# Patient Record
Sex: Female | Born: 1985 | Race: White | Hispanic: No | Smoking: Former smoker
Health system: Southern US, Community
[De-identification: ages and names within clinical notes are randomized; demographics above are authoritative.]

## PROBLEM LIST (undated history)

## (undated) DIAGNOSIS — O99119 Other diseases of the blood and blood-forming organs and certain disorders involving the immune mechanism complicating pregnancy, unspecified trimester: Secondary | ICD-10-CM

## (undated) DIAGNOSIS — D696 Thrombocytopenia, unspecified: Secondary | ICD-10-CM

## (undated) DIAGNOSIS — R4586 Emotional lability: Secondary | ICD-10-CM

## (undated) DIAGNOSIS — R569 Unspecified convulsions: Secondary | ICD-10-CM

## (undated) DIAGNOSIS — E079 Disorder of thyroid, unspecified: Secondary | ICD-10-CM

## (undated) DIAGNOSIS — E063 Autoimmune thyroiditis: Secondary | ICD-10-CM

## (undated) DIAGNOSIS — E039 Hypothyroidism, unspecified: Secondary | ICD-10-CM

## (undated) HISTORY — DX: Thrombocytopenia, unspecified: D69.6

## (undated) HISTORY — PX: NO PAST SURGERIES: SHX2092

## (undated) HISTORY — DX: Thrombocytopenia, unspecified: O99.119

## (undated) HISTORY — DX: Hypothyroidism, unspecified: E03.9

## (undated) HISTORY — DX: Autoimmune thyroiditis: E06.3

---

## 2005-11-06 ENCOUNTER — Emergency Department: Payer: Self-pay | Admitting: Emergency Medicine

## 2007-04-27 ENCOUNTER — Emergency Department: Payer: Self-pay | Admitting: Emergency Medicine

## 2007-08-13 ENCOUNTER — Emergency Department: Payer: Self-pay | Admitting: Internal Medicine

## 2010-03-14 ENCOUNTER — Emergency Department: Payer: Self-pay | Admitting: Emergency Medicine

## 2010-03-18 ENCOUNTER — Emergency Department: Payer: Self-pay | Admitting: Emergency Medicine

## 2010-07-29 ENCOUNTER — Ambulatory Visit: Payer: Self-pay | Admitting: Internal Medicine

## 2010-08-13 ENCOUNTER — Emergency Department: Payer: Self-pay | Admitting: Emergency Medicine

## 2011-02-15 ENCOUNTER — Ambulatory Visit: Payer: Self-pay | Admitting: Internal Medicine

## 2011-06-12 ENCOUNTER — Emergency Department: Payer: Self-pay | Admitting: Emergency Medicine

## 2011-12-12 ENCOUNTER — Inpatient Hospital Stay: Payer: Self-pay

## 2011-12-12 LAB — CBC WITH DIFFERENTIAL/PLATELET
Basophil %: 1.4 %
Eosinophil #: 0.1 10*3/uL (ref 0.0–0.7)
Eosinophil %: 1.1 %
HCT: 38.4 % (ref 35.0–47.0)
Lymphocyte #: 1.5 10*3/uL (ref 1.0–3.6)
MCH: 31.8 pg (ref 26.0–34.0)
MCHC: 34.4 g/dL (ref 32.0–36.0)
MCV: 92 fL (ref 80–100)
Monocyte #: 0.7 x10 3/mm (ref 0.2–0.9)
Monocyte %: 8.1 %
Neutrophil #: 6 10*3/uL (ref 1.4–6.5)
Platelet: 120 10*3/uL — ABNORMAL LOW (ref 150–440)
RBC: 4.17 10*6/uL (ref 3.80–5.20)
RDW: 13.3 % (ref 11.5–14.5)

## 2011-12-14 LAB — HEMATOCRIT: HCT: 31.3 % — ABNORMAL LOW (ref 35.0–47.0)

## 2013-02-16 ENCOUNTER — Emergency Department: Payer: Self-pay | Admitting: Emergency Medicine

## 2013-02-16 LAB — CBC
HCT: 39.7 % (ref 35.0–47.0)
HGB: 13.8 g/dL (ref 12.0–16.0)
MCHC: 34.8 g/dL (ref 32.0–36.0)
RBC: 4.59 10*6/uL (ref 3.80–5.20)
RDW: 12.7 % (ref 11.5–14.5)

## 2013-02-16 LAB — COMPREHENSIVE METABOLIC PANEL
Alkaline Phosphatase: 82 U/L (ref 50–136)
BUN: 14 mg/dL (ref 7–18)
Co2: 23 mmol/L (ref 21–32)
Creatinine: 0.89 mg/dL (ref 0.60–1.30)
EGFR (Non-African Amer.): >60
Glucose: 95 mg/dL (ref 65–99)
Potassium: 3.7 mmol/L (ref 3.5–5.1)
Sodium: 135 mmol/L — ABNORMAL LOW (ref 136–145)

## 2013-02-16 LAB — URINALYSIS, COMPLETE
Granular Cast: 3
RBC,UR: 16 /HPF (ref 0–5)
Specific Gravity: 1.02 (ref 1.003–1.030)
Squamous Epithelial: 10

## 2013-11-02 ENCOUNTER — Emergency Department: Payer: Self-pay | Admitting: Emergency Medicine

## 2013-11-02 LAB — URINALYSIS, COMPLETE
BILIRUBIN, UR: NEGATIVE
GLUCOSE, UR: NEGATIVE mg/dL (ref 0–75)
Ketone: NEGATIVE
NITRITE: NEGATIVE
Ph: 7 (ref 4.5–8.0)
Protein: NEGATIVE
SPECIFIC GRAVITY: 1.019 (ref 1.003–1.030)
WBC UR: 38 /HPF (ref 0–5)

## 2013-11-02 LAB — COMPREHENSIVE METABOLIC PANEL
ALK PHOS: 62 U/L
ANION GAP: 3 — AB (ref 7–16)
Albumin: 3.9 g/dL (ref 3.4–5.0)
BUN: 17 mg/dL (ref 7–18)
Bilirubin,Total: 0.4 mg/dL (ref 0.2–1.0)
CALCIUM: 8.6 mg/dL (ref 8.5–10.1)
CHLORIDE: 108 mmol/L — AB (ref 98–107)
CREATININE: 0.59 mg/dL — AB (ref 0.60–1.30)
Co2: 26 mmol/L (ref 21–32)
EGFR (African American): >60
Glucose: 90 mg/dL (ref 65–99)
OSMOLALITY: 275 (ref 275–301)
Potassium: 4.2 mmol/L (ref 3.5–5.1)
SGOT(AST): 24 U/L (ref 15–37)
SGPT (ALT): 25 U/L (ref 12–78)
SODIUM: 137 mmol/L (ref 136–145)
Total Protein: 8.2 g/dL (ref 6.4–8.2)

## 2013-11-02 LAB — CBC WITH DIFFERENTIAL/PLATELET
BASOS ABS: 0 10*3/uL (ref 0.0–0.1)
Basophil %: 0.7 %
EOS ABS: 0.1 10*3/uL (ref 0.0–0.7)
EOS PCT: 1.1 %
HCT: 39 % (ref 35.0–47.0)
HGB: 12.4 g/dL (ref 12.0–16.0)
LYMPHS PCT: 25.3 %
Lymphocyte #: 1.8 10*3/uL (ref 1.0–3.6)
MCH: 27.6 pg (ref 26.0–34.0)
MCHC: 31.9 g/dL — ABNORMAL LOW (ref 32.0–36.0)
MCV: 87 fL (ref 80–100)
Monocyte #: 0.5 x10 3/mm (ref 0.2–0.9)
Monocyte %: 7 %
Neutrophil #: 4.7 10*3/uL (ref 1.4–6.5)
Neutrophil %: 65.9 %
PLATELETS: 164 10*3/uL (ref 150–440)
RBC: 4.49 10*6/uL (ref 3.80–5.20)
RDW: 14.1 % (ref 11.5–14.5)
WBC: 7.2 10*3/uL (ref 3.6–11.0)

## 2013-11-04 LAB — URINE CULTURE

## 2014-01-01 ENCOUNTER — Emergency Department: Payer: Self-pay | Admitting: Emergency Medicine

## 2014-07-11 IMAGING — CT CT STONE STUDY
1 of 2 series · 11 of 16 positions shown, 14 images · non-contrast
Comparison: None

REASON FOR EXAM: left flank pain
COMMENTS:

PROCEDURE:     CT  - CT ABDOMEN /PELVIS WO (STONE)  - February 16, 2013  [DATE]
RESULT:     Indication: Flank Pain
TECHNIQUE: Multiple axial images from the lung bases to the symphysis pubis
were obtained without oral and without intravenous contrast.

[Series 2: soft tissue · axial · 0.70mm/px · z∈[-920,-532]mm · 11 of 155 slices shown, 14 images]
[im 13/155  soft-tissue]
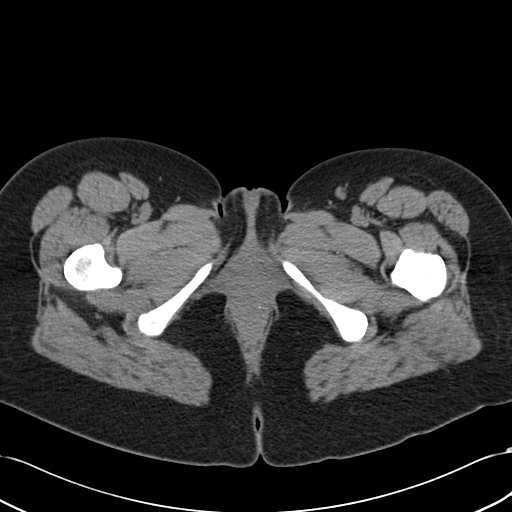
[im 13/155  bone]
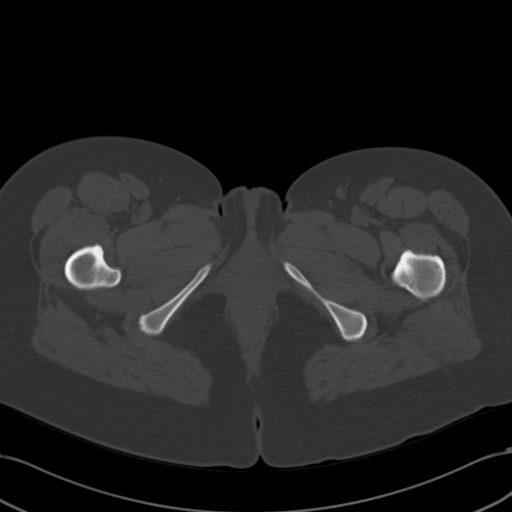
[im 26/155  soft-tissue]
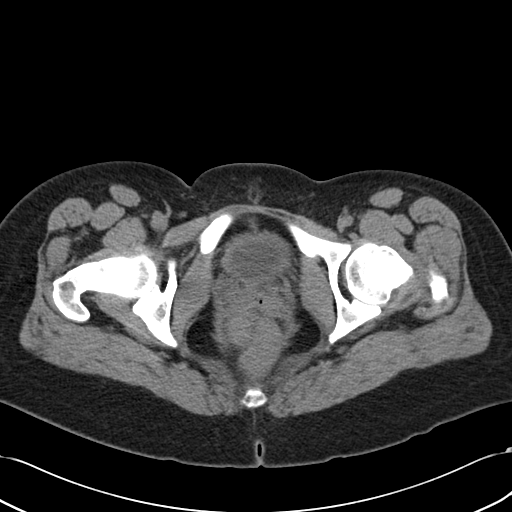
[im 39/155  soft-tissue]
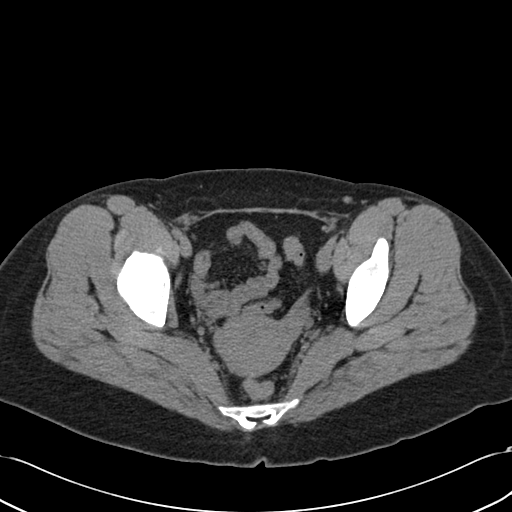
[im 52/155  soft-tissue]
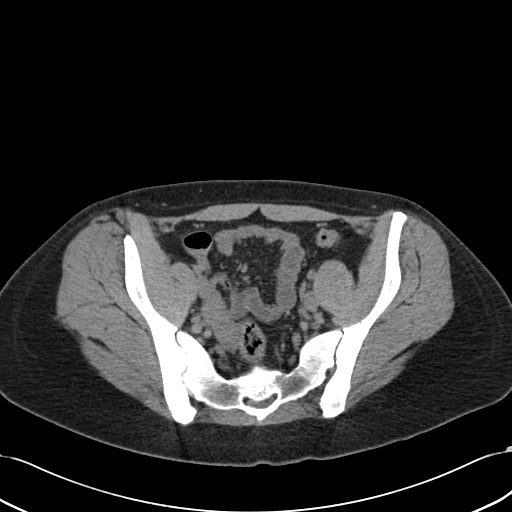
[im 65/155  soft-tissue]
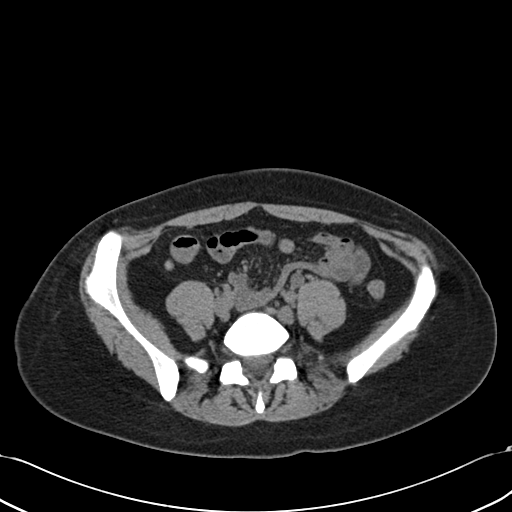
[im 65/155  bone]
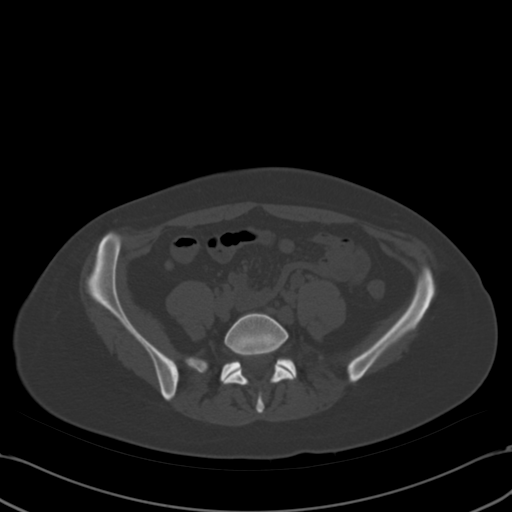
[im 78/155  soft-tissue]
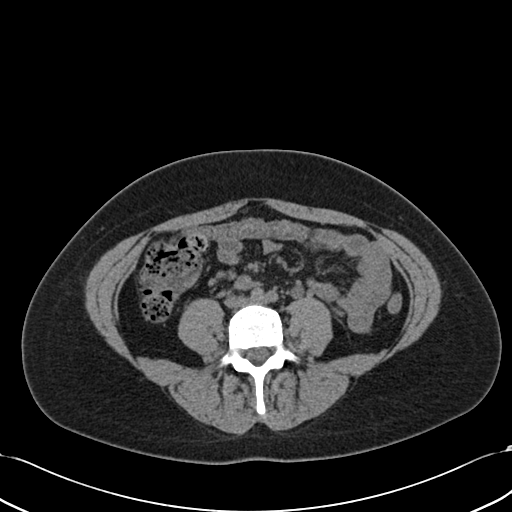
[im 90/155  soft-tissue]
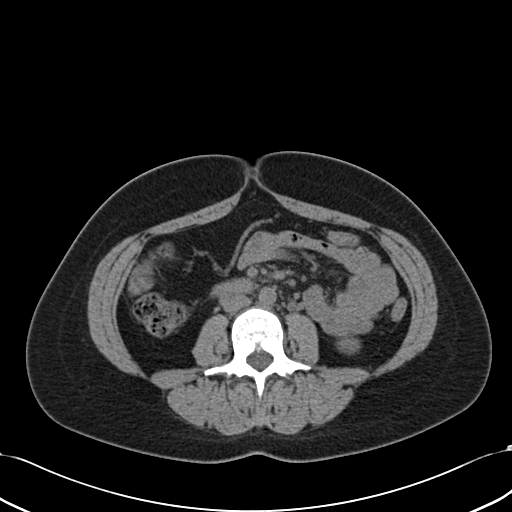
[im 103/155  soft-tissue]
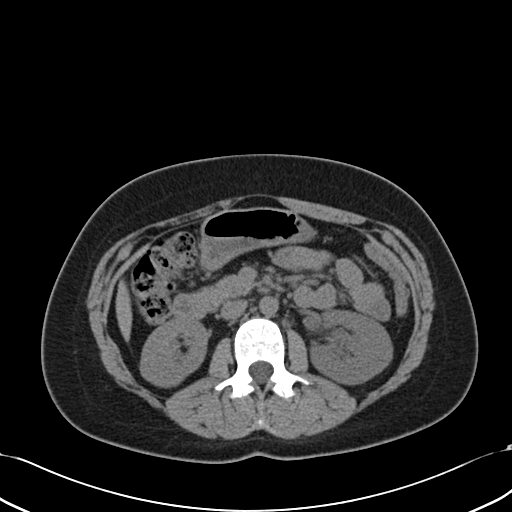
[im 116/155  soft-tissue]
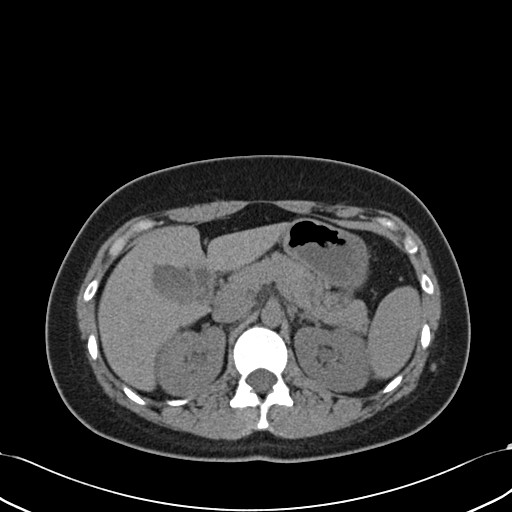
[im 116/155  bone]
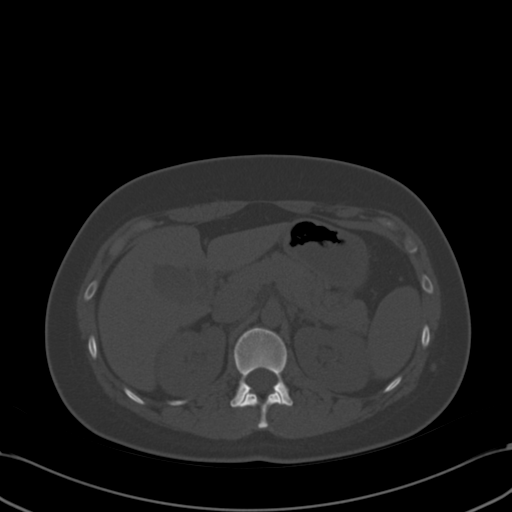
[im 129/155  soft-tissue]
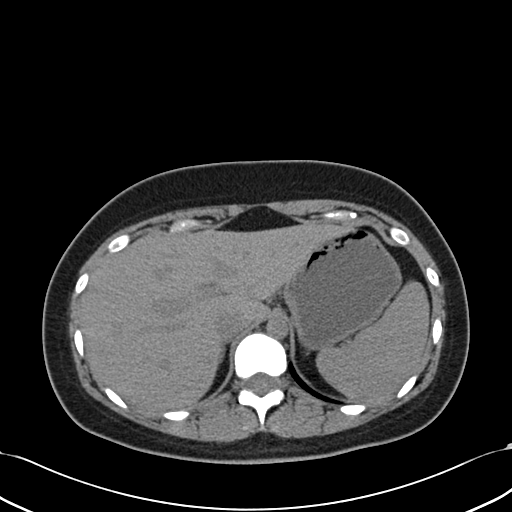
[im 142/155  soft-tissue]
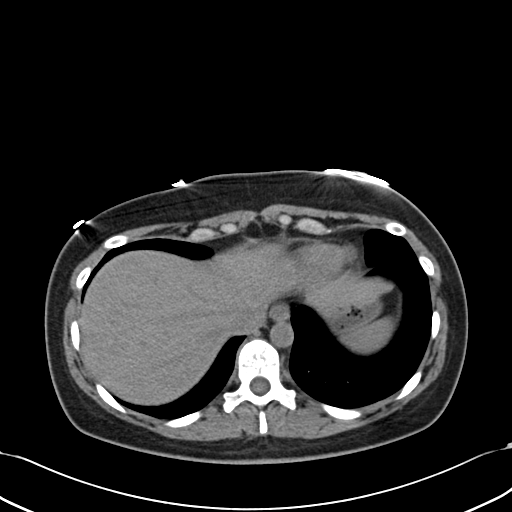

[11 of 16 positions shown; findings below may reference images not displayed]

FINDINGS: The lung bases are clear. There is no pleural or pericardial effusions.

There is a 2 mm left UVJ calculus resulting in mild left
hydroureteronephrosis. No perinephric stranding is seen. The kidneys are
symmetric in size without evidence for exophytic mass. The bladder is
unremarkable.

The liver demonstrates no focal abnormality. The gallbladder is
unremarkable. The spleen demonstrates no focal abnormality. The adrenal
glands and pancreas are normal.

The unopacified stomach, duodenum, small intestine, and large intestine are
unremarkable, but evaluation is limited by lack of oral contrast. There is a
normal caliber appendix in the right lower quadrant without periappendiceal
inflammatory changes. There is no pneumoperitoneum, pneumatosis, or portal
venous gas. There is no abdominal or pelvic free fluid. There is no
lymphadenopathy.

The abdominal aorta is normal in caliber .

The osseous structures are unremarkable.
IMPRESSION: 1. There is a 2 mm left UVJ calculus resulting in mild left
hydroureteronephrosis.

[REDACTED]

## 2014-12-03 NOTE — H&P (Signed)
L&D Evaluation:  History Expanded:   HPI 29 yo G2P0010 WF at 37 weeks w estimated date of confinement 12/27/11 in early labor, contractions.  No ROM or vaginal bleeding.    Gravida 2    Term 0    PreTerm 0    Abortion 1    Living 0    Blood Type B positive    Presents with contractions    Patient's Medical History No Chronic Illness    Patient's Surgical History none    Medications Pre Natal Vitamins    Allergies NKDA    Social History none    Family History Non-Contributory   ROS:   ROS All systems were reviewed.  HEENT, CNS, GI, GU, Respiratory, CV, Renal and Musculoskeletal systems were found to be normal.   Exam:   Vital Signs stable    General no apparent distress    Mental Status clear    Chest clear    Heart normal sinus rhythm    Abdomen gravid, non-tender    Estimated Fetal Weight Average for gestational age    Back no CVAT    Edema no edema    Pelvic no external lesions, 3/80    Mebranes Intact    Ucx regular    Skin dry   Impression:   Impression early labor   Plan:   Plan EFM/NST, monitor contractions and for cervical change   Electronic Signatures: Letitia LibraHarris, Klaryssa Fauth Paul (MD)  (Signed 19-May-13 15:20)  Authored: L&D Evaluation   Last Updated: 19-May-13 15:20 by Letitia LibraHarris, Doriann Zuch Paul (MD)

## 2014-12-07 ENCOUNTER — Ambulatory Visit
Admission: EM | Admit: 2014-12-07 | Discharge: 2014-12-07 | Disposition: A | Discharge status: Home / Self Care | Payer: Medicaid Other | Attending: Family Medicine | Admitting: Family Medicine

## 2014-12-07 ENCOUNTER — Encounter: Payer: Self-pay | Admitting: Gynecology

## 2014-12-07 DIAGNOSIS — S80861A Insect bite (nonvenomous), right lower leg, initial encounter: Secondary | ICD-10-CM | POA: Diagnosis not present

## 2014-12-07 DIAGNOSIS — W57XXXA Bitten or stung by nonvenomous insect and other nonvenomous arthropods, initial encounter: Secondary | ICD-10-CM | POA: Diagnosis not present

## 2014-12-07 HISTORY — DX: Unspecified convulsions: R56.9

## 2014-12-07 NOTE — ED Provider Notes (Signed)
CSN: [id number]     Arrival [id number] & time 12/07/14  1357 History   First MD Initiated Contact with Patient 12/07/14 1453     Chief Complaint  Patient presents with  . Insect Bite   (Consider location/radiation/quality/duration/timing/severity/associated sxs/prior Treatment) HPI     29 year old female presents complaining of a possible insect bite in the right popliteal space. She initially noticed a red bump about a week ago. This became red, swollen, and painful. 2 days ago started getting better. Now the rash is almost gone. No systemic symptoms. She says earlier there was a bruise with red bumps surrounding it, she did research on the Internet and thinks that she might have Lyme disease. Also she says her toes hurt from her tight shoes, she is wondering if that has anything to do with the insect bite  Past Medical History  Diagnosis Date  . Seizures     as a child   History reviewed. No pertinent past surgical history. No family history on file. History  Substance Use Topics  . Smoking status: Current Every Day Smoker    Types: Cigarettes  . Smokeless tobacco: Not on file  . Alcohol Use: No   OB History    No data available     Review of Systems  Skin: Positive for rash.  All other systems reviewed and are negative.   Allergies  Review of patient's allergies indicates no known allergies.  Home Medications   Prior to Admission medications   Medication Sig Start Date End Date Taking? Authorizing Provider  norgestimate-ethinyl estradiol (ORTHO-CYCLEN,SPRINTEC,PREVIFEM) 0.25-35 MG-MCG tablet Take 1 tablet by mouth daily.   Yes Historical Provider, MD   BP 115/78 mmHg  Pulse 98  Temp(Src) 97.9 F (36.6 C) (Tympanic)  Ht 5\' 3"  (1.6 m)  Wt 125 lb (56.7 kg)  BMI 22.15 kg/m2  SpO2 98%  LMP 11/21/2014 Physical Exam  Constitutional: She is oriented to person, place, and time. Vital signs are normal. She appears well-developed and well-nourished. No distress.  HENT:  Head:  Normocephalic and atraumatic.  Pulmonary/Chest: Effort normal. No respiratory distress.  Musculoskeletal:  Bilateral subungual hematomas of the great toes, unknown chronicity  Neurological: She is alert and oriented to person, place, and time. She has normal strength. Coordination normal.  Skin: Skin is warm and dry. Rash (in the right popliteal space there is a rash consisting of a few scabbed papules with a central 3 mm erythematous papule) noted. She is not diaphoretic.  Psychiatric: She has a normal mood and affect. Judgment normal.  Nursing note and vitals reviewed.   ED Course  Procedures (including critical care time) Labs Review Labs Reviewed - No data to display  Imaging Review No results found.   MDM   1. Insect bite of leg, right, initial encounter    This is an insect bite that is healing, no intervention indicated at this time. This does not have the appearance of erythema migrans. I do not think this has anything to do with toe pain but she is advised that she starts to have more bruising that she should come in to be evaluated. I have offered to send Borrelia antibodies but she declines. She will follow-up if the rash worsens or she starts to get sick in any way      Graylon GoodZachary H Laiken Sandy, PA-C 12/07/14 1650

## 2014-12-07 NOTE — Discharge Instructions (Signed)

## 2014-12-07 NOTE — ED Notes (Signed)
Patient c/o question insect bite on right posterior of patella x 1 week. Per pt. Itching / redness / rash.

## 2015-04-16 ENCOUNTER — Encounter: Payer: Self-pay | Admitting: Emergency Medicine

## 2015-04-16 ENCOUNTER — Emergency Department
Admission: EM | Admit: 2015-04-16 | Discharge: 2015-04-16 | Disposition: A | Discharge status: Home / Self Care | Payer: No Typology Code available for payment source | Attending: Emergency Medicine | Admitting: Emergency Medicine

## 2015-04-16 ENCOUNTER — Emergency Department: Payer: No Typology Code available for payment source

## 2015-04-16 DIAGNOSIS — Y92410 Unspecified street and highway as the place of occurrence of the external cause: Secondary | ICD-10-CM | POA: Diagnosis not present

## 2015-04-16 DIAGNOSIS — Z79899 Other long term (current) drug therapy: Secondary | ICD-10-CM | POA: Insufficient documentation

## 2015-04-16 DIAGNOSIS — Y998 Other external cause status: Secondary | ICD-10-CM | POA: Diagnosis not present

## 2015-04-16 DIAGNOSIS — S199XXA Unspecified injury of neck, initial encounter: Secondary | ICD-10-CM | POA: Diagnosis present

## 2015-04-16 DIAGNOSIS — S161XXA Strain of muscle, fascia and tendon at neck level, initial encounter: Secondary | ICD-10-CM | POA: Diagnosis not present

## 2015-04-16 DIAGNOSIS — S0990XA Unspecified injury of head, initial encounter: Secondary | ICD-10-CM | POA: Insufficient documentation

## 2015-04-16 DIAGNOSIS — Y9389 Activity, other specified: Secondary | ICD-10-CM | POA: Insufficient documentation

## 2015-04-16 DIAGNOSIS — Z720 Tobacco use: Secondary | ICD-10-CM | POA: Insufficient documentation

## 2015-04-16 MED ORDER — DIAZEPAM 2 MG PO TABS
2.0000 mg | ORAL_TABLET | Freq: Once | ORAL | Status: AC
  Administered 2015-04-16: 2 mg via ORAL
  Filled 2015-04-16: qty 1

## 2015-04-16 MED ORDER — DIAZEPAM 2 MG PO TABS: 2.0000 mg | ORAL_TABLET | Freq: Three times a day (TID) | ORAL | Status: DC | PRN

## 2015-04-16 MED ORDER — KETOROLAC TROMETHAMINE 60 MG/2ML IM SOLN
60.0000 mg | Freq: Once | INTRAMUSCULAR | Status: AC
  Administered 2015-04-16: 60 mg via INTRAMUSCULAR
  Filled 2015-04-16: qty 2

## 2015-04-16 MED ORDER — NAPROXEN 500 MG PO TABS: 500.0000 mg | ORAL_TABLET | Freq: Two times a day (BID) | ORAL | Status: DC
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
## 2015-04-16 NOTE — Discharge Instructions (Signed)
Follow up with orthopedics for symptoms that are not improving over the week. Return to the ER for symptoms that change or worsen if unable to schedule an appointment.   Cervical Sprain A cervical sprain is an injury in the neck in which the strong, fibrous tissues (ligaments) that connect your neck bones stretch or tear. Cervical sprains can range from mild to severe. Severe cervical sprains can cause the neck vertebrae to be unstable. This can lead to damage of the spinal cord and can result in serious nervous system problems. The amount of time it takes for a cervical sprain to get better depends on the cause and extent of the injury. Most cervical sprains heal in 1 to 3 weeks. CAUSES  Severe cervical sprains may be caused by:   Contact sport injuries (such as from football, rugby, wrestling, hockey, auto racing, gymnastics, diving, martial arts, or boxing).   Motor vehicle collisions.   Whiplash injuries. This is an injury from a sudden forward and backward whipping movement of the head and neck.  Falls.  Mild cervical sprains may be caused by:   Being in an awkward position, such as while cradling a telephone between your ear and shoulder.   Sitting in a chair that does not offer proper support.   Working at a poorly Marketing executive station.   Looking up or down for long periods of time.  SYMPTOMS   Pain, soreness, stiffness, or a burning sensation in the front, back, or sides of the neck. This discomfort may develop immediately after the injury or slowly, 24 hours or more after the injury.   Pain or tenderness directly in the middle of the back of the neck.   Shoulder or upper back pain.   Limited ability to move the neck.   Headache.   Dizziness.   Weakness, numbness, or tingling in the hands or arms.   Muscle spasms.   Difficulty swallowing or chewing.   Tenderness and swelling of the neck.  DIAGNOSIS  Most of the time your health care  provider can diagnose a cervical sprain by taking your history and doing a physical exam. Your health care provider will ask about previous neck injuries and any known neck problems, such as arthritis in the neck. X-rays may be taken to find out if there are any other problems, such as with the bones of the neck. Other tests, such as a CT scan or MRI, may also be needed.  TREATMENT  Treatment depends on the severity of the cervical sprain. Mild sprains can be treated with rest, keeping the neck in place (immobilization), and pain medicines. Severe cervical sprains are immediately immobilized. Further treatment is done to help with pain, muscle spasms, and other symptoms and may include:  Medicines, such as pain relievers, numbing medicines, or muscle relaxants.   Physical therapy. This may involve stretching exercises, strengthening exercises, and posture training. Exercises and improved posture can help stabilize the neck, strengthen muscles, and help stop symptoms from returning.  HOME CARE INSTRUCTIONS   Put ice on the injured area.   Put ice in a plastic bag.   Place a towel between your skin and the bag.   Leave the ice on for 15-20 minutes, 3-4 times a day.   If your injury was severe, you may have been given a cervical collar to wear. A cervical collar is a two-piece collar designed to keep your neck from moving while it heals.  Do not remove the collar unless instructed  by your health care provider.  If you have long hair, keep it outside of the collar.  Ask your health care provider before making any adjustments to your collar. Minor adjustments may be required over time to improve comfort and reduce pressure on your chin or on the back of your head.  Ifyou are allowed to remove the collar for cleaning or bathing, follow your health care provider's instructions on how to do so safely.  Keep your collar clean by wiping it with mild soap and water and drying it completely. If  the collar you have been given includes removable pads, remove them every 1-2 days and hand wash them with soap and water. Allow them to air dry. They should be completely dry before you wear them in the collar.  If you are allowed to remove the collar for cleaning and bathing, wash and dry the skin of your neck. Check your skin for irritation or sores. If you see any, tell your health care provider.  Do not drive while wearing the collar.   Only take over-the-counter or prescription medicines for pain, discomfort, or fever as directed by your health care provider.   Keep all follow-up appointments as directed by your health care provider.   Keep all physical therapy appointments as directed by your health care provider.   Make any needed adjustments to your workstation to promote good posture.   Avoid positions and activities that make your symptoms worse.   Warm up and stretch before being active to help prevent problems.  SEEK MEDICAL CARE IF:   Your pain is not controlled with medicine.   You are unable to decrease your pain medicine over time as planned.   Your activity level is not improving as expected.  SEEK IMMEDIATE MEDICAL CARE IF:   You develop any bleeding.  You develop stomach upset.  You have signs of an allergic reaction to your medicine.   Your symptoms get worse.   You develop new, unexplained symptoms.   You have numbness, tingling, weakness, or paralysis in any part of your body.  MAKE SURE YOU:   Understand these instructions.  Will watch your condition.  Will get help right away if you are not doing well or get worse. Document Released: 05/09/2007 Document Revised: 07/17/2013 Document Reviewed: 01/17/2013 [facility] Patient Information 2015 [employer], [place]. This information is not intended to replace advice given to you by your health care provider. Make sure you discuss any questions you have with your health care provider.

## 2015-04-16 NOTE — ED Notes (Signed)
Patient transported to CT 
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
## 2015-10-20 ENCOUNTER — Encounter: Payer: Self-pay | Admitting: Emergency Medicine

## 2015-10-20 ENCOUNTER — Ambulatory Visit
Admission: EM | Admit: 2015-10-20 | Discharge: 2015-10-20 | Disposition: A | Discharge status: Home / Self Care | Payer: Medicaid Other | Attending: Emergency Medicine | Admitting: Emergency Medicine

## 2015-10-20 DIAGNOSIS — R030 Elevated blood-pressure reading, without diagnosis of hypertension: Secondary | ICD-10-CM | POA: Diagnosis not present

## 2015-10-20 DIAGNOSIS — R51 Headache: Secondary | ICD-10-CM | POA: Diagnosis not present

## 2015-10-20 DIAGNOSIS — R519 Headache, unspecified: Secondary | ICD-10-CM

## 2015-10-20 MED ORDER — ORPHENADRINE CITRATE ER 100 MG PO TB12: 100.0000 mg | ORAL_TABLET | Freq: Two times a day (BID) | ORAL | Status: DC

## 2015-10-20 MED ORDER — BUTALBITAL-APAP-CAFFEINE 50-325-40 MG PO TABS
1.0000 | ORAL_TABLET | Freq: Four times a day (QID) | ORAL | Status: DC | PRN
Start: 2015-10-20 — End: 2016-06-07

## 2015-10-20 MED ORDER — AMOXICILLIN-POT CLAVULANATE 875-125 MG PO TABS: 1.0000 | ORAL_TABLET | Freq: Two times a day (BID) | ORAL | Status: DC

## 2015-10-20 NOTE — ED Notes (Signed)
Patient c/o HAs, neck pain and shoulder pain that started on Saturday.

## 2015-10-20 NOTE — ED Provider Notes (Signed)
CSN: [id number]     Arrival date & time 10/20/15  1330 History   First MD Initiated Contact with Patient 10/20/15 1548     Chief Complaint  Patient presents with  . Headache  . Neck Pain  . Shoulder Pain   (Consider location/radiation/quality/duration/timing/severity/associated sxs/prior Treatment) Patient is a 30 y.o. female presenting with headaches. The history is provided by the patient. No language interpreter was used.  Headache Pain location:  Occipital Quality: achy,throbbing. Radiates to: runs from back of head to front of head and down lateral aspect of neck, right side. Severity currently:  6/10 Severity at highest:  6/10 Onset quality:  Gradual Duration:  3 days Timing:  Constant Progression:  Unchanged Chronicity:  Recurrent Similar to prior headaches: yes   Context: activity and bright light   Relieved by:  Nothing Worsened by:  Activity and light Ineffective treatments:  Acetaminophen and resting in a darkened room Associated symptoms: congestion, neck pain, photophobia and sinus pressure   Associated symptoms: no abdominal pain, no back pain, no blurred vision, no cough, no diarrhea, no dizziness, no drainage, no ear pain, no eye pain, no facial pain, no fatigue, no fever, no focal weakness, no hearing loss, no loss of balance, no myalgias, no nausea, no near-syncope, no neck stiffness, no numbness, no paresthesias, no seizures, no sore throat, no swollen glands, no syncope, no tingling, no URI, no visual change, no vomiting and no weakness     Past Medical History  Diagnosis Date  . Seizures (HCC)     as a child   History reviewed. No pertinent past surgical history. History reviewed. No pertinent family history. Social History  Substance Use Topics  . Smoking status: Current Every Day Smoker    Types: Cigarettes  . Smokeless tobacco: None  . Alcohol Use: No   OB History    No data available     Review of Systems  Constitutional: Negative for fever  and fatigue.  HENT: Positive for congestion and sinus pressure. Negative for ear pain, hearing loss, postnasal drip and sore throat.   Eyes: Positive for photophobia. Negative for blurred vision, pain, discharge, redness, itching and visual disturbance.  Respiratory: Negative for cough.   Cardiovascular: Negative for syncope and near-syncope.  Gastrointestinal: Negative for nausea, vomiting, abdominal pain and diarrhea.  Endocrine: Negative.   Genitourinary: Negative.   Musculoskeletal: Positive for neck pain. Negative for myalgias, back pain, joint swelling, arthralgias, gait problem and neck stiffness.  Skin: Negative for rash.  Allergic/Immunologic: Negative.   Neurological: Positive for headaches. Negative for dizziness, tremors, focal weakness, seizures, syncope, facial asymmetry, speech difficulty, weakness, light-headedness, numbness, paresthesias and loss of balance.  Hematological: Negative.   Psychiatric/Behavioral: Negative.   All other systems reviewed and are negative.   Allergies  Review of patient's allergies indicates no known allergies.  Home Medications   Prior to Admission medications   Medication Sig Start Date End Date Taking? Authorizing Provider  amoxicillin-clavulanate (AUGMENTIN) 875-125 MG tablet Take 1 tablet by mouth every 12 (twelve) hours. 10/20/15   Clancy Gourd, NP  butalbital-acetaminophen-caffeine (FIORICET, ESGIC) [phone] MG tablet Take 1 tablet by mouth every 6 (six) hours as needed for headache. 10/20/15   Clancy Gourd, NP  norgestimate-ethinyl estradiol (ORTHO-CYCLEN,SPRINTEC,PREVIFEM) 0.25-35 MG-MCG tablet Take 1 tablet by mouth daily.    Historical Provider, MD  orphenadrine (NORFLEX) 100 MG tablet Take 1 tablet (100 mg total) by mouth 2 (two) times daily. 10/20/15   Clancy Gourd, NP   Meds Ordered and  Administered this Visit  Medications - No data to display  BP 138/95 mmHg  Pulse 64  Temp(Src) 98.2 F (36.8 C) (Oral)  Resp 16   Ht 5\' 3"  (1.6 m)  Wt 130 lb (58.968 kg)  BMI 23.03 kg/m2  SpO2 100%  LMP 10/03/2015 (Exact Date) No data found.   Physical Exam  Constitutional: She is oriented to person, place, and time. She appears well-developed and well-nourished. She is active and cooperative.  Non-toxic appearance. She does not have a sickly appearance. She does not appear ill. She appears distressed.  HENT:  Head: Normocephalic and atraumatic.  Right Ear: Tympanic membrane is retracted.  Left Ear: Tympanic membrane is retracted.  Nose: Mucosal edema present. Right sinus exhibits maxillary sinus tenderness. Left sinus exhibits maxillary sinus tenderness.  Mouth/Throat: Uvula is midline and mucous membranes are normal. Posterior oropharyngeal erythema present. No oropharyngeal exudate, posterior oropharyngeal edema or tonsillar abscesses.  Eyes: Conjunctivae, EOM and lids are normal. Pupils are equal, round, and reactive to light.  Neck: Normal range of motion. Neck supple. Muscular tenderness present. No tracheal deviation present. No thyromegaly present.    Area of +TTP noted, tight muscle bands. No cervical step offs  Cardiovascular: Normal rate, regular rhythm, normal heart sounds and normal pulses.   Pulmonary/Chest: Effort normal and breath sounds normal.  Musculoskeletal: Normal range of motion.  Lymphadenopathy:    She has no cervical adenopathy.  Neurological: She is alert and oriented to person, place, and time. She has normal strength. No cranial nerve deficit or sensory deficit. Gait normal. GCS eye subscore is 4. GCS verbal subscore is 5. GCS motor subscore is 6.  Skin: Skin is warm and dry. No rash noted.  Psychiatric: She has a normal mood and affect. Her speech is normal and behavior is normal. Judgment and thought content normal. Cognition and memory are normal.  Nursing note and vitals reviewed.   ED Course  Procedures (including critical care time)  Labs Review Labs Reviewed - No data to  display  Imaging Review No results found.       MDM   1. Headache disorder   2. Elevated blood pressure (not hypertension)    1630: offered pt Toradol injection in office, pt requesting script instead. No imaging performed as this is not worse HA of life, has hx of recurrent headaches, had neurology work up in distant past for seizure disorder as child, none since "grew out of it". Will [facility]ary [facility] for further HA/neurology evaluation-pt given card and contact info. Pt also given contact info for local PCP, [place], as she does not have one-to get established for general medical issues. Rest, take Norflex as directed, avoid caffeine, avoid excessive use of OTC meds d/t possible rebound HA issues. Pt verbalized understanding to this provider.    Clancy GourdJeanette Leontine Radman, NP 10/20/15 2259

## 2016-01-19 ENCOUNTER — Encounter: Payer: Self-pay | Admitting: Emergency Medicine

## 2016-01-19 ENCOUNTER — Emergency Department
Admission: EM | Admit: 2016-01-19 | Discharge: 2016-01-19 | Disposition: A | Discharge status: Home / Self Care | Payer: No Typology Code available for payment source | Attending: Emergency Medicine | Admitting: Emergency Medicine

## 2016-01-19 DIAGNOSIS — N3001 Acute cystitis with hematuria: Secondary | ICD-10-CM

## 2016-01-19 DIAGNOSIS — Z79899 Other long term (current) drug therapy: Secondary | ICD-10-CM | POA: Insufficient documentation

## 2016-01-19 DIAGNOSIS — F17210 Nicotine dependence, cigarettes, uncomplicated: Secondary | ICD-10-CM | POA: Insufficient documentation

## 2016-01-19 DIAGNOSIS — N3091 Cystitis, unspecified with hematuria: Secondary | ICD-10-CM | POA: Insufficient documentation

## 2016-01-19 DIAGNOSIS — Z8669 Personal history of other diseases of the nervous system and sense organs: Secondary | ICD-10-CM | POA: Insufficient documentation

## 2016-01-19 DIAGNOSIS — Z792 Long term (current) use of antibiotics: Secondary | ICD-10-CM | POA: Insufficient documentation

## 2016-01-19 LAB — POCT PREGNANCY, URINE: Preg Test, Ur: NEGATIVE

## 2016-01-19 MED ORDER — ONDANSETRON 4 MG PO TBDP
ORAL_TABLET | ORAL | Status: AC
  Administered 2016-01-19: 4 mg via ORAL
  Filled 2016-01-19: qty 1

## 2016-01-19 MED ORDER — CEFTRIAXONE SODIUM 1 G IJ SOLR
1.0000 g | Freq: Once | INTRAMUSCULAR | Status: AC
  Administered 2016-01-19: 1 g via INTRAMUSCULAR
  Filled 2016-01-19: qty 10

## 2016-01-19 MED ORDER — ONDANSETRON HCL 4 MG PO TABS: 4.0000 mg | ORAL_TABLET | Freq: Three times a day (TID) | ORAL | Status: DC | PRN

## 2016-01-19 MED ORDER — SULFAMETHOXAZOLE-TRIMETHOPRIM 800-160 MG PO TABS: 1.0000 | ORAL_TABLET | Freq: Two times a day (BID) | ORAL | Status: DC

## 2016-01-19 MED ORDER — ONDANSETRON 4 MG PO TBDP
4.0000 mg | ORAL_TABLET | Freq: Once | ORAL | Status: AC
  Administered 2016-01-19: 4 mg via ORAL

## 2016-01-19 MED ORDER — LIDOCAINE HCL (PF) 1 % IJ SOLN
2.1000 mL | Freq: Once | INTRAMUSCULAR | Status: AC
  Administered 2016-01-19: 2.1 mL
  Filled 2016-01-19: qty 5

## 2016-01-19 MED ORDER — PHENAZOPYRIDINE HCL 200 MG PO TABS: 200.0000 mg | ORAL_TABLET | Freq: Three times a day (TID) | ORAL | Status: DC | PRN

## 2016-01-19 NOTE — ED Notes (Addendum)
Pt in via triage with complaints of lower back pain x 2 days; pt reports pain with urination, frequency, urgency.  Pt reports hx of kidney stones.  Pt A/Ox3, no immediate distress at this time.

## 2016-01-19 NOTE — ED Provider Notes (Signed)
[facility] Emergency Department Provider Note  ____________________________________________  Time seen: Approximately 5:18 PM  I have reviewed the triage vital signs and the nursing notes.   HISTORY  Chief Complaint Back Pain and Flank Pain    HPI Maureen Hoffman is a 30 y.o. female who presents to the emergency department for evaluation of dysuria and low back pain that started last week. She states the symptoms have continually worsened and today she noticed blood with urination and began to feel nauseated. She has not taken anything for pain. She states her last kidney infection was about 3 years ago but had a kidney stone roughly a year and a half ago.  Past Medical History  Diagnosis Date  . Seizures (HCC)     as a child    There are no active problems to display for this patient.   History reviewed. No pertinent past surgical history.  Current Outpatient Rx  Name  Route  Sig  Dispense  Refill  . amoxicillin-clavulanate (AUGMENTIN) 875-125 MG tablet   Oral   Take 1 tablet by mouth every 12 (twelve) hours.   20 tablet   0   . butalbital-acetaminophen-caffeine (FIORICET, ESGIC) 50-325-40 MG tablet   Oral   Take 1 tablet by mouth every 6 (six) hours as needed for headache.   10 tablet   0   . norgestimate-ethinyl estradiol (ORTHO-CYCLEN,SPRINTEC,PREVIFEM) 0.25-35 MG-MCG tablet   Oral   Take 1 tablet by mouth daily.         . ondansetron (ZOFRAN) 4 MG tablet   Oral   Take 1 tablet (4 mg total) by mouth every 8 (eight) hours as needed for nausea or vomiting.   20 tablet   0   . orphenadrine (NORFLEX) 100 MG tablet   Oral   Take 1 tablet (100 mg total) by mouth 2 (two) times daily.   10 tablet   0   . phenazopyridine (PYRIDIUM) 200 MG tablet   Oral   Take 1 tablet (200 mg total) by mouth 3 (three) times daily as needed for pain.   9 tablet   0   . sulfamethoxazole-trimethoprim (BACTRIM DS,SEPTRA DS) 800-160 MG tablet    Oral   Take 1 tablet by mouth 2 (two) times daily.   6 tablet   0     Allergies Review of patient's allergies indicates no known allergies.  No family history on file.  Social History Social History  Substance Use Topics  . Smoking status: Current Every Day Smoker    Types: Cigarettes  . Smokeless tobacco: None  . Alcohol Use: No    Review of Systems Constitutional: Negative for fever. Respiratory: Negative for shortness of breath or cough. Gastrointestinal: Positive for abdominal pain; Positive for nausea , negative  for vomiting. Genitourinary: Positive for dysuria , negative for vaginal discharge. Musculoskeletal: Positive for back pain. Skin: Negative for wound, lesion, rash. ____________________________________________   PHYSICAL EXAM:  VITAL SIGNS: ED Triage Vitals  Enc Vitals Group     BP 01/19/16 1555 127/77 mmHg     Pulse Rate 01/19/16 1555 92     Resp 01/19/16 1555 16     Temp 01/19/16 1555 98.3 F (36.8 C)     Temp Source 01/19/16 1555 Oral     SpO2 01/19/16 1555 99 %     Weight 01/19/16 1555 130 lb (58.968 kg)     Height 01/19/16 1555 5\' 3"  (1.6 m)     Head Cir --  Peak Flow --      Pain Score 01/19/16 1556 5     Pain Loc --      Pain Edu? --      Excl. in GC? --     Constitutional: Alert and oriented. Acutely ill appearing and in no acute distress. Eyes: Conjunctivae are normal. PERRL. EOMI. Head: Atraumatic. Nose: No congestion/rhinnorhea. Mouth/Throat: Mucous membranes are moist. Respiratory: Normal respiratory effort.  No retractions. Gastrointestinal: Suprapubic tenderness on exam Genitourinary: Pelvic exam: deferred. Musculoskeletal: No extremity tenderness nor edema. CVA tenderness on the left. Neurologic:  Normal speech and language. No gross focal neurologic deficits are appreciated. Speech is normal. No gait instability. Skin:  Skin is warm, dry and intact. No rash noted. Psychiatric: Mood and affect are normal. Speech and  behavior are normal.  ____________________________________________   LABS (all labs ordered are listed, but only abnormal results are displayed)  Labs Reviewed  URINALYSIS COMPLETEWITH MICROSCOPIC ([facility] ONLY) - Abnormal; Notable for the following:    Color, Urine AMBER (*)    APPearance CLEAR (*)    Hgb urine dipstick 2+ (*)    Nitrite POSITIVE (*)    Leukocytes, UA 2+ (*)    All other components within normal limits  URINE CULTURE  POC URINE PREG, ED  POCT PREGNANCY, URINE   ____________________________________________  RADIOLOGY  Not indicated. ____________________________________________   PROCEDURES  Procedure(s) performed: None  ____________________________________________   INITIAL IMPRESSION / ASSESSMENT AND PLAN / ED COURSE  Pertinent labs & imaging results that were available during my care of the patient were reviewed by me and considered in my medical decision making (see chart for details).  IM Rocephin given in the ER.  Patient will be given prescriptions for Bactrim and Pyridium today. [facility] was [facility] that are not improving over the next few days. She was advised to return to the ER for symptoms that change or worsen if unable to schedule an appointment. ____________________________________________   FINAL CLINICAL IMPRESSION(S) / ED DIAGNOSES  Final diagnoses:  Acute cystitis with hematuria    Note:  This document was prepared using Dragon voice recognition software and may include unintentional dictation errors.   Chinita PesterCari B Nain Rudd, FNP 01/19/16 2009  Phineas SemenGraydon Goodman, MD 01/19/16 2053

## 2016-01-19 NOTE — ED Notes (Signed)
Pt reports lower back pain x3 days, reports radiates to front, lower abdomen with painful urination, hematuria and odor.

## 2016-01-19 NOTE — Discharge Instructions (Signed)

## 2016-01-21 LAB — URINE CULTURE: SPECIAL REQUESTS: NORMAL

## 2016-01-23 LAB — URINALYSIS COMPLETE WITH MICROSCOPIC (ARMC ONLY)
BILIRUBIN URINE: NEGATIVE
Bacteria, UA: NONE SEEN
GLUCOSE, UA: NEGATIVE mg/dL
Ketones, ur: NEGATIVE mg/dL
NITRITE: POSITIVE — AB
PROTEIN: NEGATIVE mg/dL
SPECIFIC GRAVITY, URINE: 1.009 (ref 1.005–1.030)
pH: 8 (ref 5.0–8.0)

## 2016-05-27 ENCOUNTER — Other Ambulatory Visit: Payer: Self-pay | Admitting: Obstetrics and Gynecology

## 2016-05-27 DIAGNOSIS — Z369 Encounter for antenatal screening, unspecified: Secondary | ICD-10-CM

## 2016-06-07 ENCOUNTER — Ambulatory Visit
Admission: RE | Admit: 2016-06-07 | Discharge: 2016-06-07 | Disposition: A | Discharge status: Home / Self Care | Payer: Medicaid Other | Source: Ambulatory Visit | Attending: Obstetrics and Gynecology | Admitting: Obstetrics and Gynecology

## 2016-06-07 ENCOUNTER — Ambulatory Visit (HOSPITAL_BASED_OUTPATIENT_CLINIC_OR_DEPARTMENT_OTHER)
Admission: RE | Admit: 2016-06-07 | Discharge: 2016-06-07 | Disposition: A | Discharge status: Home / Self Care | Payer: Medicaid Other | Source: Ambulatory Visit | Attending: Maternal and Fetal Medicine | Admitting: Maternal and Fetal Medicine

## 2016-06-07 ENCOUNTER — Encounter: Payer: Self-pay | Admitting: *Deleted

## 2016-06-07 DIAGNOSIS — Z3480 Encounter for supervision of other normal pregnancy, unspecified trimester: Secondary | ICD-10-CM | POA: Diagnosis not present

## 2016-06-07 DIAGNOSIS — Z369 Encounter for antenatal screening, unspecified: Secondary | ICD-10-CM | POA: Diagnosis not present

## 2016-06-07 DIAGNOSIS — Z3A13 13 weeks gestation of pregnancy: Secondary | ICD-10-CM | POA: Diagnosis not present

## 2016-06-07 NOTE — Progress Notes (Signed)
Ref[facility] physician[facility]  [facility] Length of Consultation: 30 minutes   Ms. Goodlin  was [facility] to [facility] for genetic counseling to review prenatal screening and testing options.  This note summarizes the information we discussed.    We offered the following routine screening tests for this pregnancy:  First trimester screening, which includes nuchal translucency ultrasound screen and first trimester maternal serum marker screening.  The nuchal translucency has approximately an 80% detection rate for Down syndrome and can be positive for other chromosome abnormalities as well as congenital heart defects.  When combined with a maternal serum marker screening, the detection rate is up to 90% for Down syndrome and up to 97% for trisomy 18.     Maternal serum marker screening, a blood test that measures pregnancy proteins, can provide risk assessments for Down syndrome, trisomy 18, and open neural tube defects (spina bifida, anencephaly). Because it does not directly examine the fetus, it cannot positively diagnose or rule out these problems.  Targeted ultrasound uses high frequency sound waves to create an image of the developing fetus.  An ultrasound is often recommended as a routine means of evaluating the pregnancy.  It is also used to screen for fetal anatomy problems (for example, a heart defect) that might be suggestive of a chromosomal or other abnormality.   Should these screening tests indicate an increased concern, then the following additional testing options would be offered:  The chorionic villus sampling procedure is available for first trimester chromosome analysis.  This involves the withdrawal of a small amount of chorionic villi (tissue from the developing placenta).  Risk of pregnancy loss is estimated to be approximately 1 in 200 to 1 in 100 (0.5 to 1%).  There is approximately a 1% (1 in 100) chance that the CVS chromosome results will be unclear.   Chorionic villi cannot be tested for neural tube defects.     Amniocentesis involves the removal of a small amount of amniotic fluid from the sac surrounding the fetus with the use of a thin needle inserted through the maternal abdomen and uterus.  Ultrasound guidance is used throughout the procedure.  Fetal cells from amniotic fluid are directly evaluated and > 99.5% of chromosome problems and > 98% of open neural tube defects can be detected. This procedure is generally performed after the 15th week of pregnancy.  The main risks to this procedure include complications leading to miscarriage in less than 1 in 200 cases (0.5%).  As another option for information if the pregnancy is suspected to be an an increased chance for certain chromosome conditions, we also reviewed the availability of cell free fetal DNA testing from maternal blood to determine whether or not the baby may have either Down syndrome, trisomy 5813, or trisomy 3218.  This test utilizes a maternal blood sample and DNA sequencing technology to isolate circulating cell free fetal DNA from maternal plasma.  The fetal DNA can then be analyzed for DNA sequences that are derived from the three most common chromosomes involved in aneuploidy, chromosomes 13, 18, and 21.  If the overall amount of DNA is greater than the expected level for any of these chromosomes, aneuploidy is suspected.  While we do not consider it a replacement for invasive testing and karyotype analysis, a negative result from this testing would be reassuring, though not a guarantee of a normal chromosome complement for the baby.  An abnormal result is certainly suggestive of an abnormal chromosome complement, though  we would still recommend CVS or amniocentesis to confirm any findings from this testing.   Cystic Fibrosis and Spinal Muscular Atrophy (SMA) screening were also discussed with the patient. Both conditions are recessive, which means that both parents must be carriers in  order to have a child with the disease.  Cystic fibrosis (CF) is one of the most common genetic conditions in persons of Caucasian ancestry.  This condition occurs in approximately 1 in 2,500 Caucasian persons and results in thickened secretions in the lungs, digestive, and reproductive systems.  For a baby to be at risk for having CF, both of the parents must be carriers for this condition.  Approximately 1 in 6725 Caucasian persons is a carrier for CF.  Current carrier testing looks for the most common mutations in the gene for CF and can detect approximately 90% of carriers in the Caucasian population.  This means that the carrier screening can greatly reduce, but cannot eliminate, the chance for an individual to have a child with CF.  If an individual is found to be a carrier for CF, then carrier testing would be available for the partner. As part of [place] newborn screening profile, all babies born in the state of [place] will have a two-tier screening process.  Specimens are first tested to determine the concentration of immunoreactive trypsinogen (IRT).  The top 5% of specimens with the highest IRT values then undergo DNA testing using a panel of over 40 common CF mutations. SMA is a neurodegenerative disorder that leads to atrophy of skeletal muscle and overall weakness.  This condition is also more prevalent in the Caucasian population, with 1 in 40-1 in 60 persons being a carrier and 1 in 6,000-1 in 10,000 children being affected.  There are multiple forms of the disease, with some causing death in infancy to other forms with survival into adulthood.  The genetics of SMA is complex, but carrier screening can detect up to 95% of carriers in the Caucasian population.  Similar to CF, a negative result can greatly reduce, but cannot eliminate, the chance to have a child with SMA.  We obtained a detailed family history and pregnancy history.  The family history was reported to be unremarkable  for birth defects, mental retardation, recurrent pregnancy loss or known chromosome abnormalities.  Ms. Maureen Hoffman stated that this is her third pregnancy.  The first was an elected termination for personal reasons.  She and her husband have a healthy 528 year old son.  She reported no complications or exposures in this pregnancy that would be expected to increase the risk for birth defects.  After consideration of the options, Ms. Goodlin elected to proceed with first trimester screening and to decline CF and SMA carrier testing.  An ultrasound was performed at the time of the visit.  The gestational age was consistent with  13 weeks.  Fetal anatomy could not be assessed due to early gestational age.  Please refer to the ultrasound report for details of that study.  Ms. Maureen Hoffman was encouraged to call with questions or concerns.  We can be contacted at [phone].    Cherly Andersoneborah F. Diron Haddon, MS, CGC

## 2016-06-10 ENCOUNTER — Telehealth: Payer: Self-pay | Admitting: Obstetrics and Gynecology

## 2016-06-10 NOTE — Telephone Encounter (Signed)
Ms. Maureen Hoffman  elected to undergo First Trimester screening on 06/07/2016.  To review, first trimester screening, includes nuchal translucency ultrasound screen and/or first trimester maternal serum marker screening.  The nuchal translucency has approximately an 80% detection rate for Down syndrome and can be positive for other chromosome abnormalities as well as heart defects.  When combined with a maternal serum marker screening, the detection rate is up to 90% for Down syndrome and up to 97% for trisomy 13 and 18.     The results of the First Trimester Nuchal Translucency and Biochemical Screening were within normal range.  The risk for Down syndrome is now estimated to be 1 in 3,785.  The risk for Trisomy 13/18 is less than 1 in 10,000  Should more definitive information be desired, we would offer amniocentesis.  Because we do not yet know the effectiveness of combined first and second trimester screening, we do not recommend a maternal serum screen to assess the chance for chromosome conditions.  However, if screening for neural tube defects is desired, maternal serum screening for AFP only can be performed between 15 and [redacted] weeks gestation.     Cherly Andersoneborah F. Dolores Mcgovern, MS, CGC

## 2016-06-22 LAB — OB RESULTS CONSOLE RUBELLA ANTIBODY, IGM: Rubella: IMMUNE

## 2016-06-22 LAB — OB RESULTS CONSOLE HEPATITIS B SURFACE ANTIGEN: HEP B S AG: NEGATIVE

## 2016-06-22 LAB — OB RESULTS CONSOLE VARICELLA ZOSTER ANTIBODY, IGG: Varicella: IMMUNE

## 2016-06-22 LAB — OB RESULTS CONSOLE HIV ANTIBODY (ROUTINE TESTING): HIV: NONREACTIVE

## 2016-07-26 NOTE — L&D Delivery Note (Addendum)
Delivery Note At 11:53 AM a  female Maureen Hoffman(Maureen Hoffman) was delivered via Vaginal, Spontaneous Delivery (Presentation:OA ;  ).  APGAR: 8, 9; weight 6 lb 13.7 oz (3110 g).  Vtx delivered OA and restituted by CNM to LOA with CAN farily snug but, was able to lift over the head and reduce it, Ant and post shoulders and body del at 11:53am. Pt was using Nitrous Oxide effectively prior to pushing. SDOP intact at 1155.  With 300 mls of EBL, FF and lochia mod. Then pt became very pale and shaky, O2 in use and BP attempted x 2 but, due to shaking all over, machine could not pick it up. Manual BP on Rt arm was 182/86. Pt's color improved on O2 and stated she felt ok. Baby was on mom's chest prior to the episode but, taken to the warmer until pt was stablilized. Bladder was distended and using prep, sterile st cath was inserted and bladder emptied of approx 800 mls. A 1st degree peri-urethral was repaired with 1% xylocaine and 3-0 CH on CT. Pt stablized and BP normalized.  VSS. Hemostasis achieved. Pitocin per IV drip at rapid rate pp.  Placenta status: no abruption noted, no missing cotylyedons.    Labor anesthesia: Nitrous Oxide which helped pt immensley Anesthesia:  1% local for repair Episiotomy: None Lacerations: 1st degree;Periurethral Suture Repair: 3-0 CH on CT Est. Blood Loss (mL): 300  Mom to PP care. .  Baby to mom's chest.   Sharee Pimplearon W Jones 12/04/2016, 12:36 PM

## 2016-11-15 LAB — OB RESULTS CONSOLE GC/CHLAMYDIA
Chlamydia: NEGATIVE
Gonorrhea: NEGATIVE

## 2016-11-15 LAB — OB RESULTS CONSOLE GBS: GBS: NEGATIVE

## 2016-11-15 LAB — OB RESULTS CONSOLE RPR: RPR: NONREACTIVE

## 2016-12-01 ENCOUNTER — Other Ambulatory Visit: Payer: Self-pay | Admitting: Obstetrics and Gynecology

## 2016-12-03 ENCOUNTER — Inpatient Hospital Stay
Admission: EM | Admit: 2016-12-03 | Discharge: 2016-12-06 | DRG: 775 | Disposition: A | Discharge status: Home / Self Care | Payer: Medicaid Other | Attending: Obstetrics and Gynecology | Admitting: Obstetrics and Gynecology

## 2016-12-03 DIAGNOSIS — Z6790 Unspecified blood type, Rh positive: Secondary | ICD-10-CM | POA: Diagnosis not present

## 2016-12-03 DIAGNOSIS — O99119 Other diseases of the blood and blood-forming organs and certain disorders involving the immune mechanism complicating pregnancy, unspecified trimester: Secondary | ICD-10-CM

## 2016-12-03 DIAGNOSIS — O0993 Supervision of high risk pregnancy, unspecified, third trimester: Secondary | ICD-10-CM

## 2016-12-03 DIAGNOSIS — D696 Thrombocytopenia, unspecified: Secondary | ICD-10-CM

## 2016-12-03 DIAGNOSIS — O9912 Other diseases of the blood and blood-forming organs and certain disorders involving the immune mechanism complicating childbirth: Principal | ICD-10-CM | POA: Diagnosis present

## 2016-12-03 DIAGNOSIS — Z87891 Personal history of nicotine dependence: Secondary | ICD-10-CM

## 2016-12-03 DIAGNOSIS — Z3A38 38 weeks gestation of pregnancy: Secondary | ICD-10-CM | POA: Diagnosis not present

## 2016-12-03 DIAGNOSIS — Z3490 Encounter for supervision of normal pregnancy, unspecified, unspecified trimester: Secondary | ICD-10-CM | POA: Diagnosis present

## 2016-12-03 HISTORY — DX: Thrombocytopenia, unspecified: D69.6

## 2016-12-03 HISTORY — DX: Thrombocytopenia, unspecified: O99.119

## 2016-12-03 LAB — TYPE AND SCREEN
ABO/RH(D): B POS
Antibody Screen: NEGATIVE

## 2016-12-03 LAB — CBC
HCT: 35.8 % (ref 35.0–47.0)
Hemoglobin: 12.5 g/dL (ref 12.0–16.0)
MCH: 31.4 pg (ref 26.0–34.0)
MCHC: 34.8 g/dL (ref 32.0–36.0)
MCV: 90.4 fL (ref 80.0–100.0)
PLATELETS: 93 10*3/uL — AB (ref 150–440)
RBC: 3.97 MIL/uL (ref 3.80–5.20)
RDW: 15 % — AB (ref 11.5–14.5)
WBC: 9.3 10*3/uL (ref 3.6–11.0)

## 2016-12-03 MED ORDER — ACETAMINOPHEN 325 MG PO TABS: 650.0000 mg | ORAL_TABLET | ORAL | Status: DC | PRN

## 2016-12-03 MED ORDER — OXYTOCIN 40 UNITS IN LACTATED RINGERS INFUSION - SIMPLE MED
2.5000 [IU]/h | INTRAVENOUS | Status: DC
  Administered 2016-12-04: 2.5 [IU]/h via INTRAVENOUS

## 2016-12-03 MED ORDER — LACTATED RINGERS IV SOLN: 500.0000 mL | INTRAVENOUS | Status: DC | PRN

## 2016-12-03 MED ORDER — BUTORPHANOL TARTRATE 1 MG/ML IJ SOLN
1.0000 mg | INTRAMUSCULAR | Status: DC | PRN
  Administered 2016-12-04: 1 mg via INTRAVENOUS
  Filled 2016-12-03: qty 2

## 2016-12-03 MED ORDER — OXYCODONE-ACETAMINOPHEN 5-325 MG PO TABS: 2.0000 | ORAL_TABLET | ORAL | Status: DC | PRN

## 2016-12-03 MED ORDER — ONDANSETRON HCL 4 MG/2ML IJ SOLN: 4.0000 mg | Freq: Four times a day (QID) | INTRAMUSCULAR | Status: DC | PRN

## 2016-12-03 MED ORDER — OXYCODONE-ACETAMINOPHEN 5-325 MG PO TABS: 1.0000 | ORAL_TABLET | ORAL | Status: DC | PRN

## 2016-12-03 MED ORDER — LIDOCAINE HCL (PF) 1 % IJ SOLN
30.0000 mL | INTRAMUSCULAR | Status: AC | PRN
  Administered 2016-12-04: 30 mL via SUBCUTANEOUS

## 2016-12-03 MED ORDER — TERBUTALINE SULFATE 1 MG/ML IJ SOLN
0.2500 mg | Freq: Once | INTRAMUSCULAR | Status: DC | PRN
  Filled 2016-12-03: qty 1

## 2016-12-03 MED ORDER — LACTATED RINGERS IV SOLN
INTRAVENOUS | Status: DC
  Administered 2016-12-03: 21:00:00 via INTRAVENOUS

## 2016-12-03 MED ORDER — OXYTOCIN BOLUS FROM INFUSION
500.0000 mL | Freq: Once | INTRAVENOUS | Status: AC
  Administered 2016-12-04: 500 mL via INTRAVENOUS

## 2016-12-03 MED ORDER — SOD CITRATE-CITRIC ACID 500-334 MG/5ML PO SOLN
30.0000 mL | ORAL | Status: DC | PRN
  Filled 2016-12-03: qty 30

## 2016-12-03 MED ORDER — MISOPROSTOL 25 MCG QUARTER TABLET
50.0000 ug | ORAL_TABLET | ORAL | Status: DC
  Administered 2016-12-03: 50 ug via VAGINAL
  Filled 2016-12-03: qty 2

## 2016-12-03 MED ORDER — TERBUTALINE SULFATE 1 MG/ML IJ SOLN
0.2500 mg | Freq: Once | INTRAMUSCULAR | Status: AC | PRN
  Administered 2016-12-04: 0.25 mg via SUBCUTANEOUS
  Filled 2016-12-03: qty 1

## 2016-12-03 MED ORDER — ZOLPIDEM TARTRATE 5 MG PO TABS: 5.0000 mg | ORAL_TABLET | Freq: Every evening | ORAL | Status: DC | PRN

## 2016-12-03 NOTE — H&P (Signed)
Maureen Hoffman is a 31 y.o. female G3P1011 at 38+4wks presenting for iol for thrombocytopenia with plts trending down. Plts 93 on admission.  OB History    Gravida Para Term Preterm AB Living   3         1   SAB TAB Ectopic Multiple Live Births                 Past Medical History:  Diagnosis Date  . Seizures (HCC)    as a child   History reviewed. No pertinent surgical history. Family History: family history is not on file. Social History:  reports that she has quit smoking. Her smoking use included Cigarettes. She has never used smokeless tobacco. She reports that she does not drink alcohol or use drugs.     Maternal Diabetes: No Genetic Screening: Normal Maternal Ultrasounds/Referrals: Normal Fetal Ultrasounds or other Referrals:  None Maternal Substance Abuse:  No Significant Maternal Medications:  None Significant Maternal Lab Results:  Lab values include: Other:  Other Comments:  plts 100 prior to admission  Review of Systems  Constitutional: Negative for chills and fever.  Eyes: Negative for blurred vision and double vision.  Respiratory: Negative for shortness of breath.   Cardiovascular: Negative for chest pain and palpitations.  Gastrointestinal: Negative for abdominal pain, constipation, diarrhea, nausea and vomiting.  Genitourinary: Negative for dysuria, flank pain, frequency and urgency.  Neurological: Negative for headaches.  Psychiatric/Behavioral: Negative for depression.   Maternal Medical History:  Reason for admission: Nausea.      Blood pressure 120/68, pulse 94, temperature 98.1 F (36.7 C), temperature source Oral, resp. rate 18, height 5\' 3"  (1.6 m), weight 185 lb (83.9 kg), last menstrual period 03/08/2016. Exam Physical Exam  Constitutional: She is oriented to person, place, and time. She appears well-developed and well-nourished. No distress.  Eyes: No scleral icterus.  Neck: Normal range of motion. Neck supple.  Cardiovascular: Normal  rate.   Respiratory: Effort normal. No respiratory distress.  GI: Soft. She exhibits no distension. There is no tenderness.  Genitourinary: Vagina normal and uterus normal.  Musculoskeletal: Normal range of motion.  Neurological: She is alert and oriented to person, place, and time.  Skin: Skin is warm and dry.  Psychiatric: She has a normal mood and affect.    Prenatal labs: ABO, Rh:  B pos Antibody:  neg Rubella:  immune RPR:   neg HBsAg:   neg HIV:   neg GBS:   neg  Assessment/Plan: Last US: 07/21/16 single, Viable IUP, S=D FHT=141bpm, Bilat OVS Imaged, CX Closed= 4.6cm SEX: FEMALE  US:08/31/16: AFI 15cms,(55%), Post placenta  ASSESSMENT AND PLAN:  Admit for induction labor Labs pending Epidural when desired if anesthesia willing Continuous fetal monitoring   1. Fetal Well being  - Fetal Tracing: Cat II with variable decels - Ultrasound:  reviewed, as above - Group B Streptococcus:neg - Presentation: vtx confirmed by sutures   2. Routine OB: - Prenatal labs reviewed, as above - Rh positive  3. Induction of Labor:  -  Contractions external toco in place -  Plan for induction with cytotec  4. Post Partum Planning: - Infant feeding: breast - Contraception: OCPs    Maureen Hoffman 12/03/2016, 9:53 PM

## 2016-12-04 DIAGNOSIS — Z3490 Encounter for supervision of normal pregnancy, unspecified, unspecified trimester: Secondary | ICD-10-CM | POA: Diagnosis present

## 2016-12-04 MED ORDER — SIMETHICONE 80 MG PO CHEW: 80.0000 mg | CHEWABLE_TABLET | ORAL | Status: DC | PRN

## 2016-12-04 MED ORDER — BENZOCAINE-MENTHOL 20-0.5 % EX AERO: 1.0000 "application " | INHALATION_SPRAY | CUTANEOUS | Status: DC | PRN

## 2016-12-04 MED ORDER — AMMONIA AROMATIC IN INHA
RESPIRATORY_TRACT | Status: AC
  Filled 2016-12-04: qty 10

## 2016-12-04 MED ORDER — ACETAMINOPHEN 325 MG PO TABS: 650.0000 mg | ORAL_TABLET | ORAL | Status: DC | PRN

## 2016-12-04 MED ORDER — DIBUCAINE 1 % RE OINT: 1.0000 "application " | TOPICAL_OINTMENT | RECTAL | Status: DC | PRN

## 2016-12-04 MED ORDER — ONDANSETRON HCL 4 MG/2ML IJ SOLN
4.0000 mg | INTRAMUSCULAR | Status: DC | PRN
  Administered 2016-12-05: 4 mg via INTRAVENOUS
  Filled 2016-12-04: qty 2

## 2016-12-04 MED ORDER — BUTORPHANOL TARTRATE 2 MG/ML IJ SOLN: 1.0000 mg | INTRAMUSCULAR | Status: DC | PRN

## 2016-12-04 MED ORDER — MISOPROSTOL 200 MCG PO TABS
ORAL_TABLET | ORAL | Status: AC
  Filled 2016-12-04: qty 4

## 2016-12-04 MED ORDER — IBUPROFEN 600 MG PO TABS
ORAL_TABLET | ORAL | Status: AC
  Filled 2016-12-04: qty 1

## 2016-12-04 MED ORDER — SODIUM CHLORIDE 0.9% FLUSH
3.0000 mL | Freq: Two times a day (BID) | INTRAVENOUS | Status: DC
  Administered 2016-12-05 – 2016-12-06 (×2): 3 mL via INTRAVENOUS

## 2016-12-04 MED ORDER — WITCH HAZEL-GLYCERIN EX PADS: 1.0000 "application " | MEDICATED_PAD | CUTANEOUS | Status: DC | PRN

## 2016-12-04 MED ORDER — OXYTOCIN 10 UNIT/ML IJ SOLN
INTRAMUSCULAR | Status: AC
  Filled 2016-12-04: qty 2

## 2016-12-04 MED ORDER — ZOLPIDEM TARTRATE 5 MG PO TABS: 5.0000 mg | ORAL_TABLET | Freq: Every evening | ORAL | Status: DC | PRN

## 2016-12-04 MED ORDER — SODIUM CHLORIDE 0.9 % IJ SOLN
INTRAMUSCULAR | Status: AC
Start: 2016-12-04 — End: 2016-12-04
  Filled 2016-12-04: qty 50

## 2016-12-04 MED ORDER — TERBUTALINE SULFATE 1 MG/ML IJ SOLN: 0.2500 mg | Freq: Once | INTRAMUSCULAR | Status: DC | PRN

## 2016-12-04 MED ORDER — SENNOSIDES-DOCUSATE SODIUM 8.6-50 MG PO TABS
2.0000 | ORAL_TABLET | ORAL | Status: DC
  Administered 2016-12-04 – 2016-12-06 (×2): 2 via ORAL
  Filled 2016-12-04 (×2): qty 2

## 2016-12-04 MED ORDER — ONDANSETRON HCL 4 MG PO TABS: 4.0000 mg | ORAL_TABLET | ORAL | Status: DC | PRN

## 2016-12-04 MED ORDER — SODIUM CHLORIDE 0.9% FLUSH: 3.0000 mL | INTRAVENOUS | Status: DC | PRN

## 2016-12-04 MED ORDER — BISACODYL 10 MG RE SUPP: 10.0000 mg | Freq: Every day | RECTAL | Status: DC | PRN

## 2016-12-04 MED ORDER — MEASLES, MUMPS & RUBELLA VAC ~~LOC~~ INJ: 0.5000 mL | INJECTION | Freq: Once | SUBCUTANEOUS | Status: DC

## 2016-12-04 MED ORDER — DIPHENHYDRAMINE HCL 25 MG PO CAPS: 25.0000 mg | ORAL_CAPSULE | Freq: Four times a day (QID) | ORAL | Status: DC | PRN

## 2016-12-04 MED ORDER — SODIUM CHLORIDE 0.9 % IV SOLN: 250.0000 mL | INTRAVENOUS | Status: DC | PRN

## 2016-12-04 MED ORDER — PRENATAL MULTIVITAMIN CH
1.0000 | ORAL_TABLET | Freq: Every day | ORAL | Status: DC
  Administered 2016-12-04 – 2016-12-05 (×2): 1 via ORAL
  Filled 2016-12-04 (×3): qty 1

## 2016-12-04 MED ORDER — IBUPROFEN 600 MG PO TABS
600.0000 mg | ORAL_TABLET | Freq: Four times a day (QID) | ORAL | Status: DC
  Administered 2016-12-04 – 2016-12-06 (×8): 600 mg via ORAL
  Filled 2016-12-04 (×8): qty 1

## 2016-12-04 MED ORDER — OXYTOCIN 40 UNITS IN LACTATED RINGERS INFUSION - SIMPLE MED
1.0000 m[IU]/min | INTRAVENOUS | Status: DC
  Administered 2016-12-04: 1 m[IU]/min via INTRAVENOUS
  Filled 2016-12-04: qty 1000

## 2016-12-04 MED ORDER — TETANUS-DIPHTH-ACELL PERTUSSIS 5-2.5-18.5 LF-MCG/0.5 IM SUSP: 0.5000 mL | Freq: Once | INTRAMUSCULAR | Status: DC

## 2016-12-04 MED ORDER — FLEET ENEMA 7-19 GM/118ML RE ENEM: 1.0000 | ENEMA | Freq: Every day | RECTAL | Status: DC | PRN

## 2016-12-04 MED ORDER — LIDOCAINE HCL (PF) 1 % IJ SOLN
INTRAMUSCULAR | Status: AC
  Filled 2016-12-04: qty 30

## 2016-12-04 MED ORDER — COCONUT OIL OIL
1.0000 "application " | TOPICAL_OIL | Status: DC | PRN
  Administered 2016-12-04: 1 via TOPICAL
  Filled 2016-12-04: qty 120

## 2016-12-04 NOTE — Progress Notes (Signed)
S:. Hurting with UC's. Pt knows she cannot get an epidural and so she is hesitant to have her water broken but, she has agreed. No Vb, UC's are hurting.  O: Vitals:   12/04/16 0804 12/04/16 0839 12/04/16 1023 12/04/16 1024  BP:  118/74  (!) 115/56  Pulse:  91  79  Resp:   20   Temp:   98.1 F (36.7 C)   TempSrc:   Oral   Weight: 185 lb (83.9 kg)     Height: 5\' 3"  (1.6 m)        Gen: NAD, AAOx3      Abd: FNTTP      Ext: Non-tender, Nonedmeatous    FHT: + mod var + accelerations no decelerations TOCO: Q min SVE:5/90%/vtx-2 1048: AROM for clear fluid and mucus plug expelled. Mod amt with FHR of 145 after AROM.   A/P:  31 y.o. yo G3P0011 at 8157w5d for  IOL due to thrombocytopenia.   Labor: progressing on PItocin 2 mun/min  FWB: Reassuring Cat 1 tracing. EFW &#8oz  GBS: Negative  Continue to labor and will order Nitrous Oxide for pain control   Sharee Pimplearon W Odysseus Cada 10:52 AM

## 2016-12-04 NOTE — Progress Notes (Signed)
Maureen Hoffman is a 31 y.o. G3P0011 at 842w5d with thrombocytopenia, with Cat II strip after cytotec x1 given at 2230. Hyperstim overnight, now s/p terb x1 with FHT recovery.   Subjective: Feeling contractions mildly  Objective: BP 117/72 (BP Location: Left Arm)   Pulse 75   Temp 99.2 F (37.3 C) (Oral)   Resp 18   Ht 5\' 3"  (1.6 m)   Wt 185 lb (83.9 kg)   LMP 03/08/2016   BMI 32.77 kg/m  No intake/output data recorded. Total I/O In: 880.4 [P.O.:120; I.V.:760.4] Out: -   FHT:  FHR: 140 bpm, variability: moderate,  accelerations:  Present,  decelerations:  Absent UC:   regular, every 2-4 minutes SVE:   Dilation: 3 Effacement (%): 60 Station: -2 Exam by:: Maureen ManifoldB. Merikay Lesniewski, MD  Labs: Lab Results  Component Value Date   WBC 9.3 12/03/2016   HGB 12.5 12/03/2016   HCT 35.8 12/03/2016   MCV 90.4 12/03/2016   PLT 93 (L) 12/03/2016    Assessment / Plan: Discussion of Cat II strip with repetitive late decels, now resolved with terb and conservative measures. Contractions mild but present. If FHT trending down, might proceed with c/s for fetal intolerance to labor. However, no distress at this moment (strip is a reassuring Cat I now) and she is doing well. Will start small pitocin as cervix unchanged with mild contractions, and monitor FHT closely. Pt and family in agreement. Consider AROM when clinically indicated.  Maureen DouglasBethany Janeli Hoffman 12/04/2016, 6:10 AM

## 2016-12-04 NOTE — Discharge Instructions (Signed)
Care After Vaginal Delivery °Congratulations on your new baby!! ° °Refer to this sheet in the next few weeks. These discharge instructions provide you with information on caring for yourself after delivery. Your caregiver may also give you specific instructions. Your treatment has been planned according to the most current medical practices available, but problems sometimes occur. Call your caregiver if you have any problems or questions after you go home. ° °HOME CARE INSTRUCTIONS °· Take over-the-counter or prescription medicines only as directed by your caregiver or pharmacist. °· Do not drink alcohol, especially if you are breastfeeding or taking medicine to relieve pain. °· Do not chew or smoke tobacco. °· Do not use illegal drugs. °· Continue to use good perineal care. Good perineal care includes: °¨ Wiping your perineum from front to back. °¨ Keeping your perineum clean. °· Do not use tampons or douche until your caregiver says it is okay. °· Shower, wash your hair, and take tub baths as directed by your caregiver. °· Wear a well-fitting bra that provides breast support. °· Eat healthy foods. °· Drink enough fluids to keep your urine clear or pale yellow. °· Eat high-fiber foods such as whole grain cereals and breads, brown rice, beans, and fresh fruits and vegetables every day. These foods may help prevent or relieve constipation. °· Follow your caregiver's recommendations regarding resumption of activities such as climbing stairs, driving, lifting, exercising, or traveling. Specifically, no driving for two weeks, so that you are comfortable reacting quickly in an emergency. °· Talk to your caregiver about resuming sexual activities. Resumption of sexual activities is dependent upon your risk of infection, your rate of healing, and your comfort and desire to resume sexual activity. Usually we recommend waiting about six weeks, or until your bleeding stops and you are interested in sex. °· Try to have someone  help you with your household activities and your newborn for at least a few days after you leave the hospital. Even longer is better. °· Rest as much as possible. Try to rest or take a nap when your newborn is sleeping. Sleep deprivation can be very hard after delivery. °· Increase your activities gradually. °· Keep all of your scheduled postpartum appointments. It is very important to keep your scheduled follow-up appointments. At these appointments, your caregiver will be checking to make sure that you are healing physically and emotionally. ° °SEEK MEDICAL CARE IF:  °· You are passing large clots from your vagina.  °· You have a foul smelling discharge from your vagina. °· You have trouble urinating. °· You are urinating frequently. °· You have pain when you urinate. °· You have a change in your bowel movements. °· You have increasing redness, pain, or swelling near your vaginal incision (episiotomy) or vaginal tear. °· You have pus draining from your episiotomy or vaginal tear. °· Your episiotomy or vaginal tear is separating. °· You have painful, hard, or reddened breasts. °· You have a severe headache. °· You have blurred vision or see spots. °· You feel sad or depressed. °· You have thoughts of hurting yourself or your newborn. °· You have questions about your care, the care of your newborn, or medicines. °· You are dizzy or light-headed. °· You have a rash. °· You have nausea or vomiting. °· You were breastfeeding and have not had a menstrual period within 12 weeks after you stopped breastfeeding. °· You are not breastfeeding and have not had a menstrual period by the 12th week after delivery. °· You   have a fever. ° °SEEK IMMEDIATE MEDICAL CARE IF:  °· You have persistent pain. °· You have chest pain. °· You have shortness of breath. °· You faint. °· You have leg pain. °· You have stomach pain. °· Your vaginal bleeding saturates two or more sanitary pads in 1 hour. ° °MAKE SURE YOU:  °· Understand these  instructions. °· Will get help right away if you are not doing well or get worse. °·  °Document Released: 07/09/2000 Document Revised: 11/26/2013 Document Reviewed: 03/08/2012 ° °ExitCare® Patient Information ©2015 [employer]. This information is not intended to replace advice given to you by your health care provider. Make sure you discuss any questions you have with your health care provider. ° °

## 2016-12-04 NOTE — Discharge Summary (Signed)
Obstetric Discharge Summary   Patient ID: Maureen Hoffman MRN: [id number] DOB/AGE: 12/11/1985 31 y.o.   Date of Admission: 12/03/2016  Date of Discharge:12/06/16  Admitting Diagnosis:IUP at 109w5d Secondary Diagnosis: Thrombocytopenia   Mode of Delivery: NSVD Discharge Diagnosis: NSVD of viable female with 1st deg Rt periurerthal    Intrapartum Procedures: IOL, AROM, Ext fetal and uterine monitors, Nitrous Oxide   Post partum procedures: None  Complications: Low Platelets necessitating IOL at 93,000 at term   Maureen Hoffman a [id number] had a SVD on 12/04/16;  for further details of this delivery, please refer to the delivery note.  Patient had an uncomplicated postpartum course.  By time of discharge on PPD#2, her pain was controlled on oral pain medications; she had appropriate lochia and was ambulating, voiding without difficulty and tolerating regular diet.  She was deemed stable for discharge to home.  Platelet transfusion and dexamethasone .given . Nadir plts57K. D/C plt 67K  Labs: CBC Latest Ref Rng & Units 12/06/2016 12/05/2016 12/05/2016  WBC 3.6 - 11.0 K/uL - - -  Hemoglobin 12.0 - 16.0 g/dL - - -  Hematocrit 35.0 - 47.0 % - - -  Platelets 150 - 440 K/uL 67(L) 60(L) 57(L)   Recent Results (from the past 2160 hour(s))  OB RESULT CONSOLE Group B Strep     Status: None   Collection Time: 11/15/16 12:00 AM  Result Value Ref Range   GBS Negative   OB RESULTS CONSOLE GC/Chlamydia     Status: None   Collection Time: 11/15/16 12:00 AM  Result Value Ref Range   Gonorrhea Negative    Chlamydia Negative   OB RESULTS CONSOLE RPR     Status: None   Collection Time: 11/15/16 12:00 AM  Result Value Ref Range   RPR Nonreactive   CBC     Status: Abnormal   Collection Time: 12/03/16  9:04 PM  Result Value Ref Range   WBC 9.3 3.6 - 11.0 K/uL   RBC 3.97 3.80 - 5.20 MIL/uL   Hemoglobin 12.5 12.0 - 16.0 g/dL   HCT 35.8 35.0 - 47.0 %   MCV 90.4 80.0 -  100.0 fL   MCH 31.4 26.0 - 34.0 pg   MCHC 34.8 32.0 - 36.0 g/dL   RDW 15.0 (H) 11.5 - 14.5 %   Platelets 93 (L) 150 - 440 K/uL  Type and screen [place]    Status: None   Collection Time: 12/03/16  9:04 PM  Result Value Ref Range   ABO/RH(D) B POS    Antibody Screen NEG    Sample Expiration 12/06/2016   RPR     Status: None   Collection Time: 12/03/16  9:04 PM  Result Value Ref Range   RPR Ser Ql Non Reactive Non Reactive    Comment: (NOTE) Performed At: [place] [id number] Romp[place] PZH:[id number]  CBC     Status: Abnormal   Collection Time: 12/05/16  6:23 AM  Result Value Ref Range   WBC 12.5 (H) 3.6 - 11.0 K/uL   RBC 3.14 (L) 3.80 - 5.20 MIL/uL   Hemoglobin 9.8 (L) 12.0 - 16.0 g/dL   HCT 28.3 (L) 35.0 - 47.0 %   MCV 90.3 80.0 - 100.0 fL   MCH 31.3 26.0 - 34.0 pg   MCHC 34.6 32.0 - 36.0 g/dL   RDW 15.0 (H) 11.5 - 14.5 %  Platelets 58 (L) 150 - 440 K/uL  Prepare Pheresed Platelets     Status: None (Preliminary result)   Collection Time: 12/05/16  8:19 AM  Result Value Ref Range   Unit Number [id number]    Blood Component Type PLTPHER LR1    Unit division 00    Status of Unit ALLOCATED    Transfusion Status OK TO TRANSFUSE   BPAM Platelet Pheresis     Status: None (Preliminary result)   Collection Time: 12/05/16  8:19 AM  Result Value Ref Range   Blood Product Unit Number [id number]    Unit Type and Rh 5100    Blood Product Expiration Date [id number]   Reticulocytes     Status: Abnormal   Collection Time: 12/05/16 10:06 AM  Result Value Ref Range   Retic Ct Pct 3.5 (H) 0.4 - 3.1 %   RBC. 2.95 (L) 3.80 - 5.20 MIL/uL   Retic Count, Manual 103.3 19.0 - 183.0 K/uL  TSH     Status: None   Collection Time: 12/05/16 10:06 AM  Result Value Ref Range   TSH 4.177 0.350 - 4.500 uIU/mL    Comment: Performed by a 3rd Generation assay with a functional sensitivity of <=0.01 uIU/mL.   Protime-INR     Status: None   Collection Time: 12/05/16 10:06 AM  Result Value Ref Range   Prothrombin Time 12.6 11.4 - 15.2 seconds   INR 0.94   Fibrinogen     Status: None   Collection Time: 12/05/16 10:06 AM  Result Value Ref Range   Fibrinogen 341 210 - 475 mg/dL  Fibrin Degradation Products ([facility] only)     Status: Abnormal   Collection Time: 12/05/16 10:06 AM  Result Value Ref Range   Fibrin Degradation Prod. >40 (A) <10    Comment: CRITICAL RESULT CALLED TO, READ BACK BY AND VERIFIED WITH: KIARA THOMAS ON 12/05/16 AT 1057 MNS CORRECTED ON 05/13 AT 1102: PREVIOUSLY REPORTED AS >40 CRITICAL RESULT CALLED TO, READ BACK BY AND VERIFIED WITH: Phineas Real THOMAS ON 12/05/16 AT 1057 MNS   Comprehensive metabolic panel     Status: Abnormal   Collection Time: 12/05/16 10:06 AM  Result Value Ref Range   Sodium 137 135 - 145 mmol/L   Potassium 3.4 (L) 3.5 - 5.1 mmol/L   Chloride 107 101 - 111 mmol/L   CO2 22 22 - 32 mmol/L   Glucose, Bld 147 (H) 65 - 99 mg/dL   BUN 14 6 - 20 mg/dL   Creatinine, Ser 0.77 0.44 - 1.00 mg/dL   Calcium 9.0 8.9 - 10.3 mg/dL   Total Protein 5.5 (L) 6.5 - 8.1 g/dL   Albumin 2.5 (L) 3.5 - 5.0 g/dL   AST 43 (H) 15 - 41 U/L   ALT 22 14 - 54 U/L   Alkaline Phosphatase 77 38 - 126 U/L   Total Bilirubin 0.9 0.3 - 1.2 mg/dL   GFR calc non Af Amer >60 >60 mL/min   GFR calc Af Amer >60 >60 mL/min    Comment: (NOTE) The eGFR has been calculated using the CKD EPI equation. This calculation has not been validated in all clinical situations. eGFR's persistently <60 mL/min signify possible Chronic Kidney Disease.    Anion gap 8 5 - 15  Platelet count     Status: Abnormal   Collection Time: 12/05/16 10:11 AM  Result Value Ref Range   Platelets 57 (L) 150 - 440 K/uL  Platelet count     Status: Abnormal  Collection Time: 12/05/16 12:55 PM  Result Value Ref Range   Platelets 60 (L) 150 - 440 K/uL  Platelet count     Status: Abnormal   Collection Time: 12/06/16  6:03  AM  Result Value Ref Range   Platelets 67 (L) 150 - 440 K/uL   B POS  Physical exam:  Blood pressure 125/72, pulse 64, temperature 98.1 F (36.7 C), temperature source Oral, resp. rate 18, height '5\' 3"'  (1.6 m), weight 185 lb (83.9 kg), last menstrual period 03/08/2016, SpO2 100 %, unknown if currently breastfeeding. General: alert and no distress Lochia: appropriate Abdomen: soft, NT Uterine Fundus: firm Rt periurethral area is C/D/I and healing Extremities: No evidence of DVT seen on physical exam. No lower extremity edema.  Discharge Instructions: Per After Visit Summary. Activity: Advance as tolerated. Pelvic rest for 6 weeks.  Also refer to After Visit Summary Diet: Regular Medications:Dexamethasone 40 mg daily x 3 days  Vicodin , no NSAIDs ,  Micronor  Outpatient follow up:  Follow-up Information    Schermerhorn, Gwen Her, MD Follow up in 1 week(s).   Specialty:  Obstetrics and Gynecology Why:  follow up for thrombocytopenia Contact information: [address] [place] [address] [phone]         6 weeks with CJones, CNM at [facility] Postpartum contraception: OCP's   Discharged Condition: STable   Discharged CH:ENID   Newborn Data:  Baby Boy named "Cassius"  Disposition:Home with mom  Apgars: APGAR (1 MIN): 8   APGAR (5 MINS): 9   APGAR (10 MINS):    Baby Feeding: Breast   Boykin Nearing, MD 12/06/2016

## 2016-12-04 NOTE — Progress Notes (Signed)
Pt using nitrous oxide for pain management during labor at this time.  Pt rating pain 10/10 on pain scale prior to initiation.  Pt verbalizes understanding of instructions for use with face mask.

## 2016-12-05 LAB — COMPREHENSIVE METABOLIC PANEL
ALK PHOS: 77 U/L (ref 38–126)
ALT: 22 U/L (ref 14–54)
ANION GAP: 8 (ref 5–15)
AST: 43 U/L — ABNORMAL HIGH (ref 15–41)
Albumin: 2.5 g/dL — ABNORMAL LOW (ref 3.5–5.0)
BILIRUBIN TOTAL: 0.9 mg/dL (ref 0.3–1.2)
BUN: 14 mg/dL (ref 6–20)
CO2: 22 mmol/L (ref 22–32)
CREATININE: 0.77 mg/dL (ref 0.44–1.00)
Calcium: 9 mg/dL (ref 8.9–10.3)
Chloride: 107 mmol/L (ref 101–111)
Glucose, Bld: 147 mg/dL — ABNORMAL HIGH (ref 65–99)
Potassium: 3.4 mmol/L — ABNORMAL LOW (ref 3.5–5.1)
SODIUM: 137 mmol/L (ref 135–145)
TOTAL PROTEIN: 5.5 g/dL — AB (ref 6.5–8.1)

## 2016-12-05 LAB — CBC
HCT: 28.3 % — ABNORMAL LOW (ref 35.0–47.0)
HEMOGLOBIN: 9.8 g/dL — AB (ref 12.0–16.0)
MCH: 31.3 pg (ref 26.0–34.0)
MCHC: 34.6 g/dL (ref 32.0–36.0)
MCV: 90.3 fL (ref 80.0–100.0)
Platelets: 58 10*3/uL — ABNORMAL LOW (ref 150–440)
RBC: 3.14 MIL/uL — AB (ref 3.80–5.20)
RDW: 15 % — ABNORMAL HIGH (ref 11.5–14.5)
WBC: 12.5 10*3/uL — ABNORMAL HIGH (ref 3.6–11.0)

## 2016-12-05 LAB — PROTIME-INR
INR: 0.94
PROTHROMBIN TIME: 12.6 s (ref 11.4–15.2)

## 2016-12-05 LAB — TSH: TSH: 4.177 u[IU]/mL (ref 0.350–4.500)

## 2016-12-05 LAB — PLATELET COUNT
PLATELETS: 57 10*3/uL — AB (ref 150–440)
PLATELETS: 60 10*3/uL — AB (ref 150–440)

## 2016-12-05 LAB — RPR: RPR Ser Ql: NONREACTIVE

## 2016-12-05 LAB — RETICULOCYTES
RBC.: 2.95 MIL/uL — ABNORMAL LOW (ref 3.80–5.20)
RETIC CT PCT: 3.5 % — AB (ref 0.4–3.1)
Retic Count, Absolute: 103.3 10*3/uL (ref 19.0–183.0)

## 2016-12-05 LAB — FIBRIN DEGRADATION PROD.(ARMC ONLY): Fibrin Degradation Prod.: >40 — AB (ref <10)

## 2016-12-05 LAB — FIBRINOGEN: Fibrinogen: 341 mg/dL (ref 210–475)

## 2016-12-05 MED ORDER — SODIUM CHLORIDE 0.9 % IV SOLN: Freq: Once | INTRAVENOUS | Status: DC

## 2016-12-05 MED ORDER — DEXAMETHASONE 4 MG PO TABS
40.0000 mg | ORAL_TABLET | Freq: Every day | ORAL | Status: DC
  Administered 2016-12-05 – 2016-12-06 (×2): 40 mg via ORAL
  Filled 2016-12-05 (×2): qty 10

## 2016-12-05 NOTE — Progress Notes (Signed)
Ordered platelets to be drawn at 12:30 Lab canceled this future order and added it on to previously drawn CBC.     Spoke to lab, and asked them to draw AT 12:30. They said they would fix the order, but the other one has already been resulted.  ----- Ranae Plumberhelsea Ward, MD Attending Obstetrician and Gynecologist [facility], Department of OB/GYN [facility]

## 2016-12-05 NOTE — Progress Notes (Addendum)
Assuming care of patient.  Had thrombocytopenia dx'd in pregnancy at 28wks - plt @ NOB (06/22/16) was 173, in 2015 164, 2014 157. On admission PLT 93, then post delivery 58.  Likely cause of her 3rd trimester drop is gestational thrombocytopenia, but comprehensive testing has been ordered to evaluate for other dysfunction that could be contributing.  She is not preeclamptic currently, nor exhibiting signs of DIC.  Upon literature review, q6-12hr repeat draw is recommended.  If ITP, mainstay of therapy is high-dose steroids.   Platelet transfusion has been already ordered, but I will not transfuse until a repeat plt order is confirmed.  Scheduled for 6h after first draw.  Will base next steps on result:  Either observation, high-dose steroid tx, and/or plt transfusion.  Discussed at length with patient's family; patient was sleeping in room during this time and not attempted to be aroused.    ----- Ranae Plumberhelsea Alamin Mccuiston, MD Attending Obstetrician and Gynecologist [facility], Department of OB[facility]GYN [facility]    12:08 Discussed with patient.  She agrees with plan.  ----- Ranae Plumberhelsea Duey Liller, MD Attending Obstetrician and Gynecologist [facility], Department of OB/GYN [facility]

## 2016-12-05 NOTE — Progress Notes (Signed)
Late entry, from 2pm  Patient's platelets returned at 60. Starting daily dexamethasone 40mg  x 4 days.  Will repeat with morning rounds.  ----- Ranae Plumberhelsea Kiegan Macaraeg, MD Attending Obstetrician and Gynecologist [facility], Department of OB/GYN [facility]

## 2016-12-05 NOTE — Progress Notes (Signed)
Post Partum Day 1 Subjective: "I really want to go home today"  Objective: Blood pressure 123/67, pulse 71, temperature 97.9 F (36.6 C), temperature source Oral, resp. rate 18, height 5\' 3"  (1.6 m), weight 185 lb (83.9 kg), last menstrual period 03/08/2016, SpO2 100 %, unknown if currently breastfeeding.  Physical Exam:  General: A,A&O x 3 Heart: S1S2, RRR, NO M/R/G Lungs: CTA bilat, no W/R/R. Lochia: mod, no clots. No active uncontrolled bleeding noted Uterine Fundus:FF,  Perineum: intact, no bleeding from the Rt periurethral site with 2 interupted sutures DVT Evaluation:Neg Homans   Recent Labs  12/03/16 2104 12/05/16 0623  HGB 12.5 9.8*  HCT 35.8 28.3*  WBC 9.3 12.5*  PLT 93* 58*    Assessment/Plan: A:1. PPD#1 2. Low Platelets unknown cause P; 1. Disc with Dr Dalbert GarnetBeasley and she ordered platelets (1 unit 6-10 packs). Enroute to be here within an hour. 2. Disc lower platelets with pt and will do multiple tests according to the "2013 Clinical Guide on Thrombocytopenia in Pregnancy guidelines". Pt did not receive any thrombocytopenia lab testing in pregnancy as she was scheduled immediately for delivery with PLT's of 93,000. Will draw today and analyze if pt has underlying hematologic issues. Can refer pt to Hematology pp for further workup if needed. 3. Pt advised she cannot go home today due to low Plts and need to evaluate further. 4. Dr Elesa MassedWard will be updated for continuity of care re: pt status.    LOS: 2 days   Maureen Hoffman 12/05/2016, 9:24 AM

## 2016-12-05 NOTE — Lactation Note (Signed)
This note was copied from a baby's chart. Mom had given bottles all night.  When questioned mom about her plans to continue breast feeding, mom reports only wanting to pump for now.  Her first baby had problems with latching and ended up with jaundice.  First baby had tight frenulum and this baby as well.  Mom reports FOB also had frenulum issues.  Discussed management and treatment options.  Mom's nipples are tender. Mom has small wide spaced breasts with bulbar areola that points downward.  Mom reports pumping only with first baby for 2 months and had plenty of milk for baby.  Plans to pump longer with this baby and leaving option of putting Cassius to breast open for now. Assisted mom with breast massage and hand expressing and drops given to baby.  Mom is committed to pumping bilaterally every 3 hours.  Mom has lactation name and number and encouraged to call for questions, concerns or assistance.

## 2016-12-06 LAB — PATHOLOGIST SMEAR REVIEW

## 2016-12-06 LAB — ANTINUCLEAR ANTIBODIES, IFA: ANTINUCLEAR ANTIBODIES, IFA: NEGATIVE

## 2016-12-06 LAB — PLATELET COUNT: Platelets: 67 10*3/uL — ABNORMAL LOW (ref 150–440)

## 2016-12-06 LAB — HEPATITIS C ANTIBODY

## 2016-12-06 MED ORDER — BENZOCAINE-MENTHOL 20-0.5 % EX AERO: 1.0000 "application " | INHALATION_SPRAY | CUTANEOUS | 1 refills | Status: DC | PRN

## 2016-12-06 MED ORDER — DEXAMETHASONE 4 MG PO TABS: 40.0000 mg | ORAL_TABLET | Freq: Every day | ORAL | 0 refills | Status: AC

## 2016-12-06 MED ORDER — HYDROCODONE-ACETAMINOPHEN 5-325 MG PO TABS: 1.0000 | ORAL_TABLET | Freq: Four times a day (QID) | ORAL | 0 refills | Status: DC | PRN

## 2016-12-06 MED ORDER — NORETHINDRONE 0.35 MG PO TABS: 1.0000 | ORAL_TABLET | Freq: Every day | ORAL | 11 refills | Status: DC

## 2016-12-06 NOTE — Progress Notes (Signed)
Reviewed all patients discharge instructions and handouts regarding postpartum bleeding, no intercourse for 6 weeks, signs and symptoms of mastitis and postpartum bleu's. Reviewed discharge instructions for newborn regarding proper cord care, how and when to bathe the newborn, nail care, proper way to take the baby's temperature, along with safe sleep. All questions have been answered at this time. Patient discharged via wheelchair with axillary.  

## 2016-12-07 LAB — BPAM PLATELET PHERESIS
BLOOD PRODUCT EXPIRATION DATE: 201805141142
Unit Type and Rh: 5100

## 2016-12-07 LAB — PREPARE PLATELET PHERESIS: UNIT DIVISION: 0

## 2016-12-07 LAB — H PYLORI, IGM, IGG, IGA AB
H. Pylogi, Iga Abs: <9 units (ref 0.0–8.9)
H. Pylogi, Igm Abs: <9 units (ref 0.0–8.9)

## 2016-12-08 LAB — DIRECT PLATELET ANTIBODY
PLT ASSOC. ANTI-IB/IX: NEGATIVE
PLT ASSOC. ANTI-IIB/IIIA: NEGATIVE
Plt Assoc. Anti-IA/IIA: NEGATIVE

## 2016-12-09 LAB — ANTIPHOSPHOLIPID SYNDROME EVAL, BLD
DRVVT: 34.1 s (ref 0.0–47.0)
PHOSPHATYDALSERINE, IGA: 1 {APS'U} (ref 0–20)
PTT LA: 35 s (ref 0.0–51.9)
Phosphatydalserine, IgG: 5 GPS IgG (ref 0–11)
Phosphatydalserine, IgM: 2 MPS IgM (ref 0–25)

## 2016-12-09 LAB — IMMUNOGLOBULINS A/E/G/M, SERUM
IGA: 136 mg/dL (ref 87–352)
IGM, SERUM: 21 mg/dL — AB (ref 26–217)
IgE (Immunoglobulin E), Serum: 25 IU/mL (ref 0–100)
IgG (Immunoglobin G), Serum: 642 mg/dL — ABNORMAL LOW (ref 700–1600)

## 2016-12-10 LAB — VON WILLEBRAND FACTOR MULTIMER

## 2016-12-13 ENCOUNTER — Inpatient Hospital Stay: Admission: AD | Admit: 2016-12-13 | Payer: Medicaid Other | Source: Home / Self Care

## 2017-11-07 IMAGING — US US MFM OB COMPLETE +14 WKS
1 series · 13 of 28 positions shown · non-contrast
Comparison: none

PATIENT INFO:

PERFORMED BY:
SERVICE(S) PROVIDED:
US MFM OB COMP LESS THAN 14 WEEKS                     76801.4
INDICATIONS:
13 weeks gestation of pregnancy
First trimester screen
FETAL EVALUATION:
Num Of Fetuses:     1
Yolk Sac:
Fetal Heart         163
Rate(bpm):
Presentation:       Transverse, head to maternal left
Placenta:           Posterior
BIOMETRY:
CRL:      64.2  mm     G. Age:  12w 6d                  EDD:    12/14/16
GESTATIONAL AGE:
LMP:           13w 0d        Date:  03/08/16                 EDD:   12/13/16
Best:          13w 0d     Det. By:  LMP  (03/08/16)          EDD:   12/13/16
1ST TRIMESTER GENETIC SONOGRAM SCREENING:
Nuc Trans:       1.5  mm
ANATOMY:
Choroid Plexus:        Within normal limits   Bladder:                Seen
for gestational age
Stomach:               Seen                   Upper Extremities:      Visualized
Abdominal Wall:        Within normal limits   Lower Extremities:      Visualized
CERVIX UTERUS ADNEXA:
Cervix
Length:           3.79  cm.

[Series 1: us mfm ob complete +14 wks · 0.15mm/px · 13 of 43 slices shown]
[im 2/43]
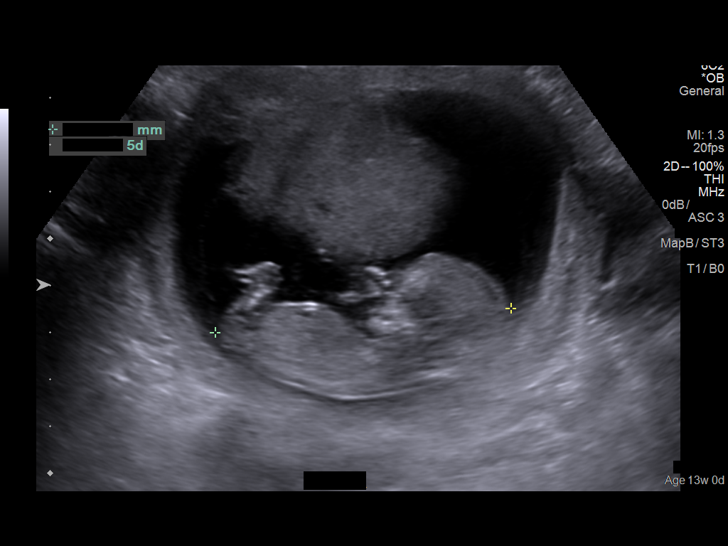
[im 5/43]
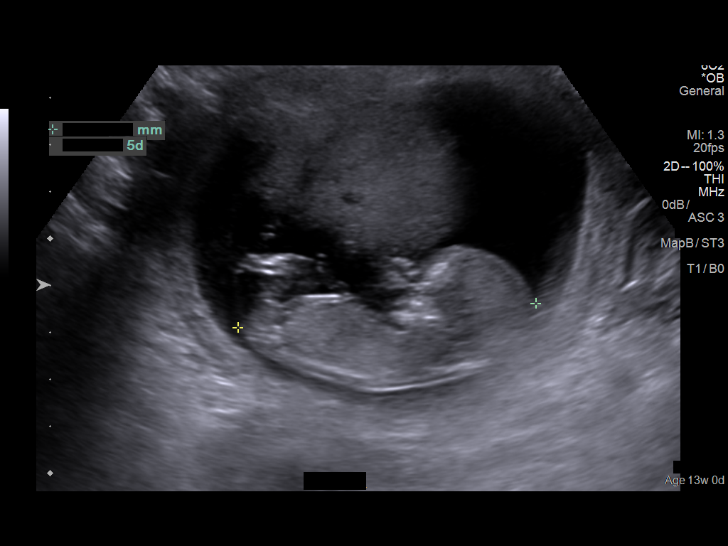
[im 8/43]
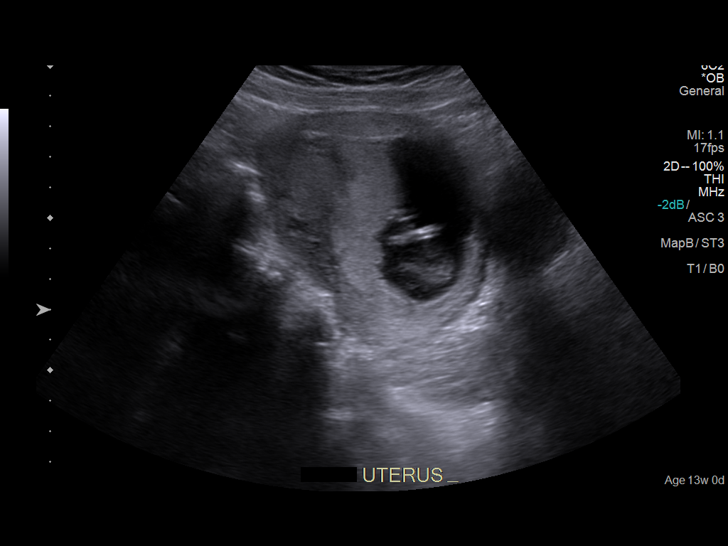
[im 11/43]
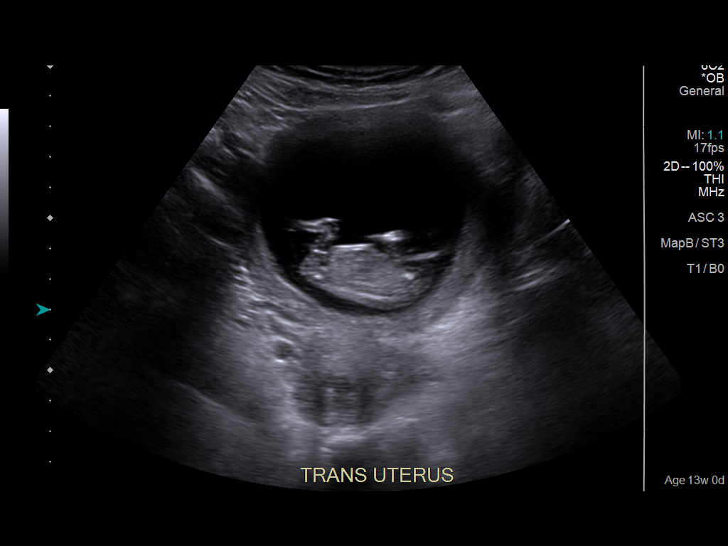
[im 15/43]
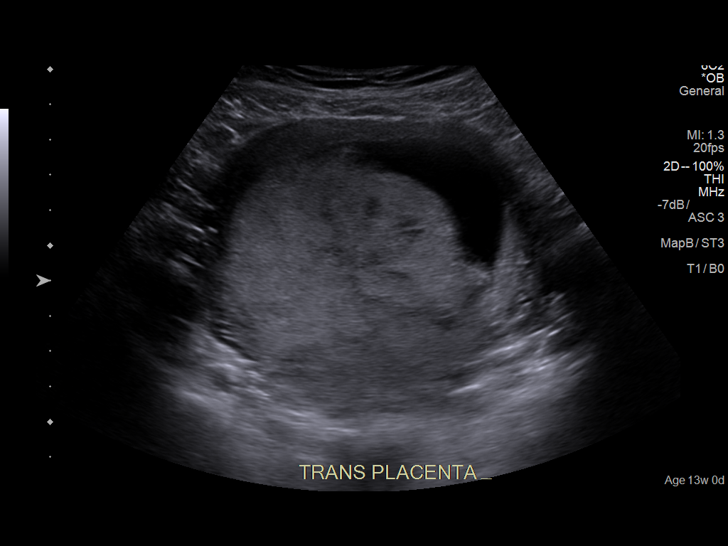
[im 18/43]
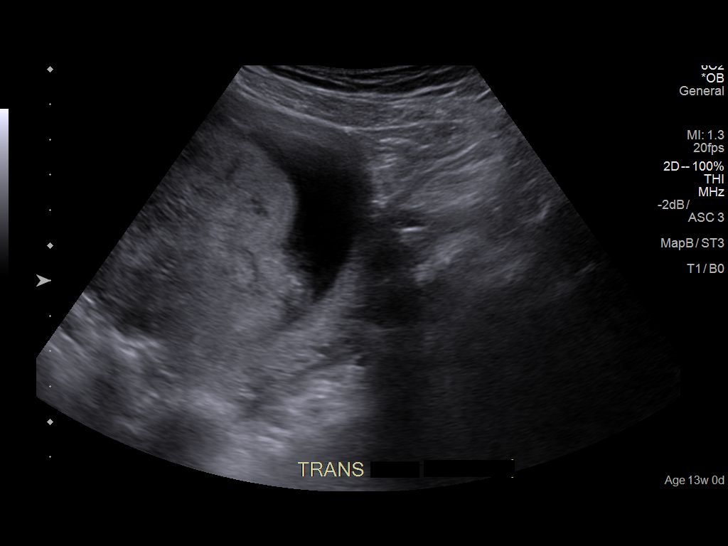
[im 22/43]
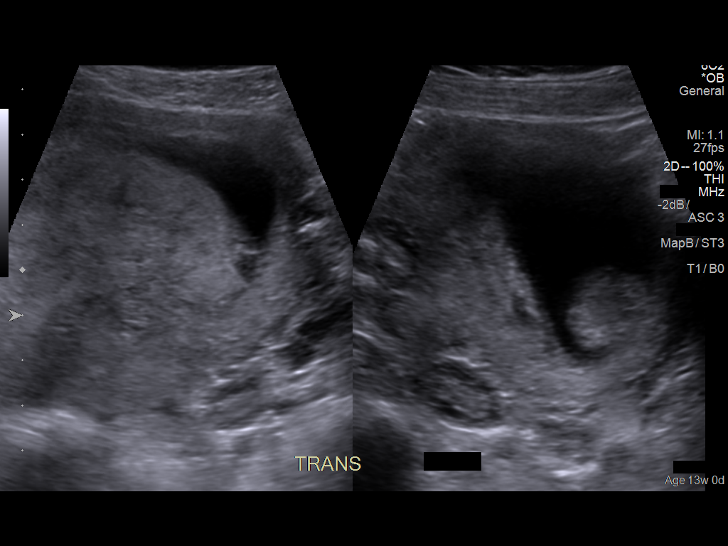
[im 25/43]
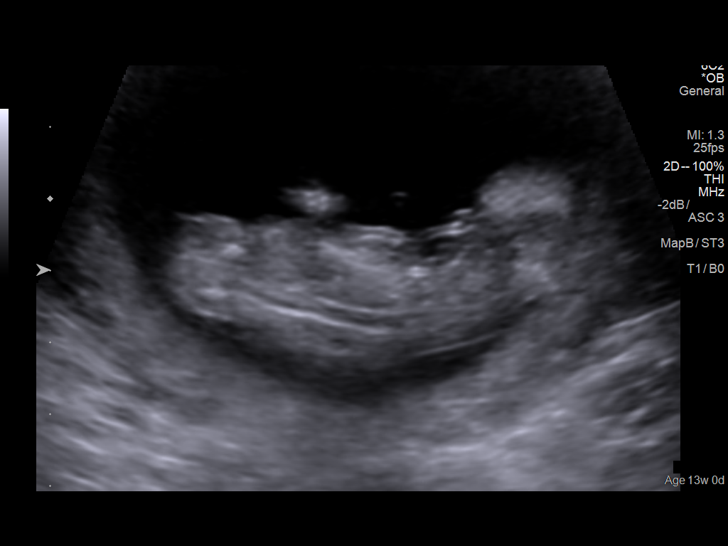
[im 29/43]
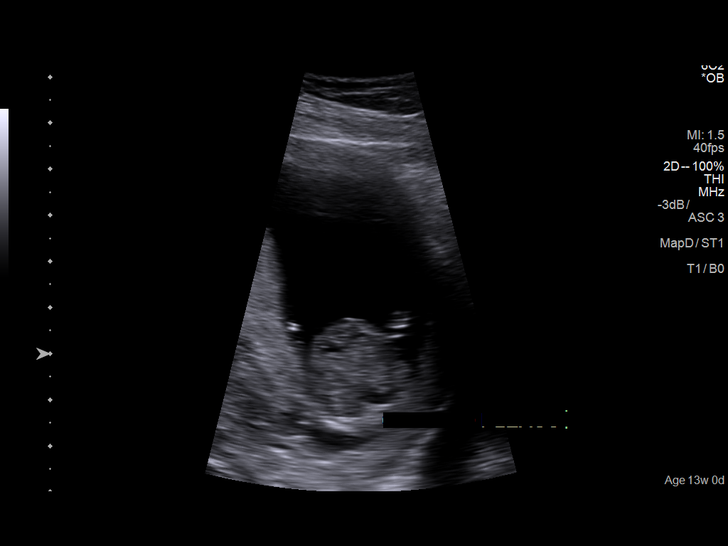
[im 32/43]
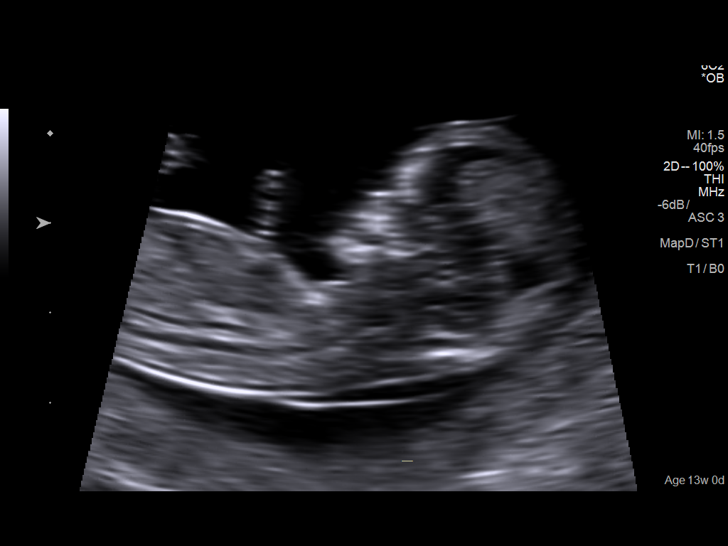
[im 35/43]
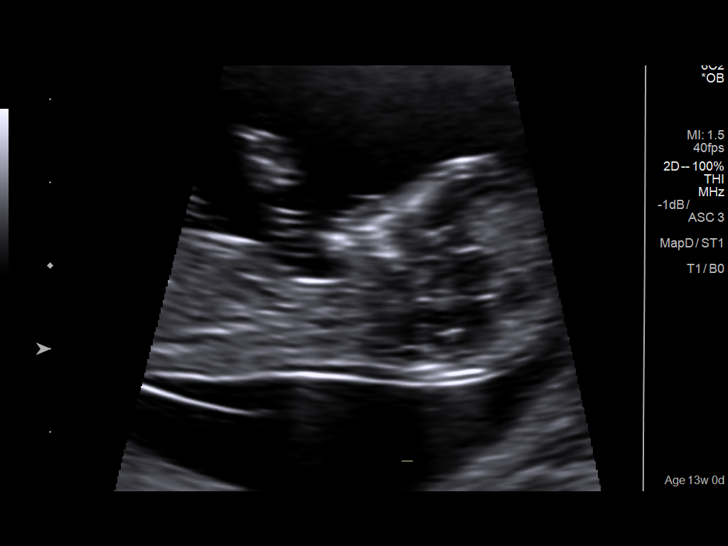
[im 38/43]
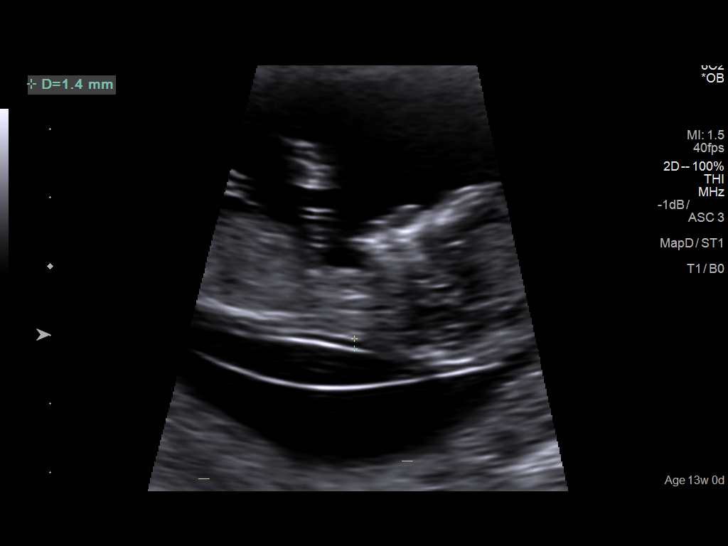
[im 41/43]
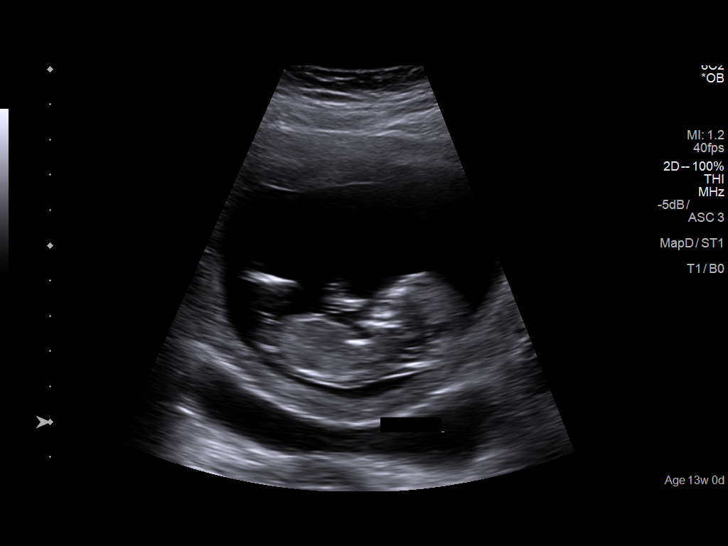

[13 of 28 positions shown; findings below may reference images not displayed]

IMPRESSION: Dear Dr. Cordoba,

Thank you for referring your patient to [facility] for the
first trimester nuchal translucency screening program.

A singleton gestation is noted.

The fetal biometry correlates with established dating.

The nuchal translucency measured 1.5  mm.

The ovaries were WNL.

The patient had blood drawn for first trimester screening.

A composite risk incorporating her age, nuchal translucency
measurement and blood results should be returned to your
office shortly.

First trimester screening (Beta HCG/ESSEL/AFP/nuchal
translucency) to assess aneuploidy risk has been studied
prospectively and appears to be a reliable means for
screening for Down syndrome, trisomy 18, and trisomy 13.

The only way to exclude a diagnosis of aneuploidy is by
invasive testing such as chorionic villus sampling or
amniocentesis. Genetic counseling was conducted today.
Thank you for allowing us to participate in your patient's care.

assistance.

## 2018-02-01 ENCOUNTER — Other Ambulatory Visit: Payer: Self-pay

## 2018-02-01 ENCOUNTER — Ambulatory Visit
Admission: EM | Admit: 2018-02-01 | Discharge: 2018-02-01 | Disposition: A | Discharge status: Home / Self Care | Payer: Medicaid Other | Attending: Family Medicine | Admitting: Family Medicine

## 2018-02-01 DIAGNOSIS — J029 Acute pharyngitis, unspecified: Secondary | ICD-10-CM | POA: Diagnosis present

## 2018-02-01 DIAGNOSIS — Z793 Long term (current) use of hormonal contraceptives: Secondary | ICD-10-CM | POA: Diagnosis not present

## 2018-02-01 DIAGNOSIS — Z87891 Personal history of nicotine dependence: Secondary | ICD-10-CM | POA: Insufficient documentation

## 2018-02-01 DIAGNOSIS — J020 Streptococcal pharyngitis: Secondary | ICD-10-CM | POA: Diagnosis not present

## 2018-02-01 LAB — RAPID STREP SCREEN (MED CTR MEBANE ONLY): Streptococcus, Group A Screen (Direct): POSITIVE — AB

## 2018-02-01 MED ORDER — PENICILLIN G BENZATHINE 1200000 UNIT/2ML IM SUSP
1.2000 10*6.[IU] | Freq: Once | INTRAMUSCULAR | Status: AC
  Administered 2018-02-01: 1.2 10*6.[IU] via INTRAMUSCULAR

## 2018-02-01 NOTE — ED Triage Notes (Signed)
Patient complains of sore throat and bilateral ear pain since Monday. Patient states that she has also had a fever, painful swallowing and talking.

## 2018-02-01 NOTE — ED Provider Notes (Signed)
[facility]    CSN: [id number] Arrival date & time: 02/01/18  1209  History   Chief Complaint Chief Complaint  Patient presents with  . Sore Throat   HPI  32 year old female presents with sore throat, fever, ear pain.  Started on Monday.  She states that her sore throat is very severe.  She has difficulty talking as well as swallowing.  She has taken DayQuil and NyQuil with mild improvement in her pain.  She states that her fever has been as high as 103.1.  Associated bilateral ear pain.  No reported sick contacts.  Sore throat is worse when she swallows and talk.  No other associated symptoms.  No other complaints.  Past Medical History:  Diagnosis Date  . Seizures (HCC)    as a child   Patient Active Problem List   Diagnosis Date Noted  . Encounter for induction of labor 12/04/2016  . Supervision of high risk pregnancy in third trimester 12/03/2016  . Thrombocytopenia (HCC) 12/03/2016  . Thrombocytopenia affecting pregnancy, antepartum (HCC) 12/03/2016  . First trimester screening 06/07/2016    Past Surgical History:  Procedure Laterality Date  . NO PAST SURGERIES      OB History    Gravida  3   Para  1   Term  1   Preterm      AB  1   Living  2     SAB  1   TAB      Ectopic      Multiple  0   Live Births  1            Home Medications    Prior to Admission medications   Medication Sig Start Date End Date Taking? Authorizing Provider  norethindrone (ORTHO MICRONOR) 0.35 MG tablet Take 1 tablet (0.35 mg total) by mouth daily. 12/06/16  Yes Schermerhorn, Ihor Austinhomas J, MD    Family History History reviewed. No pertinent family history.  Social History Social History   Tobacco Use  . Smoking status: Former Smoker    Types: Cigarettes  . Smokeless tobacco: Never Used  Substance Use Topics  . Alcohol use: No  . Drug use: No     Allergies   Patient has no known allergies.   Review of Systems Review of Systems    Constitutional: Positive for fever.  HENT: Positive for ear pain and sore throat.    Physical Exam Triage Vital Signs ED Triage Vitals  Enc Vitals Group     BP 02/01/18 1219 (!) 130/57     Pulse Rate 02/01/18 1219 (!) 104     Resp 02/01/18 1219 18     Temp 02/01/18 1219 99.7 F (37.6 C)     Temp Source 02/01/18 1219 Oral     SpO2 02/01/18 1219 99 %     Weight 02/01/18 1216 170 lb (77.1 kg)     Height 02/01/18 1216 5\' 3"  (1.6 m)     Head Circumference --      Peak Flow --      Pain Score 02/01/18 1216 8     Pain Loc --      Pain Edu? --      Excl. in GC? --    Updated Vital Signs BP (!) 130/57 (BP Location: Left Arm)   Pulse (!) 104   Temp 99.7 F (37.6 C) (Oral)   Resp 18   Ht 5\' 3"  (1.6 m)   Wt 170 lb (77.1 kg)  LMP 01/20/2018   SpO2 99%   Breastfeeding? No   BMI 30.11 kg/m   Visual Acuity Right Eye Distance:   Left Eye Distance:   Bilateral Distance:    Right Eye Near:   Left Eye Near:    Bilateral Near:     Physical Exam  Constitutional: She is oriented to person, place, and time. She appears well-developed.  Appears fatigued and in pain.  HENT:  Head: Normocephalic and atraumatic.  Normal TM's bilaterally. Oropharynx with severe erythema.  No exudate noted.  Uvula is midline & edematous.  No evidence of peritonsillar abscess.  Cardiovascular: Regular rhythm.  Tachycardia.  Pulmonary/Chest: Effort normal and breath sounds normal. She has no wheezes. She has no rales.  Neurological: She is alert and oriented to person, place, and time.  Psychiatric: She has a normal mood and affect. Her behavior is normal.  Nursing note and vitals reviewed.  UC Treatments / Results  Labs (all labs ordered are listed, but only abnormal results are displayed) Labs Reviewed  RAPID STREP SCREEN ([facility] ONLY) - Abnormal; Notable for the following components:      Result Value   Streptococcus, Group A Screen (Direct) POSITIVE (*)    All other components within  normal limits    EKG None  Radiology No results found.  Procedures Procedures (including critical care time)  Medications Ordered in UC Medications  penicillin g benzathine (BICILLIN LA) 1200000 UNIT/2ML injection 1.2 Million Units (1.2 Million Units Intramuscular Given 02/01/18 1247)    Initial Impression / Assessment and Plan / UC Course  I have reviewed the triage vital signs and the nursing notes.  Pertinent labs & imaging results that were available during my care of the patient were reviewed by me and considered in my medical decision making (see chart for details).    32 year old female presents with strep throat.  After discussion about treatment options, patient elected for IM injection.  Penicillin G given today.  Final Clinical Impressions(s) / UC Diagnoses   Final diagnoses:  Strep pharyngitis   Discharge Instructions   None    ED Prescriptions    None     Controlled Substance Prescriptions [place] Controlled Substance Registry consulted? Not Applicable   Tommie Sams, DO 02/01/18 1253

## 2018-07-13 ENCOUNTER — Ambulatory Visit
Admission: EM | Admit: 2018-07-13 | Discharge: 2018-07-13 | Disposition: A | Discharge status: Home / Self Care | Payer: Medicaid Other | Attending: Family Medicine | Admitting: Family Medicine

## 2018-07-13 ENCOUNTER — Other Ambulatory Visit: Payer: Self-pay

## 2018-07-13 ENCOUNTER — Encounter: Payer: Self-pay | Admitting: Emergency Medicine

## 2018-07-13 DIAGNOSIS — J988 Other specified respiratory disorders: Secondary | ICD-10-CM | POA: Diagnosis not present

## 2018-07-13 MED ORDER — BENZONATATE 100 MG PO CAPS: 100.0000 mg | ORAL_CAPSULE | Freq: Three times a day (TID) | ORAL | 0 refills | Status: DC | PRN

## 2018-07-13 MED ORDER — HYDROCODONE-HOMATROPINE 5-1.5 MG/5ML PO SYRP: 5.0000 mL | ORAL_SOLUTION | Freq: Four times a day (QID) | ORAL | 0 refills | Status: DC | PRN

## 2018-07-13 MED ORDER — AZITHROMYCIN 250 MG PO TABS: ORAL_TABLET | ORAL | 0 refills | Status: DC

## 2018-07-13 NOTE — ED Triage Notes (Signed)
Patient in today c/o productive cough (greenish brown) x 4 days. Patient felt feverish yesterday, but didn't take her temperature.

## 2018-07-13 NOTE — ED Provider Notes (Signed)
[facility]    CSN: [id number] Arrival date & time: 07/13/18  1043  History   Chief Complaint Chief Complaint  Patient presents with  . Cough   HPI  32 year old female presents with respiratory symptoms.  Patient reports that she has been sick for the past 4 days.  Patient reports cough, sore throat, associated chest tightness.  She has had associated runny nose as well.  No fever.  She has used DayQuil, NyQuil, Mucinex without resolution.  Her predominant complaint is cough which is productive.  Feels poorly.  No known exacerbating factors.  No other associated symptoms.  No other complaints.  PMH, Surgical Hx, Family Hx, Social History reviewed and updated as below.  Past Medical History:  Diagnosis Date  . Seizures (HCC)    as a child   Patient Active Problem List   Diagnosis Date Noted  . Encounter for induction of labor 12/04/2016  . Supervision of high risk pregnancy in third trimester 12/03/2016  . Thrombocytopenia (HCC) 12/03/2016  . Thrombocytopenia affecting pregnancy, antepartum (HCC) 12/03/2016  . First trimester screening 06/07/2016   Past Surgical History:  Procedure Laterality Date  . NO PAST SURGERIES     OB History    Gravida  3   Para  1   Term  1   Preterm      AB  1   Living  2     SAB  1   TAB      Ectopic      Multiple  0   Live Births  1          Home Medications    Prior to Admission medications   Medication Sig Start Date End Date Taking? Authorizing Provider  norethindrone (ORTHO MICRONOR) 0.35 MG tablet Take 1 tablet (0.35 mg total) by mouth daily. 12/06/16  Yes Schermerhorn, Ihor Austinhomas J, MD  azithromycin (ZITHROMAX) 250 MG tablet 2 tablets on day 1, then 1 tablet daily on days 2-5. 07/13/18   Tommie Samsook, Rudean Icenhour G, DO  benzonatate (TESSALON) 100 MG capsule Take 1 capsule (100 mg total) by mouth 3 (three) times daily as needed. 07/13/18   Tommie Samsook, Corneisha Alvi G, DO  HYDROcodone-homatropine (HYCODAN) 5-1.5 MG/5ML syrup Take 5  mLs by mouth every 6 (six) hours as needed. 07/13/18   Tommie Samsook, Roshini Fulwider G, DO   Family History Family History  Problem Relation Age of Onset  . Healthy Mother   . Healthy Father     Social History Social History   Tobacco Use  . Smoking status: Former Smoker    Types: Cigarettes    Last attempt to quit: 07/13/2010    Years since quitting: 8.0  . Smokeless tobacco: Never Used  Substance Use Topics  . Alcohol use: No  . Drug use: No   Allergies   Eggs or egg-derived products   Review of Systems Review of Systems  Constitutional: Negative for fever.  HENT: Positive for rhinorrhea and sore throat.   Respiratory: Positive for cough and chest tightness.    Physical Exam Triage Vital Signs ED Triage Vitals  Enc Vitals Group     BP 07/13/18 1053 130/87     Pulse Rate 07/13/18 1053 91     Resp 07/13/18 1053 16     Temp 07/13/18 1053 98.3 F (36.8 C)     Temp Source 07/13/18 1053 Oral     SpO2 07/13/18 1053 100 %     Weight 07/13/18 1053 175 lb (79.4 kg)  Height 07/13/18 1053 5\' 3"  (1.6 m)     Head Circumference --      Peak Flow --      Pain Score 07/13/18 1052 2     Pain Loc --      Pain Edu? --      Excl. in GC? --    Updated Vital Signs BP 130/87 (BP Location: Left Arm)   Pulse 91   Temp 98.3 F (36.8 C) (Oral)   Resp 16   Ht 5\' 3"  (1.6 m)   Wt 79.4 kg   LMP 07/06/2018 (Approximate)   SpO2 100%   BMI 31.00 kg/m   Visual Acuity Right Eye Distance:   Left Eye Distance:   Bilateral Distance:    Right Eye Near:   Left Eye Near:    Bilateral Near:     Physical Exam Vitals signs and nursing note reviewed.  Constitutional:      General: She is not in acute distress.    Appearance: Normal appearance.  HENT:     Head: Normocephalic and atraumatic.     Right Ear: Tympanic membrane normal.     Left Ear: Tympanic membrane normal.     Nose: Nose normal.     Mouth/Throat:     Comments: Oropharynx with mild erythema. Eyes:     General:        Right eye:  No discharge.        Left eye: No discharge.     Conjunctiva/sclera: Conjunctivae normal.  Cardiovascular:     Rate and Rhythm: Normal rate and regular rhythm.  Pulmonary:     Effort: Pulmonary effort is normal.     Breath sounds: Normal breath sounds.  Neurological:     Mental Status: She is alert.  Psychiatric:        Mood and Affect: Mood normal.        Thought Content: Thought content normal.    UC Treatments / Results  Labs (all labs ordered are listed, but only abnormal results are displayed) Labs Reviewed - No data to display  EKG None  Radiology No results found.  Procedures Procedures (including critical care time)  Medications Ordered in UC Medications - No data to display  Initial Impression / Assessment and Plan / UC Course  I have reviewed the triage vital signs and the nursing notes.  Pertinent labs & imaging results that were available during my care of the patient were reviewed by me and considered in my medical decision making (see chart for details).    32 year old female presents with respiratory infection.  Likely viral.  Treating with Tessalon Perles and Hycodan as needed.  Rest, fluids.  Wait-and-see prescription given for azithromycin if she fails to improve or worsens.  Final Clinical Impressions(s) / UC Diagnoses   Final diagnoses:  Respiratory infection     Discharge Instructions     This is likely viral.  Use the cough medications.  Rest, fluids.  You can fill the antibiotic if you fail to improve or worsen.   ED Prescriptions    Medication Sig Dispense Auth. Provider   benzonatate (TESSALON) 100 MG capsule Take 1 capsule (100 mg total) by mouth 3 (three) times daily as needed. 30 capsule Nivea Wojdyla G, DO   HYDROcodone-homatropine (HYCODAN) 5-1.5 MG/5ML syrup Take 5 mLs by mouth every 6 (six) hours as needed. 120 mL Andon Villard G, DO   azithromycin (ZITHROMAX) 250 MG tablet 2 tablets on day 1, then 1 tablet daily  on days 2-5. 6  tablet Tommie Samsook, Rayson Rando G, DO     Controlled Substance Prescriptions [place] Controlled Substance Registry consulted? Not Applicable   Tommie SamsCook, Sinclair Arrazola G, DO 07/13/18 1158

## 2018-07-13 NOTE — Discharge Instructions (Signed)
This is likely viral.  Use the cough medications.  Rest, fluids.  You can fill the antibiotic if you fail to improve or worsen.

## 2018-07-26 DIAGNOSIS — E063 Autoimmune thyroiditis: Secondary | ICD-10-CM

## 2018-07-26 HISTORY — DX: Autoimmune thyroiditis: E06.3

## 2018-08-11 ENCOUNTER — Other Ambulatory Visit: Payer: Self-pay

## 2018-08-11 ENCOUNTER — Ambulatory Visit
Admission: EM | Admit: 2018-08-11 | Discharge: 2018-08-11 | Disposition: A | Discharge status: Home / Self Care | Payer: Medicaid Other | Attending: Family Medicine | Admitting: Family Medicine

## 2018-08-11 ENCOUNTER — Ambulatory Visit: Payer: Medicaid Other

## 2018-08-11 ENCOUNTER — Encounter: Payer: Self-pay | Admitting: Emergency Medicine

## 2018-08-11 DIAGNOSIS — R109 Unspecified abdominal pain: Secondary | ICD-10-CM | POA: Insufficient documentation

## 2018-08-11 DIAGNOSIS — N12 Tubulo-interstitial nephritis, not specified as acute or chronic: Secondary | ICD-10-CM | POA: Insufficient documentation

## 2018-08-11 LAB — BASIC METABOLIC PANEL
ANION GAP: 9 (ref 5–15)
BUN: 14 mg/dL (ref 6–20)
CALCIUM: 9.2 mg/dL (ref 8.9–10.3)
CO2: 23 mmol/L (ref 22–32)
Chloride: 105 mmol/L (ref 98–111)
Creatinine, Ser: 0.55 mg/dL (ref 0.44–1.00)
GLUCOSE: 84 mg/dL (ref 70–99)
Potassium: 3.9 mmol/L (ref 3.5–5.1)
Sodium: 137 mmol/L (ref 135–145)

## 2018-08-11 LAB — CBC WITH DIFFERENTIAL/PLATELET
Abs Immature Granulocytes: 0.01 10*3/uL (ref 0.00–0.07)
BASOS ABS: 0 10*3/uL (ref 0.0–0.1)
BASOS PCT: 1 %
EOS ABS: 0.2 10*3/uL (ref 0.0–0.5)
Eosinophils Relative: 2 %
HCT: 38.4 % (ref 36.0–46.0)
Hemoglobin: 13.1 g/dL (ref 12.0–15.0)
IMMATURE GRANULOCYTES: 0 %
Lymphocytes Relative: 26 %
Lymphs Abs: 2.1 10*3/uL (ref 0.7–4.0)
MCH: 30.4 pg (ref 26.0–34.0)
MCHC: 34.1 g/dL (ref 30.0–36.0)
MCV: 89.1 fL (ref 80.0–100.0)
Monocytes Absolute: 0.7 10*3/uL (ref 0.1–1.0)
Monocytes Relative: 9 %
NEUTROS PCT: 62 %
NRBC: 0 % (ref 0.0–0.2)
Neutro Abs: 5 10*3/uL (ref 1.7–7.7)
PLATELETS: 180 10*3/uL (ref 150–400)
RBC: 4.31 MIL/uL (ref 3.87–5.11)
RDW: 12 % (ref 11.5–15.5)
WBC: 8 10*3/uL (ref 4.0–10.5)

## 2018-08-11 LAB — URINALYSIS, COMPLETE (UACMP) WITH MICROSCOPIC
Bilirubin Urine: NEGATIVE
Glucose, UA: NEGATIVE mg/dL
KETONES UR: NEGATIVE mg/dL
Nitrite: POSITIVE — AB
PROTEIN: 30 mg/dL — AB
RBC / HPF: >50 RBC/hpf (ref 0–5)
Specific Gravity, Urine: >1.03 — ABNORMAL HIGH (ref 1.005–1.030)
pH: 6.5 (ref 5.0–8.0)

## 2018-08-11 MED ORDER — CEPHALEXIN 500 MG PO CAPS: 500.0000 mg | ORAL_CAPSULE | Freq: Two times a day (BID) | ORAL | 0 refills | Status: AC

## 2018-08-11 MED ORDER — CEFTRIAXONE SODIUM 1 G IJ SOLR
1.0000 g | Freq: Once | INTRAMUSCULAR | Status: AC
  Administered 2018-08-11: 1 g via INTRAMUSCULAR

## 2018-08-11 NOTE — Discharge Instructions (Addendum)
Take medication as prescribed. Rest. Drink plenty of fluids.   Follow up with your primary care physician this week as needed.  Return to urgent care as needed.  Proceed directly to the emergency room for any inability to tolerate food or fluids, fever, increased pain or worsening concerns.

## 2018-08-11 NOTE — ED Notes (Signed)
Pt in treatment room.  In NAD.  Needs address.  Pt/Fam updated on POC.   

## 2018-08-11 NOTE — ED Provider Notes (Signed)
[facility] ____________________________________________  Time seen: Approximately 5:59 PM  I have reviewed the triage vital signs and the nursing notes.   HISTORY  Chief Complaint Dysuria (appt) and Back Pain   HPI Maureen Hoffman is a 33 y.o. female presenting for evaluation of 3 to 4 days of some aching low back discomfort bilaterally, but reports since yesterday she has had left-sided low back pain that intermittently radiates down to her left pelvis with some burning with urination.  Denies any other abdominal pain.  Denies injury.  Has had some nausea and some chills but denies vomiting or fevers.  No diarrhea.  Reports she is a long history of UTIs as well as history of kidney stones, but none recently.  Denies known triggers.  Overall continues to eat and drink well.  Denies chest pain, shortness of breath, known fevers, rash or other complaints.  Reports otherwise doing well denies other complaints.     Past Medical History:  Diagnosis Date  . Seizures (HCC)    as a child    Patient Active Problem List   Diagnosis Date Noted  . Encounter for induction of labor 12/04/2016  . Supervision of high risk pregnancy in third trimester 12/03/2016  . Thrombocytopenia (HCC) 12/03/2016  . Thrombocytopenia affecting pregnancy, antepartum (HCC) 12/03/2016  . First trimester screening 06/07/2016    Past Surgical History:  Procedure Laterality Date  . NO PAST SURGERIES       No current facility-administered medications for this encounter.   Current Outpatient Medications:  .  cephALEXin (KEFLEX) 500 MG capsule, Take 1 capsule (500 mg total) by mouth 2 (two) times daily for 10 days., Disp: 20 capsule, Rfl: 0 .  norethindrone (ORTHO MICRONOR) 0.35 MG tablet, Take 1 tablet (0.35 mg total) by mouth daily., Disp: 1 Package, Rfl: 11  Allergies Eggs or egg-derived products  Family History  Problem Relation Age of Onset  . Healthy Mother   . Healthy Father      Social History Social History   Tobacco Use  . Smoking status: Former Smoker    Types: Cigarettes    Last attempt to quit: 07/13/2010    Years since quitting: 8.0  . Smokeless tobacco: Never Used  Substance Use Topics  . Alcohol use: No  . Drug use: No    Review of Systems Constitutional: No fever Cardiovascular: Denies chest pain. Respiratory: Denies shortness of breath. Gastrointestinal: As above.  Positive nausea.  No vomiting.  No diarrhea. Genitourinary: Negative for dysuria. Musculoskeletal: Left flank pain. Skin: Negative for rash.   ____________________________________________   PHYSICAL EXAM:  VITAL SIGNS: ED Triage Vitals  Enc Vitals Group     BP 08/11/18 1639 (!) 128/96     Pulse Rate 08/11/18 1639 79     Resp 08/11/18 1639 16     Temp 08/11/18 1639 98.2 F (36.8 C)     Temp Source 08/11/18 1639 Oral     SpO2 08/11/18 1639 100 %     Weight 08/11/18 1636 175 lb (79.4 kg)     Height 08/11/18 1636 5\' 3"  (1.6 m)     Head Circumference --      Peak Flow --      Pain Score 08/11/18 1635 4     Pain Loc --      Pain Edu? --      Excl. in GC? --     Constitutional: Alert and oriented. Well appearing and in no acute distress. ENT  Head: Normocephalic and atraumatic. Cardiovascular: Normal rate, regular rhythm. Grossly normal heart sounds.  Good peripheral circulation. Respiratory: Normal respiratory effort without tachypnea nor retractions. Breath sounds are clear and equal bilaterally. No wheezes, rales, rhonchi. Gastrointestinal: Normal bowel sounds.  Minimal left suprapubic tenderness to palpation, mild to moderate left CVA tenderness.  No right CVA tenderness.  Abdomen otherwise soft and nontender. Musculoskeletal:  No midline cervical, thoracic or lumbar tenderness to palpation.  Neurologic:  Normal speech and language.Speech is normal. No gait instability.  Skin:  Skin is warm, dry  Psychiatric: Mood and affect are normal. Speech and behavior  are normal. Patient exhibits appropriate insight and judgment   ___________________________________________   LABS (all labs ordered are listed, but only abnormal results are displayed)  Labs Reviewed  URINALYSIS, COMPLETE (UACMP) WITH MICROSCOPIC - Abnormal; Notable for the following components:      Result Value   APPearance CLOUDY (*)    Specific Gravity, Urine >1.030 (*)    Hgb urine dipstick MODERATE (*)    Protein, ur 30 (*)    Nitrite POSITIVE (*)    Leukocytes, UA SMALL (*)    Bacteria, UA MANY (*)    All other components within normal limits  URINE CULTURE  CBC WITH DIFFERENTIAL/PLATELET  BASIC METABOLIC PANEL    RADIOLOGY  Ct Renal Stone Study  Result Date: 08/11/2018 CLINICAL DATA:  Left flank pain for 4 days EXAM: CT ABDOMEN AND PELVIS WITHOUT CONTRAST TECHNIQUE: Multidetector CT imaging of the abdomen and pelvis was performed following the standard protocol without IV contrast. COMPARISON:  August 13, 2010 FINDINGS: Lower chest: No acute abnormality. Hepatobiliary: No focal liver abnormality is seen. No gallstones, gallbladder wall thickening, or biliary dilatation. Pancreas: Unremarkable. No pancreatic ductal dilatation or surrounding inflammatory changes. Spleen: Normal in size without focal abnormality. Adrenals/Urinary Tract: Adrenal glands are unremarkable. Kidneys are normal, without renal calculi, focal lesion, or hydronephrosis. Bladder is unremarkable. Stomach/Bowel: Stomach is within normal limits. Appendix appears normal. No evidence of bowel wall thickening, distention, or inflammatory changes. Vascular/Lymphatic: No significant vascular findings are present. No enlarged abdominal or pelvic lymph nodes. Reproductive: Uterus and bilateral adnexa are unremarkable. Other: Umbilical herniation of mesenteric fat is identified. Musculoskeletal: No acute abnormality identified. IMPRESSION: No acute abnormality identified in the abdomen pelvis. No nephrolithiasis or  hydronephrosis bilaterally. No bowel obstruction or diverticulitis. Electronically Signed   By: Sherian Rein M.D.   On: 08/11/2018 17:59   ____________________________________________   PROCEDURES Procedures    INITIAL IMPRESSION / ASSESSMENT AND PLAN / ED COURSE  Pertinent labs & imaging results that were available during my care of the patient were reviewed by me and considered in my medical decision making (see chart for details).  Overall well-appearing patient.  No acute distress.  Urinalysis reviewed, UTI positive.  However with left flank pain, concern for pyelonephritis versus infected stone.  Patient with history of UTIs as well as nephrolithiasis.  CBC and BMP evaluated and reviewed, unremarkable.  CT renal obtained, as above per radiologist, no acute abnormality, no left nephrolithiasis or hydronephrosis.  Suspect UTI with pyelonephritis.  1 g IM Rocephin given once in urgent care.  Will treat with oral Keflex and culture urine.  Encouraged rest, fluids, supportive care.  Discussed very strict follow-up and return parameters.  Discussed proceed directly to emergency room for any fevers, inability to tolerate food or fluids, increased pain or worsening concerns. Discussed indication, risks and benefits of medications with patient.  Discussed follow up with Primary care  physician this week. Discussed follow up and return parameters including no resolution or any worsening concerns. Patient verbalized understanding and agreed to plan.   ____________________________________________   FINAL CLINICAL IMPRESSION(S) / ED DIAGNOSES  Final diagnoses:  Pyelonephritis  Left flank pain     ED Discharge Orders         Ordered    cephALEXin (KEFLEX) 500 MG capsule  2 times daily     08/11/18 1809           Note: This dictation was prepared with Dragon dictation along with smaller phrase technology. Any transcriptional errors that result from this process are unintentional.           Renford DillsMiller, Lenda Baratta, NP 08/11/18 [phone]

## 2018-08-11 NOTE — ED Triage Notes (Signed)
Pt c/o dysuria, urinary urgency, and lower back pain that radiates to her left pelvic area. She has had kidney stones before and reports that it feel similar. Started yesterday.

## 2018-08-14 ENCOUNTER — Telehealth (HOSPITAL_COMMUNITY): Payer: Self-pay | Admitting: Emergency Medicine

## 2018-08-14 LAB — URINE CULTURE

## 2018-08-14 NOTE — Telephone Encounter (Signed)
Urine culture was positive for ESCHERICHIA COLI and was given  KEFLEX at urgent care visit. Attempted to reach patient. No answer at this time.

## 2019-11-07 ENCOUNTER — Encounter: Payer: Self-pay | Admitting: Emergency Medicine

## 2019-11-07 ENCOUNTER — Ambulatory Visit
Admission: EM | Admit: 2019-11-07 | Discharge: 2019-11-07 | Disposition: A | Discharge status: Home / Self Care | Payer: Medicaid Other | Attending: Family Medicine | Admitting: Family Medicine

## 2019-11-07 ENCOUNTER — Other Ambulatory Visit: Payer: Self-pay

## 2019-11-07 DIAGNOSIS — N3000 Acute cystitis without hematuria: Secondary | ICD-10-CM | POA: Insufficient documentation

## 2019-11-07 LAB — URINALYSIS, COMPLETE (UACMP) WITH MICROSCOPIC
Bilirubin Urine: NEGATIVE
Glucose, UA: NEGATIVE mg/dL
Hgb urine dipstick: NEGATIVE
Ketones, ur: NEGATIVE mg/dL
Nitrite: POSITIVE — AB
Protein, ur: NEGATIVE mg/dL
RBC / HPF: NONE SEEN RBC/hpf (ref 0–5)
Specific Gravity, Urine: 1.02 (ref 1.005–1.030)
pH: 7.5 (ref 5.0–8.0)

## 2019-11-07 MED ORDER — CEPHALEXIN 500 MG PO CAPS: 500.0000 mg | ORAL_CAPSULE | Freq: Two times a day (BID) | ORAL | 0 refills | Status: DC

## 2019-11-07 NOTE — ED Provider Notes (Signed)
[facility]    CSN: [id number] Arrival date & time: 11/07/19  1348      History   Chief Complaint Chief Complaint  Patient presents with  . Dysuria  . Urinary Urgency   HPI  34 year old female presents with the above complaints.  Patient reports 3-day history of urinary urgency and dysuria.  No documented fever.  No reports of abdominal pain.  No relieving factors.  No known inciting factor.  Pain currently 2/10 in severity.  No other associated symptoms.  No other complaints.  Past Medical History:  Diagnosis Date  . Seizures (HCC)    as a child    Patient Active Problem List   Diagnosis Date Noted  . Encounter for induction of labor 12/04/2016  . Supervision of high risk pregnancy in third trimester 12/03/2016  . Thrombocytopenia (HCC) 12/03/2016  . Thrombocytopenia affecting pregnancy, antepartum (HCC) 12/03/2016  . First trimester screening 06/07/2016    Past Surgical History:  Procedure Laterality Date  . NO PAST SURGERIES      OB History    Gravida  3   Para  1   Term  1   Preterm      AB  1   Living  2     SAB  1   TAB      Ectopic      Multiple  0   Live Births  1            Home Medications    Prior to Admission medications   Medication Sig Start Date End Date Taking? Authorizing Provider  cephALEXin (KEFLEX) 500 MG capsule Take 1 capsule (500 mg total) by mouth 2 (two) times daily. 11/07/19   Tommie Sams, DO  norethindrone (ORTHO MICRONOR) 0.35 MG tablet Take 1 tablet (0.35 mg total) by mouth daily. 12/06/16   Schermerhorn, Ihor Austin, MD    Family History Family History  Problem Relation Age of Onset  . Healthy Mother   . Healthy Father     Social History Social History   Tobacco Use  . Smoking status: Former Smoker    Types: Cigarettes    Quit date: 07/13/2010    Years since quitting: 9.3  . Smokeless tobacco: Never Used  Substance Use Topics  . Alcohol use: No  . Drug use: No     Allergies     Eggs or egg-derived products   Review of Systems Review of Systems  Constitutional: Negative for fever.  Genitourinary: Positive for dysuria and urgency.   Physical Exam Triage Vital Signs ED Triage Vitals  Enc Vitals Group     BP 11/07/19 1405 123/77     Pulse Rate 11/07/19 1405 83     Resp 11/07/19 1405 18     Temp 11/07/19 1405 98.6 F (37 C)     Temp Source 11/07/19 1405 Oral     SpO2 11/07/19 1405 99 %     Weight 11/07/19 1404 165 lb (74.8 kg)     Height 11/07/19 1404 5\' 3"  (1.6 m)     Head Circumference --      Peak Flow --      Pain Score 11/07/19 1404 2     Pain Loc --      Pain Edu? --      Excl. in GC? --    Updated Vital Signs BP 123/77 (BP Location: Right Arm)   Pulse 83   Temp 98.6 F (37 C) (Oral)   Resp  18   Ht 5\' 3"  (1.6 m)   Wt 74.8 kg   LMP 10/25/2019   SpO2 99%   BMI 29.23 kg/m   Visual Acuity Right Eye Distance:   Left Eye Distance:   Bilateral Distance:    Right Eye Near:   Left Eye Near:    Bilateral Near:     Physical Exam Vitals and nursing note reviewed.  Constitutional:      General: She is not in acute distress.    Appearance: Normal appearance. She is not ill-appearing.  HENT:     Head: Normocephalic and atraumatic.  Cardiovascular:     Rate and Rhythm: Normal rate and regular rhythm.     Heart sounds: No murmur.  Pulmonary:     Effort: Pulmonary effort is normal.     Breath sounds: Normal breath sounds. No wheezing, rhonchi or rales.  Neurological:     Mental Status: She is alert.  Psychiatric:        Mood and Affect: Mood normal.        Behavior: Behavior normal.    UC Treatments / Results  Labs (all labs ordered are listed, but only abnormal results are displayed) Labs Reviewed  URINALYSIS, COMPLETE (UACMP) WITH MICROSCOPIC - Abnormal; Notable for the following components:      Result Value   Nitrite POSITIVE (*)    Leukocytes,Ua TRACE (*)    Bacteria, UA MANY (*)    All other components within normal  limits  URINE CULTURE    EKG   Radiology No results found.  Procedures Procedures (including critical care time)  Medications Ordered in UC Medications - No data to display  Initial Impression / Assessment and Plan / UC Course  I have reviewed the triage vital signs and the nursing notes.  Pertinent labs & imaging results that were available during my care of the patient were reviewed by me and considered in my medical decision making (see chart for details).    34 year old female presents with UTI.  Urinalysis was positive nitrite and trace leukocytes.  Many bacteria on microscopy but no significant pyuria.  Sending culture.  Placing on Keflex.  Final Clinical Impressions(s) / UC Diagnoses   Final diagnoses:  Acute cystitis without hematuria   Discharge Instructions   None    ED Prescriptions    Medication Sig Dispense Auth. Provider   cephALEXin (KEFLEX) 500 MG capsule Take 1 capsule (500 mg total) by mouth 2 (two) times daily. 14 capsule Thersa Salt G, DO     PDMP not reviewed this encounter.   Coral Spikes, [place] 11/07/19 2039

## 2019-11-07 NOTE — ED Triage Notes (Signed)
Patient c/o dysuria and urinary urgency that started 3 days ago.

## 2019-11-09 LAB — URINE CULTURE: Culture: >=100000 — AB

## 2019-11-13 ENCOUNTER — Other Ambulatory Visit: Payer: Self-pay

## 2019-11-13 ENCOUNTER — Ambulatory Visit (INDEPENDENT_AMBULATORY_CARE_PROVIDER_SITE_OTHER): Payer: Medicaid Other | Admitting: Nurse Practitioner

## 2019-11-13 ENCOUNTER — Encounter: Payer: Self-pay | Admitting: Nurse Practitioner

## 2019-11-13 VITALS — BP 114/73 | HR 73 | Temp 99.0°F | Ht 63.3 in | Wt 168.2 lb

## 2019-11-13 DIAGNOSIS — F32A Depression, unspecified: Secondary | ICD-10-CM | POA: Insufficient documentation

## 2019-11-13 DIAGNOSIS — L659 Nonscarring hair loss, unspecified: Secondary | ICD-10-CM | POA: Insufficient documentation

## 2019-11-13 DIAGNOSIS — Z30011 Encounter for initial prescription of contraceptive pills: Secondary | ICD-10-CM | POA: Diagnosis not present

## 2019-11-13 DIAGNOSIS — R4586 Emotional lability: Secondary | ICD-10-CM

## 2019-11-13 DIAGNOSIS — F418 Other specified anxiety disorders: Secondary | ICD-10-CM | POA: Insufficient documentation

## 2019-11-13 MED ORDER — SERTRALINE HCL 25 MG PO TABS: 25.0000 mg | ORAL_TABLET | Freq: Every day | ORAL | 0 refills | Status: DC

## 2019-11-13 MED ORDER — NORGESTIMATE-ETH ESTRADIOL 0.25-35 MG-MCG PO TABS: 1.0000 | ORAL_TABLET | Freq: Every day | ORAL | 3 refills | Status: DC

## 2019-11-13 NOTE — Assessment & Plan Note (Signed)
Chronic, ongoing.  PHQ9 and GAD7 elevated in clinic today.  After discussion about options of medication and alternative therapies, patient agreeable to start medication.  Will start sertraline 25 mg daily for mood.  Also discussed other options with therapy and social worker, patient declines for now and will reassess at follow up.  Follow up on mood in 4 weeks.  Advised to call or return to clinic with any new concerns or questions.

## 2019-11-13 NOTE — Patient Instructions (Addendum)
Sertraline tablets What is this medicine? SERTRALINE (SER tra leen) is used to treat depression. It may also be used to treat obsessive compulsive disorder, panic disorder, post-trauma stress, premenstrual dysphoric disorder (PMDD) or social anxiety. This medicine may be used for other purposes; ask your health care provider or pharmacist if you have questions. COMMON BRAND NAME(S): Zoloft What should I tell my health care provider before I take this medicine? They need to know if you have any of these conditions:  bleeding disorders  bipolar disorder or a family history of bipolar disorder  glaucoma  heart disease  high blood pressure  history of irregular heartbeat  history of low levels of calcium, magnesium, or potassium in the blood  if you often drink alcohol  liver disease  receiving electroconvulsive therapy  seizures  suicidal thoughts, plans, or attempt; a previous suicide attempt by you or a family member  take medicines that treat or prevent blood clots  thyroid disease  an unusual or allergic reaction to sertraline, other medicines, foods, dyes, or preservatives  pregnant or trying to get pregnant  breast-feeding How should I use this medicine? Take this medicine by mouth with a glass of water. Follow the directions on the prescription label. You can take it with or without food. Take your medicine at regular intervals. Do not take your medicine more often than directed. Do not stop taking this medicine suddenly except upon the advice of your doctor. Stopping this medicine too quickly may cause serious side effects or your condition may worsen. A special MedGuide will be given to you by the pharmacist with each prescription and refill. Be sure to read this information carefully each time. Talk to your pediatrician regarding the use of this medicine in children. While this drug may be prescribed for children as young as 7 years for selected conditions,  precautions do apply. Overdosage: If you think you have taken too much of this medicine contact a poison control center or emergency room at once. NOTE: This medicine is only for you. Do not share this medicine with others. What if I miss a dose? If you miss a dose, take it as soon as you can. If it is almost time for your next dose, take only that dose. Do not take double or extra doses. What may interact with this medicine? Do not take this medicine with any of the following medications:  cisapride  dronedarone  linezolid  MAOIs like Carbex, Eldepryl, Marplan, Nardil, and Parnate  methylene blue (injected into a vein)  pimozide  thioridazine This medicine may also interact with the following medications:  alcohol  amphetamines  aspirin and aspirin-like medicines  certain medicines for depression, anxiety, or psychotic disturbances  certain medicines for fungal infections like ketoconazole, fluconazole, posaconazole, and itraconazole  certain medicines for irregular heart beat like flecainide, quinidine, propafenone  certain medicines for migraine headaches like almotriptan, eletriptan, frovatriptan, naratriptan, rizatriptan, sumatriptan, zolmitriptan  certain medicines for sleep  certain medicines for seizures like carbamazepine, valproic acid, phenytoin  certain medicines that treat or prevent blood clots like warfarin, enoxaparin, dalteparin  cimetidine  digoxin  diuretics  fentanyl  isoniazid  lithium  NSAIDs, medicines for pain and inflammation, like ibuprofen or naproxen  other medicines that prolong the QT interval (cause an abnormal heart rhythm) like dofetilide  rasagiline  safinamide  supplements like St. John's wort, kava kava, valerian  tolbutamide  tramadol  tryptophan This list may not describe all possible interactions. Give your health care provider   a list of all the medicines, herbs, non-prescription drugs, or dietary supplements  you use. Also tell them if you smoke, drink alcohol, or use illegal drugs. Some items may interact with your medicine. What should I watch for while using this medicine? Tell your doctor if your symptoms do not get better or if they get worse. Visit your doctor or health care professional for regular checks on your progress. Because it may take several weeks to see the full effects of this medicine, it is important to continue your treatment as prescribed by your doctor. Patients and their families should watch out for new or worsening thoughts of suicide or depression. Also watch out for sudden changes in feelings such as feeling anxious, agitated, panicky, irritable, hostile, aggressive, impulsive, severely restless, overly excited and hyperactive, or not being able to sleep. If this happens, especially at the beginning of treatment or after a change in dose, call your health care professional. You may get drowsy or dizzy. Do not drive, use machinery, or do anything that needs mental alertness until you know how this medicine affects you. Do not stand or sit up quickly, especially if you are an older patient. This reduces the risk of dizzy or fainting spells. Alcohol may interfere with the effect of this medicine. Avoid alcoholic drinks. Your mouth may get dry. Chewing sugarless gum or sucking hard candy, and drinking plenty of water may help. Contact your doctor if the problem does not go away or is severe. What side effects may I notice from receiving this medicine? Side effects that you should report to your doctor or health care professional as soon as possible:  allergic reactions like skin rash, itching or hives, swelling of the face, lips, or tongue  anxious  black, tarry stools  changes in vision  confusion  elevated mood, decreased need for sleep, racing thoughts, impulsive behavior  eye pain  fast, irregular heartbeat  feeling faint or lightheaded, falls  feeling agitated,  angry, or irritable  hallucination, loss of contact with reality  loss of balance or coordination  loss of memory  painful or prolonged erections  restlessness, pacing, inability to keep still  seizures  stiff muscles  suicidal thoughts or other mood changes  trouble sleeping  unusual bleeding or bruising  unusually weak or tired  vomiting Side effects that usually do not require medical attention (report to your doctor or health care professional if they continue or are bothersome):  change in appetite or weight  change in sex drive or performance  diarrhea  increased sweating  indigestion, nausea  tremors This list may not describe all possible side effects. Call your doctor for medical advice about side effects. You may report side effects to FDA at 1-800-FDA-1088. Where should I keep my medicine? Keep out of the reach of children. Store at room temperature between 15 and 30 degrees C (59 and 86 degrees F). Throw away any unused medicine after the expiration date. NOTE: This sheet is a summary. It may not cover all possible information. If you have questions about this medicine, talk to your doctor, pharmacist, or health care provider.  2020 Elsevier/Gold Standard (2018-07-04 10:09:27)  

## 2019-11-13 NOTE — Assessment & Plan Note (Signed)
Chronic, ongoing.  Will restart OCP and monitor symptom.  Offered TSH check today, patient declined TSH check until upcoming physical.

## 2019-11-13 NOTE — Progress Notes (Signed)
BP 114/73   Pulse 73   Temp 99 F (37.2 C) (Oral)   Ht 5' 3.3" (1.608 m)   Wt 168 lb 3.2 oz (76.3 kg)   LMP 10/25/2019   SpO2 98%   BMI 29.51 kg/m    Subjective:    Patient ID: Maureen Hoffman, female    DOB: June 07, 1986, 34 y.o.   MRN: [id number]  HPI: Maureen Hoffman is a 34 y.o. female presents for new patient visit to establish care.  Introduced to Publishing rights manager role and practice setting.  All questions answered.  Chief Complaint  Patient presents with  . Establish Care    pt states she would like to dicsuss contraception and irritability   Patient states she would like to talk about birth control.  She does not want any more children and states that last April, she went off of her birth control pill because she was not able to follow up with her primary care provider due to the COVID-19 pandemic.  She noticed that when she went off of the birth control pill, her hair started falling out in large clumps.  Prior to last April, she had been on a long-term OCP since the age of 26.  ANXIETY/STRESS Also states that she has been having significant mood swings.  After the birth of her second child in 2018, she feels her overall mood has changed.  She admits to a difficulty pregnancy and delivery.  She feels she has gotten a lot more forgetful and that her brain is all over the place and she gets distracted easily.  She gets more irritated with children and significant other when she feels that she normally would not feel that way. Duration:uncontrolled Anxious mood: yes  Excessive worrying: yes Irritability: yes  Sweating: no Nausea: no Palpitations:no Hyperventilation: no Panic attacks: no Agoraphobia: no  Obscessions/compulsions: no Depressed mood: no Depression screen [facility] 2/9 11/13/2019  Decreased Interest 0  Down, Depressed, Hopeless 0  PHQ - 2 Score 0  Altered sleeping 1  Tired, decreased energy 1  Change in appetite 1  Feeling bad or failure about yourself  0    Trouble concentrating 1  Moving slowly or fidgety/restless 0  Suicidal thoughts 0  PHQ-9 Score 4  Difficult doing work/chores Somewhat difficult   GAD 7 : Generalized Anxiety Score 11/13/2019  Nervous, Anxious, on Edge 0  Control/stop worrying 3  Worry too much - different things 3  Trouble relaxing 1  Restless 0  Easily annoyed or irritable 3  Afraid - awful might happen 0  Total GAD 7 Score 10  Anxiety Difficulty Somewhat difficult   Anhedonia: no Weight changes: yes Insomnia: no   Hypersomnia: no Fatigue/loss of energy: yes Feelings of worthlessness: no Feelings of guilt: no Impaired concentration/indecisiveness: yes Suicidal ideations: no  Crying spells: no Recent Stressors/Life Changes: no   Relationship problems: no   Family stress: no     Financial stress: no    Job stress: no    Recent death/loss: no In the past, she has tried Lexapro for mood and did not have a good reaction to it.  Allergies  Allergen Reactions  . Eggs Or Egg-Derived Products     Other reaction(s): Unknown   Outpatient Encounter Medications as of 11/13/2019  Medication Sig  . Biotin 1000 MCG CHEW Chew 1,000 mcg by mouth daily.  . cephALEXin (KEFLEX) 500 MG capsule Take 1 capsule (500 mg total) by mouth 2 (two) times daily.  . Omega-3  Fatty Acids (FISH OIL) 1000 MG CAPS Take 1,000 mg by mouth daily.  . vitamin C (ASCORBIC ACID) 250 MG tablet Take 250 mg by mouth daily.  . norgestimate-ethinyl estradiol (ORTHO-CYCLEN) 0.25-35 MG-MCG tablet Take 1 tablet by mouth daily.  . sertraline (ZOLOFT) 25 MG tablet Take 1 tablet (25 mg total) by mouth daily.  . [DISCONTINUED] norethindrone (ORTHO MICRONOR) 0.35 MG tablet Take 1 tablet (0.35 mg total) by mouth daily.   No facility-administered encounter medications on file as of 11/13/2019.   Active Ambulatory Problems    Diagnosis Date Noted  . Thrombocytopenia (HCC) 12/03/2016  . Mood changes 11/13/2019  . Hair loss 11/13/2019   Resolved  Ambulatory Problems    Diagnosis Date Noted  . First trimester screening 06/07/2016  . Supervision of high risk pregnancy in third trimester 12/03/2016  . Thrombocytopenia affecting pregnancy, antepartum (HCC) 12/03/2016  . Encounter for induction of labor 12/04/2016   Past Medical History:  Diagnosis Date  . Seizures (HCC)   . Thrombocytopenia complicating pregnancy [facility])    Past Medical History:  Diagnosis Date  . Seizures (HCC)    as a child  . Thrombocytopenia affecting pregnancy, antepartum (HCC) 12/03/2016  . Thrombocytopenia complicating pregnancy (HCC)    2018   Past Surgical History:  Procedure Laterality Date  . NO PAST SURGERIES     Family History  Problem Relation Age of Onset  . Healthy Mother   . Healthy Father   . Hashimoto's thyroiditis Sister   . Breast cancer Maternal Grandmother   . Diabetes Maternal Grandmother   . Breast cancer Paternal Grandmother   . COPD Paternal Grandmother   . Brain cancer Paternal Grandfather    Social History   Socioeconomic History  . Marital status: Single    Spouse name: Not on file  . Number of children: Not on file  . Years of education: Not on file  . Highest education level: Not on file  Occupational History  . Not on file  Tobacco Use  . Smoking status: Former Smoker    Types: Cigarettes    Quit date: 07/13/2010    Years since quitting: 9.3  . Smokeless tobacco: Never Used  Substance and Sexual Activity  . Alcohol use: No  . Drug use: No  . Sexual activity: Yes    Birth control/protection: None  Other Topics Concern  . Not on file  Social History Narrative  . Not on file   Social Determinants of Health   Financial Resource Strain:   . Difficulty of Paying Living Expenses:   Food Insecurity:   . Worried About Programme researcher, broadcasting/film/video in the Last Year:   . Barista in the Last Year:   Transportation Needs:   . Freight forwarder (Medical):   Marland Kitchen Lack of Transportation (Non-Medical):   Physical  Activity:   . Days of Exercise per Week:   . Minutes of Exercise per Session:   Stress:   . Feeling of Stress :   Social Connections:   . Frequency of Communication with Friends and Family:   . Frequency of Social Gatherings with Friends and Family:   . Attends Religious Services:   . Active Member of Clubs or Organizations:   . Attends Banker Meetings:   Marland Kitchen Marital Status:    Review of Systems  Constitutional: Positive for fatigue and unexpected weight change. Negative for activity change and fever.  Respiratory: Negative.  Negative for cough, shortness of breath and  wheezing.   Cardiovascular: Negative.  Negative for chest pain and palpitations.  Gastrointestinal: Negative for nausea and vomiting.  Endocrine: Negative.  Negative for cold intolerance, heat intolerance and polyuria.  Skin: Negative.  Negative for color change, pallor and rash.  Neurological: Negative.  Negative for dizziness, weakness, light-headedness and headaches.  Psychiatric/Behavioral: Positive for agitation, decreased concentration and sleep disturbance. Negative for behavioral problems, confusion, self-injury and suicidal ideas. The patient is nervous/anxious. The patient is not hyperactive.     Per HPI unless specifically indicated above     Objective:    BP 114/73   Pulse 73   Temp 99 F (37.2 C) (Oral)   Ht 5' 3.3" (1.608 m)   Wt 168 lb 3.2 oz (76.3 kg)   LMP 10/25/2019   SpO2 98%   BMI 29.51 kg/m   Wt Readings from Last 3 Encounters:  11/13/19 168 lb 3.2 oz (76.3 kg)  11/07/19 165 lb (74.8 kg)  08/11/18 175 lb (79.4 kg)    Physical Exam Vitals and nursing note reviewed.  Constitutional:      General: She is not in acute distress.    Appearance: Normal appearance. She is not toxic-appearing.  Cardiovascular:     Rate and Rhythm: Normal rate and regular rhythm.     Heart sounds: Normal heart sounds. No murmur.  Pulmonary:     Effort: Pulmonary effort is normal. No  respiratory distress.     Breath sounds: Normal breath sounds. No wheezing or rhonchi.  Abdominal:     General: Abdomen is flat. Bowel sounds are normal.  Skin:    General: Skin is warm and dry.     Coloration: Skin is not jaundiced or pale.  Neurological:     General: No focal deficit present.     Mental Status: She is alert and oriented to person, place, and time.     Motor: No weakness.     Gait: Gait normal.  Psychiatric:        Mood and Affect: Mood normal.        Behavior: Behavior normal.        Thought Content: Thought content normal.        Judgment: Judgment normal.       Assessment & Plan:   Problem List Items Addressed This Visit      Other   Mood changes - Primary    Chronic, ongoing.  PHQ9 and GAD7 elevated in clinic today.  After discussion about options of medication and alternative therapies, patient agreeable to start medication.  Will start sertraline 25 mg daily for mood.  Also discussed other options with therapy and social worker, patient declines for now and will reassess at follow up.  Follow up on mood in 4 weeks.  Advised to call or return to clinic with any new concerns or questions.      Relevant Medications   sertraline (ZOLOFT) 25 MG tablet   Hair loss    Chronic, ongoing.  Will restart OCP and monitor symptom.  Offered TSH check today, patient declined TSH check until upcoming physical.       Other Visit Diagnoses    Encounter for initial prescription of contraceptive pills       Will resume Ortho-cyclen that had done well with in the past.  Advised to start the first Sunday after next menstrual period.  Patient to call with any concerns       Follow up plan: Return in about 4 weeks (around 12/11/2019)  for mood follow up.   Time: 60 minutes, >50% spent counseling/or care coordination

## 2019-12-11 ENCOUNTER — Encounter: Payer: Self-pay | Admitting: Nurse Practitioner

## 2019-12-11 ENCOUNTER — Ambulatory Visit (INDEPENDENT_AMBULATORY_CARE_PROVIDER_SITE_OTHER): Payer: Medicaid Other | Admitting: Nurse Practitioner

## 2019-12-11 ENCOUNTER — Other Ambulatory Visit: Payer: Self-pay

## 2019-12-11 VITALS — BP 126/84 | HR 73 | Temp 98.2°F | Ht 63.0 in | Wt 165.0 lb

## 2019-12-11 DIAGNOSIS — F411 Generalized anxiety disorder: Secondary | ICD-10-CM

## 2019-12-11 DIAGNOSIS — Z862 Personal history of diseases of the blood and blood-forming organs and certain disorders involving the immune mechanism: Secondary | ICD-10-CM | POA: Diagnosis not present

## 2019-12-11 DIAGNOSIS — R4586 Emotional lability: Secondary | ICD-10-CM | POA: Diagnosis not present

## 2019-12-11 MED ORDER — SERTRALINE HCL 25 MG PO TABS: 25.0000 mg | ORAL_TABLET | Freq: Every day | ORAL | 1 refills | Status: DC

## 2019-12-11 NOTE — Progress Notes (Signed)
BP 126/84 (BP Location: Left Arm, Patient Position: Sitting, Cuff Size: Normal)   Pulse 73   Temp 98.2 F (36.8 C) (Oral)   Ht 5\' 3"  (1.6 m)   Wt 165 lb (74.8 kg)   SpO2 98%   BMI 29.23 kg/m    Subjective:    Patient ID: , female    DOB: [birth year], 34 y.o.   MRN: [id number]  HPI: Maureen Hoffman is a 34 y.o. female presents mood follow up.  Chief Complaint  Patient presents with  . Anxiety    No complaints  . Stress   ANXIETY/STRESS Patient states that the first two weeks, the medication was working very well.  The most recent two weeks, she has been more restless and also had quite a bit of stress the past couple of weeks.     She feels that the mood swings are better.   Duration:controlled Anxious mood: yes  Excessive worrying: yes Irritability: no  Sweating: no Nausea: no Palpitations:no Hyperventilation: no Panic attacks: no Agoraphobia: no  Obscessions/compulsions: no Depressed mood: no Depression screen [facility] 2/9 12/11/2019 11/13/2019  Decreased Interest 0 0  Down, Depressed, Hopeless 0 0  PHQ - 2 Score 0 0  Altered sleeping 2 1  Tired, decreased energy 1 1  Change in appetite 0 1  Feeling bad or failure about yourself  0 0  Trouble concentrating 0 1  Moving slowly or fidgety/restless 0 0  Suicidal thoughts 0 0  PHQ-9 Score 3 4  Difficult doing work/chores Somewhat difficult Somewhat difficult   GAD 7 : Generalized Anxiety Score 12/11/2019 11/13/2019  Nervous, Anxious, on Edge 1 0  Control/stop worrying 1 3  Worry too much - different things 1 3  Trouble relaxing 1 1  Restless 0 0  Easily annoyed or irritable 1 3  Afraid - awful might happen 0 0  Total GAD 7 Score 5 10  Anxiety Difficulty Somewhat difficult Somewhat difficult  Anhedonia: no Weight changes: no Insomnia: no   Hypersomnia: no Fatigue/loss of energy: yes Feelings of worthlessness: no Feelings of guilt: no Impaired concentration/indecisiveness: no Suicidal  ideations: no  Crying spells: no Recent Stressors/Life Changes: no   Relationship problems: no   Family stress: no     Financial stress: no    Job stress: no    Recent death/loss: no  She is interested in the COVID vaccine but is worried because during her most recent pregnancy, she had low platelets.  She would like to get her platelets checked for "peace of mind" prior to receiving the COVID vaccination.  Allergies  Allergen Reactions  . Eggs Or Egg-Derived Products     Other reaction(s): Unknown   Outpatient Encounter Medications as of 12/11/2019  Medication Sig  . Biotin 1000 MCG CHEW Chew 1,000 mcg by mouth daily.  . norgestimate-ethinyl estradiol (ORTHO-CYCLEN) 0.25-35 MG-MCG tablet Take 1 tablet by mouth daily.  . Omega-3 Fatty Acids (FISH OIL) 1000 MG CAPS Take 1,000 mg by mouth daily.  . sertraline (ZOLOFT) 25 MG tablet Take 1 tablet (25 mg total) by mouth daily.  . vitamin C (ASCORBIC ACID) 250 MG tablet Take 250 mg by mouth daily.  . [DISCONTINUED] sertraline (ZOLOFT) 25 MG tablet Take 1 tablet (25 mg total) by mouth daily.  . [DISCONTINUED] cephALEXin (KEFLEX) 500 MG capsule Take 1 capsule (500 mg total) by mouth 2 (two) times daily. (Patient not taking: Reported on 12/11/2019)   No facility-administered encounter medications on file as of  12/11/2019.   Patient Active Problem List   Diagnosis Date Noted  . Mood changes 11/13/2019  . Hair loss 11/13/2019  . Thrombocytopenia ([place]) 12/03/2016   Past Medical History:  Diagnosis Date  . Seizures ([place])    as a child  . Thrombocytopenia affecting pregnancy, antepartum ([place]) 12/03/2016  . Thrombocytopenia complicating pregnancy ([place])    2018   Relevant past medical, surgical, family and social history reviewed and updated as indicated. Interim medical history since our last visit reviewed.  Review of Systems  Constitutional: Positive for fatigue. Negative for activity change, fever and unexpected weight change.    Gastrointestinal: Negative for nausea and vomiting.  Endocrine: Negative.  Negative for cold intolerance, heat intolerance and polyuria.  Skin: Negative.  Negative for color change, pallor and rash.  Neurological: Negative.  Negative for dizziness, weakness, light-headedness and headaches.  Psychiatric/Behavioral: Positive for sleep disturbance. Negative for agitation, behavioral problems, confusion, decreased concentration, self-injury and suicidal ideas. The patient is nervous/anxious. The patient is not hyperactive.    Per HPI unless specifically indicated above     Objective:    BP 126/84 (BP Location: Left Arm, Patient Position: Sitting, Cuff Size: Normal)   Pulse 73   Temp 98.2 F (36.8 C) (Oral)   Ht 5\' 3"  (1.6 m)   Wt 165 lb (74.8 kg)   SpO2 98%   BMI 29.23 kg/m   Wt Readings from Last 3 Encounters:  12/11/19 165 lb (74.8 kg)  11/13/19 168 lb 3.2 oz (76.3 kg)  11/07/19 165 lb (74.8 kg)    Physical Exam Vitals and nursing note reviewed.  Constitutional:      General: She is not in acute distress.    Appearance: Normal appearance. She is not toxic-appearing.  Cardiovascular:     Rate and Rhythm: Normal rate.  Pulmonary:     Effort: Pulmonary effort is normal. No respiratory distress.  Abdominal:     General: Abdomen is flat. There is no distension.  Skin:    General: Skin is warm and dry.     Coloration: Skin is not jaundiced or pale.  Neurological:     General: No focal deficit present.     Mental Status: She is alert and oriented to person, place, and time.     Motor: No weakness.     Gait: Gait normal.  Psychiatric:        Mood and Affect: Mood normal.        Behavior: Behavior normal.        Thought Content: Thought content normal.        Judgment: Judgment normal.       Assessment & Plan:   Problem List Items Addressed This Visit      Other   Mood changes - Primary   Relevant Medications   sertraline (ZOLOFT) 25 MG tablet   Other Relevant  Orders   CBC with Differential/Platelet       Follow up plan: Return in about 6 months (around 06/12/2020) for mood f/u and cpe.

## 2019-12-11 NOTE — Assessment & Plan Note (Signed)
Chronic, stable.  Both PHQ-9 and GAD-7 improved from previous visit, still slightly elevated.  Will continue sertraline at 25 mg for now per patient request .  Agreeable to increase sertraline to 50 mg if patient desires in future, just ask that she return to clinic about 1 month after for follow up.  Patient verbalized understanding.

## 2019-12-11 NOTE — Patient Instructions (Signed)
Managing Loss, Adult People experience loss in many different ways throughout their lives. Events such as moving, changing jobs, and losing friends can create a sense of loss. The loss may be as serious as a major health change, divorce, death of a pet, or death of a loved one. All of these types of loss are likely to create a physical and emotional reaction known as grief. Grief is the result of a major change or an absence of something or someone that you count on. Grief is a normal reaction to loss. A variety of factors can affect your grieving experience, including:  The nature of your loss.  Your relationship to what or whom you lost.  Your understanding of grief and how to manage it.  Your support system. How to manage lifestyle changes Keep to your normal routine as much as possible.  If you have trouble focusing or doing normal activities, it is acceptable to take some time away from your normal routine.  Spend time with friends and loved ones.  Eat a healthy diet, get plenty of sleep, and rest when you feel tired. How to recognize changes  The way that you deal with your grief will affect your ability to function as you normally do. When grieving, you may experience these changes:  Numbness, shock, sadness, anxiety, anger, denial, and guilt.  Thoughts about death.  Unexpected crying.  A physical sensation of emptiness in your stomach.  Problems sleeping and eating.  Tiredness (fatigue).  Loss of interest in normal activities.  Dreaming about or imagining seeing the person who died.  A need to remember what or whom you lost.  Difficulty thinking about anything other than your loss for a period of time.  Relief. If you have been expecting the loss for a while, you may feel a sense of relief when it happens. Follow these instructions at home:  Activity Express your feelings in healthy ways, such as:  Talking with others about your loss. It may be helpful to find  others who have had a similar loss, such as a support group.  Writing down your feelings in a journal.  Doing physical activities to release stress and emotional energy.  Doing creative activities like painting, sculpting, or playing or listening to music.  Practicing resilience. This is the ability to recover and adjust after facing challenges. Reading some resources that encourage resilience may help you to learn ways to practice those behaviors. General instructions  Be patient with yourself and others. Allow the grieving process to happen, and remember that grieving takes time. ? It is likely that you may never feel completely done with some grief. You may find a way to move on while still cherishing memories and feelings about your loss. ? Accepting your loss is a process. It can take months or longer to adjust.  Keep all follow-up visits as told by your health care provider. This is important. Where to find support To get support for managing loss:  Ask your health care provider for help and recommendations, such as grief counseling or therapy.  Think about joining a support group for people who are managing a loss. Where to find more information You can find more information about managing loss from:  American Society of Clinical Oncology: www.cancer.net  American Psychological Association: www.apa.org Contact a health care provider if:  Your grief is extreme and keeps getting worse.  You have ongoing grief that does not improve.  Your body shows symptoms of grief, such   as illness.  You feel depressed, anxious, or lonely. Get help right away if:  You have thoughts about hurting yourself or others. If you ever feel like you may hurt yourself or others, or have thoughts about taking your own life, get help right away. You can go to your nearest emergency department or call:  Your local emergency services (911 in the [place]  A suicide crisis helpline, such as the  National Suicide Prevention Lifeline at 1-800-273-8255. This is open 24 hours a day. Summary  Grief is the result of a major change or an absence of someone or something that you count on. Grief is a normal reaction to loss.  The depth of grief and the period of recovery depend on the type of loss and your ability to adjust to the change and process your feelings.  Processing grief requires patience and a willingness to accept your feelings and talk about your loss with people who are supportive.  It is important to find resources that work for you and to realize that people experience grief differently. There is not one grieving process that works for everyone in the same way.  Be aware that when grief becomes extreme, it can lead to more severe issues like isolation, depression, anxiety, or suicidal thoughts. Talk with your health care provider if you have any of these issues. This information is not intended to replace advice given to you by your health care provider. Make sure you discuss any questions you have with your health care provider. Document Revised: 09/15/2018 Document Reviewed: 11/25/2016 Elsevier Patient Education  2020 Elsevier Inc.  

## 2019-12-12 LAB — CBC WITH DIFFERENTIAL/PLATELET
Basophils Absolute: 0 10*3/uL (ref 0.0–0.2)
Basos: 0 %
EOS (ABSOLUTE): 0.1 10*3/uL (ref 0.0–0.4)
Eos: 2 %
Hematocrit: 40.9 % (ref 34.0–46.6)
Hemoglobin: 13.6 g/dL (ref 11.1–15.9)
Immature Grans (Abs): 0 10*3/uL (ref 0.0–0.1)
Immature Granulocytes: 0 %
Lymphocytes Absolute: 2.5 10*3/uL (ref 0.7–3.1)
Lymphs: 33 %
MCH: 31 pg (ref 26.6–33.0)
MCHC: 33.3 g/dL (ref 31.5–35.7)
MCV: 93 fL (ref 79–97)
Monocytes Absolute: 0.7 10*3/uL (ref 0.1–0.9)
Monocytes: 10 %
Neutrophils Absolute: 4.2 10*3/uL (ref 1.4–7.0)
Neutrophils: 55 %
Platelets: 155 10*3/uL (ref 150–450)
RBC: 4.39 x10E6/uL (ref 3.77–5.28)
RDW: 12.5 % (ref 11.7–15.4)
WBC: 7.6 10*3/uL (ref 3.4–10.8)

## 2020-01-03 ENCOUNTER — Encounter: Payer: Medicaid Other | Admitting: Nurse Practitioner

## 2020-01-16 ENCOUNTER — Encounter: Payer: Self-pay | Admitting: Nurse Practitioner

## 2020-01-17 ENCOUNTER — Encounter: Payer: Medicaid Other | Admitting: Nurse Practitioner

## 2020-01-21 ENCOUNTER — Other Ambulatory Visit: Payer: Self-pay | Admitting: Nurse Practitioner

## 2020-01-21 DIAGNOSIS — F411 Generalized anxiety disorder: Secondary | ICD-10-CM

## 2020-01-21 MED ORDER — SERTRALINE HCL 25 MG PO TABS: 25.0000 mg | ORAL_TABLET | Freq: Every day | ORAL | 0 refills | Status: DC

## 2020-01-22 ENCOUNTER — Other Ambulatory Visit (HOSPITAL_COMMUNITY)
Admission: RE | Admit: 2020-01-22 | Discharge: 2020-01-22 | Disposition: A | Discharge status: Home / Self Care | Payer: Medicaid Other | Source: Ambulatory Visit | Attending: Nurse Practitioner | Admitting: Nurse Practitioner

## 2020-01-22 ENCOUNTER — Encounter: Payer: Self-pay | Admitting: Nurse Practitioner

## 2020-01-22 ENCOUNTER — Other Ambulatory Visit: Payer: Self-pay

## 2020-01-22 ENCOUNTER — Ambulatory Visit (INDEPENDENT_AMBULATORY_CARE_PROVIDER_SITE_OTHER): Payer: Medicaid Other | Admitting: Nurse Practitioner

## 2020-01-22 VITALS — BP 125/77 | HR 79 | Temp 98.7°F | Ht 63.0 in | Wt 169.4 lb

## 2020-01-22 DIAGNOSIS — Z124 Encounter for screening for malignant neoplasm of cervix: Secondary | ICD-10-CM

## 2020-01-22 DIAGNOSIS — Z13220 Encounter for screening for lipoid disorders: Secondary | ICD-10-CM

## 2020-01-22 DIAGNOSIS — Z136 Encounter for screening for cardiovascular disorders: Secondary | ICD-10-CM

## 2020-01-22 DIAGNOSIS — B9689 Other specified bacterial agents as the cause of diseases classified elsewhere: Secondary | ICD-10-CM

## 2020-01-22 DIAGNOSIS — J301 Allergic rhinitis due to pollen: Secondary | ICD-10-CM | POA: Diagnosis not present

## 2020-01-22 DIAGNOSIS — Z0000 Encounter for general adult medical examination without abnormal findings: Secondary | ICD-10-CM | POA: Diagnosis not present

## 2020-01-22 DIAGNOSIS — F32A Depression, unspecified: Secondary | ICD-10-CM

## 2020-01-22 DIAGNOSIS — Z1329 Encounter for screening for other suspected endocrine disorder: Secondary | ICD-10-CM | POA: Diagnosis not present

## 2020-01-22 DIAGNOSIS — F419 Anxiety disorder, unspecified: Secondary | ICD-10-CM

## 2020-01-22 DIAGNOSIS — F329 Major depressive disorder, single episode, unspecified: Secondary | ICD-10-CM

## 2020-01-22 DIAGNOSIS — N760 Acute vaginitis: Secondary | ICD-10-CM

## 2020-01-22 MED ORDER — FLUTICASONE PROPIONATE 50 MCG/ACT NA SUSP: 2.0000 | Freq: Every day | NASAL | 6 refills | Status: DC

## 2020-01-22 MED ORDER — SERTRALINE HCL 100 MG PO TABS: 100.0000 mg | ORAL_TABLET | Freq: Every day | ORAL | 1 refills | Status: DC

## 2020-01-22 NOTE — Patient Instructions (Addendum)
Allergic Rhinitis, Adult Allergic rhinitis is a reaction to allergens in the air. Allergens are tiny specks (particles) in the air that cause your body to have an allergic reaction. This condition cannot be passed from person to person (is not contagious). Allergic rhinitis cannot be cured, but it can be controlled. There are two types of allergic rhinitis:  Seasonal. This type is also called hay fever. It happens only during certain times of the year.  Perennial. This type can happen at any time of the year. What are the causes? This condition may be caused by:  Pollen from grasses, trees, and weeds.  House dust mites.  Pet dander.  Mold. What are the signs or symptoms? Symptoms of this condition include:  Sneezing.  Runny or stuffy nose (nasal congestion).  A lot of mucus in the back of the throat (postnasal drip).  Itchy nose.  Tearing of the eyes.  Trouble sleeping.  Being sleepy during day. How is this treated? There is no cure for this condition. You should avoid things that trigger your symptoms (allergens). Treatment can help to relieve symptoms. This may include:  Medicines that block allergy symptoms, such as antihistamines. These may be given as a shot, nasal spray, or pill.  Shots that are given until your body becomes less sensitive to the allergen (desensitization).  Stronger medicines, if all other treatments have not worked. Follow these instructions at home: Avoiding allergens   Find out what you are allergic to. Common allergens include smoke, dust, and pollen.  Avoid them if you can. These are some of the things that you can do to avoid allergens: ? Replace carpet with wood, tile, or vinyl flooring. Carpet can trap dander and dust. ? Clean any mold found in the home. ? Do not smoke. Do not allow smoking in your home. ? Change your heating and air conditioning filter at least once a month. ? During allergy season:  Keep windows closed as much as  you can. If possible, use air conditioning when there is a lot of pollen in the air.  Use a special filter for allergies with your furnace and air conditioner.  Plan outdoor activities when pollen counts are lowest. This is usually during the early morning or evening hours.  If you do go outdoors when pollen count is high, wear a special mask for people with allergies.  When you come indoors, take a shower and change your clothes before sitting on furniture or bedding. General instructions  Do not use fans in your home.  Do not hang clothes outside to dry.  Wear sunglasses to keep pollen out of your eyes.  Wash your hands right away after you touch household pets.  Take over-the-counter and prescription medicines only as told by your doctor.  Keep all follow-up visits as told by your doctor. This is important. Contact a doctor if:  You have a fever.  You have a cough that does not go away (is persistent).  You start to make whistling sounds when you breathe (wheeze).  Your symptoms do not get better with treatment.  You have thick fluid coming from your nose.  You start to have nosebleeds. Get help right away if:  Your tongue or your lips are swollen.  You have trouble breathing.  You feel dizzy or you feel like you are going to pass out (faint).  You have cold sweats. Summary  Allergic rhinitis is a reaction to allergens in the air.  This condition may be  caused by allergens. These include pollen, dust mites, pet dander, and mold.  Symptoms include a runny, itchy nose, sneezing, or tearing eyes. You may also have trouble sleeping or feel sleepy during the day.  Treatment includes taking medicines and avoiding allergens. You may also get shots or take stronger medicines.  Get help if you have a fever or a cough that does not stop. Get help right away if you are short of breath. This information is not intended to replace advice given to you by your health care  provider. Make sure you discuss any questions you have with your health care provider. Document Revised: 10/31/2018 Document Reviewed: 01/31/2018 Elsevier Patient Education  2020 [place] 35-60 Years Old, Female Preventive care refers to visits with your health care provider and lifestyle choices that can promote health and wellness. This includes:  A yearly physical exam. This may also be called an annual well check.  Regular dental visits and eye exams.  Immunizations.  Screening for certain conditions.  Healthy lifestyle choices, such as eating a healthy diet, getting regular exercise, not using drugs or products that contain nicotine and tobacco, and limiting alcohol use. What can I expect for my preventive care visit? Physical exam Your health care provider will check your:  Height and weight. This may be used to calculate body mass index (BMI), which tells if you are at a healthy weight.  Heart rate and blood pressure.  Skin for abnormal spots. Counseling Your health care provider may ask you questions about your:  Alcohol, tobacco, and drug use.  Emotional well-being.  Home and relationship well-being.  Sexual activity.  Eating habits.  Work and work Statistician.  Method of birth control.  Menstrual cycle.  Pregnancy history. What immunizations do I need?  Influenza (flu) vaccine  This is recommended every year. Tetanus, diphtheria, and pertussis (Tdap) vaccine  You may need a Td booster every 10 years. Varicella (chickenpox) vaccine  You may need this if you have not been vaccinated. Human papillomavirus (HPV) vaccine  If recommended by your health care provider, you may need three doses over 6 months. Measles, mumps, and rubella (MMR) vaccine  You may need at least one dose of MMR. You may also need a second dose. Meningococcal conjugate (MenACWY) vaccine  One dose is recommended if you are age 62-21 years and a first-year  college student living in a residence hall, or if you have one of several medical conditions. You may also need additional booster doses. Pneumococcal conjugate (PCV13) vaccine  You may need this if you have certain conditions and were not previously vaccinated. Pneumococcal polysaccharide (PPSV23) vaccine  You may need one or two doses if you smoke cigarettes or if you have certain conditions. Hepatitis A vaccine  You may need this if you have certain conditions or if you travel or work in places where you may be exposed to hepatitis A. Hepatitis B vaccine  You may need this if you have certain conditions or if you travel or work in places where you may be exposed to hepatitis B. Haemophilus influenzae type b (Hib) vaccine  You may need this if you have certain conditions. You may receive vaccines as individual doses or as more than one vaccine together in one shot (combination vaccines). Talk with your health care provider about the risks and benefits of combination vaccines. What tests do I need?  Blood tests  Lipid and cholesterol levels. These may be checked every 5 years starting  at age 29.  Hepatitis C test.  Hepatitis B test. Screening  Diabetes screening. This is done by checking your blood sugar (glucose) after you have not eaten for a while (fasting).  Sexually transmitted disease (STD) testing.  BRCA-related cancer screening. This may be done if you have a family history of breast, ovarian, tubal, or peritoneal cancers.  Pelvic exam and Pap test. This may be done every 3 years starting at age 44. Starting at age 11, this may be done every 5 years if you have a Pap test in combination with an HPV test. Talk with your health care provider about your test results, treatment options, and if necessary, the need for more tests. Follow these instructions at home: Eating and drinking   Eat a diet that includes fresh fruits and vegetables, whole grains, lean protein, and  low-fat dairy.  Take vitamin and mineral supplements as recommended by your health care provider.  Do not drink alcohol if: ? Your health care provider tells you not to drink. ? You are pregnant, may be pregnant, or are planning to become pregnant.  If you drink alcohol: ? Limit how much you have to 0-1 drink a day. ? Be aware of how much alcohol is in your drink. In the [place] one drink equals one 12 oz bottle of beer (355 mL), one 5 oz glass of wine (148 mL), or one 1 oz glass of hard liquor (44 mL). Lifestyle  Take daily care of your teeth and gums.  Stay active. Exercise for at least 30 minutes on 5 or more days each week.  Do not use any products that contain nicotine or tobacco, such as cigarettes, e-cigarettes, and chewing tobacco. If you need help quitting, ask your health care provider.  If you are sexually active, practice safe sex. Use a condom or other form of birth control (contraception) in order to prevent pregnancy and STIs (sexually transmitted infections). If you plan to become pregnant, see your health care provider for a preconception visit. What's next?  Visit your health care provider once a year for a well check visit.  Ask your health care provider how often you should have your eyes and teeth checked.  Stay up to date on all vaccines. This information is not intended to replace advice given to you by your health care provider. Make sure you discuss any questions you have with your health care provider. Document Revised: 03/23/2018 Document Reviewed: 03/23/2018 Elsevier Patient Education  2020 [employer].

## 2020-01-22 NOTE — Progress Notes (Signed)
BP 125/77 (BP Location: Left Arm, Patient Position: Sitting, Cuff Size: Normal)   Pulse 79   Temp 98.7 F (37.1 C) (Oral)   Ht 5\' 3"  (1.6 m)   Wt 169 lb 6.4 oz (76.8 kg)   SpO2 100%   BMI 30.01 kg/m    Subjective:    Patient ID: , female    DOB: 08/05/1985, 34 y.o.   MRN: [id number]  HPI: Maureen Hoffman is a 34 y.o. female presenting on 01/22/2020 for comprehensive medical examination. Current medical complaints include: none  Patient reports she has been struggling with some allergies.  Also has been having some trouble sleeping lately, thinks it is situational.  She currently lives with: 2 boys and significant other LMP: ~01/08/2020  Depression Screen done today and results listed below:  Depression screen [facility] 2/9 01/22/2020 12/11/2019 11/13/2019  Decreased Interest 0 0 0  Down, Depressed, Hopeless 0 0 0  PHQ - 2 Score 0 0 0  Altered sleeping 3 2 1   Tired, decreased energy 1 1 1   Change in appetite 2 0 1  Feeling bad or failure about yourself  1 0 0  Trouble concentrating 0 0 1  Moving slowly or fidgety/restless 0 0 0  Suicidal thoughts 0 0 0  PHQ-9 Score 7 3 4   Difficult doing work/chores Somewhat difficult Somewhat difficult Somewhat difficult   The patient does not have a history of falls. I did not complete a risk assessment for falls. A plan of care for falls was not documented.  Past Medical History:  Past Medical History:  Diagnosis Date  . Seizures (HCC)    as a child  . Thrombocytopenia affecting pregnancy, antepartum (HCC) 12/03/2016  . Thrombocytopenia complicating pregnancy (HCC)    2018   Surgical History:  Past Surgical History:  Procedure Laterality Date  . NO PAST SURGERIES     Medications:  Current Outpatient Medications on File Prior to Visit  Medication Sig  . Biotin 1000 MCG CHEW Chew 1,000 mcg by mouth daily.  . norgestimate-ethinyl estradiol (ORTHO-CYCLEN) 0.25-35 MG-MCG tablet Take 1 tablet by mouth daily.  . Omega-3  Fatty Acids (FISH OIL) 1000 MG CAPS Take 1,000 mg by mouth daily.  . vitamin C (ASCORBIC ACID) 250 MG tablet Take 250 mg by mouth daily.   No current facility-administered medications on file prior to visit.   Allergies:  Allergies  Allergen Reactions  . Eggs Or Egg-Derived Products     Other reaction(s): Unknown   Social History:  Social History   Socioeconomic History  . Marital status: Single    Spouse name: Not on file  . Number of children: Not on file  . Years of education: Not on file  . Highest education level: Not on file  Occupational History  . Not on file  Tobacco Use  . Smoking status: Former Smoker    Types: Cigarettes    Quit date: 07/13/2010    Years since quitting: 9.5  . Smokeless tobacco: Never Used  Vaping Use  . Vaping Use: Never used  Substance and Sexual Activity  . Alcohol use: No  . Drug use: No  . Sexual activity: Yes    Birth control/protection: None  Other Topics Concern  . Not on file  Social History Narrative  . Not on file   Social Determinants of Health   Financial Resource Strain:   . Difficulty of Paying Living Expenses:   Food Insecurity:   . Worried About 02/02/2017  of Food in the Last Year:   . Ran Out of Food in the Last Year:   Transportation Needs:   . Lack of Transportation (Medical):   Marland Kitchen Lack of Transportation (Non-Medical):   Physical Activity:   . Days of Exercise per Week:   . Minutes of Exercise per Session:   Stress:   . Feeling of Stress :   Social Connections:   . Frequency of Communication with Friends and Family:   . Frequency of Social Gatherings with Friends and Family:   . Attends Religious Services:   . Active Member of Clubs or Organizations:   . Attends Banker Meetings:   Marland Kitchen Marital Status:   Intimate Partner Violence:   . Fear of Current or Ex-Partner:   . Emotionally Abused:   Marland Kitchen Physically Abused:   . Sexually Abused:    Social History   Tobacco Use  Smoking Status Former  Smoker  . Types: Cigarettes  . Quit date: 07/13/2010  . Years since quitting: 9.5  Smokeless Tobacco Never Used   Social History   Substance and Sexual Activity  Alcohol Use No   Family History:  Family History  Problem Relation Age of Onset  . Healthy Mother   . Healthy Father   . Hashimoto's thyroiditis Sister   . Breast cancer Maternal Grandmother   . Diabetes Maternal Grandmother   . Breast cancer Paternal Grandmother   . COPD Paternal Grandmother   . Brain cancer Paternal Grandfather     Past medical history, surgical history, medications, allergies, family history and social history reviewed with patient today and changes made to appropriate areas of the chart.   Review of Systems  Constitutional: Negative.  Negative for chills, fever and malaise/fatigue.  HENT: Negative.  Negative for congestion, ear pain, hearing loss, nosebleeds, sinus pain and sore throat.   Eyes: Negative.  Negative for blurred vision and double vision.  Respiratory: Negative.  Negative for cough, shortness of breath and wheezing.   Cardiovascular: Negative.  Negative for chest pain, palpitations and leg swelling.  Gastrointestinal: Negative.  Negative for abdominal pain, blood in stool, constipation, diarrhea, nausea and vomiting.  Genitourinary: Negative.  Negative for dysuria, frequency and urgency.  Musculoskeletal: Negative.  Negative for back pain, myalgias and neck pain.  Skin: Negative.  Negative for itching and rash.  Neurological: Negative.  Negative for dizziness, weakness and headaches.   Psychiatric/Behavioral: Negative for depression and suicidal ideas. The patient is not nervous/anxious. The patient does not have insomnia.    All other ROS negative except what is listed above and in the HPI.      Objective:    BP 125/77 (BP Location: Left Arm, Patient Position: Sitting, Cuff Size: Normal)   Pulse 79   Temp 98.7 F (37.1 C) (Oral)   Ht 5\' 3"  (1.6 m)   Wt 169 lb 6.4 oz (76.8  kg)   SpO2 100%   BMI 30.01 kg/m   Wt Readings from Last 3 Encounters:  01/22/20 169 lb 6.4 oz (76.8 kg)  12/11/19 165 lb (74.8 kg)  11/13/19 168 lb 3.2 oz (76.3 kg)    Physical Exam Vitals and nursing note reviewed. Exam conducted with a chaperone present.  Constitutional:      General: She is not in acute distress.    Appearance: Normal appearance. She is normal weight. She is not ill-appearing or toxic-appearing.  HENT:     Head: Normocephalic and atraumatic.     Right Ear: Tympanic membrane,  ear canal and external ear normal.     Left Ear: Tympanic membrane, ear canal erythematous; external ear normal.     Nose: Nose normal. No congestion or rhinorrhea.     Mouth/Throat:     Mouth: Mucous membranes are moist.     Pharynx: Oropharynx is clear. No oropharyngeal exudate.  Eyes:     General: No scleral icterus.    Extraocular Movements: Extraocular movements intact.     Pupils: Pupils are equal, round, and reactive to light.  Cardiovascular:     Rate and Rhythm: Normal rate and regular rhythm.     Pulses: Normal pulses.     Heart sounds: Normal heart sounds. No murmur heard.   Pulmonary:     Effort: Pulmonary effort is normal. No respiratory distress.     Breath sounds: No wheezing or rhonchi.  Chest:     Breasts:        Right: Normal. No inverted nipple, mass, nipple discharge or skin change.        Left: Normal. No inverted nipple, mass, nipple discharge or skin change.  Abdominal:     General: Abdomen is flat. Bowel sounds are normal. There is no distension.     Palpations: Abdomen is soft.     Tenderness: There is no abdominal tenderness.  Genitourinary:    General: Normal vulva.     Exam position: Lithotomy position.     Pubic Area: No rash.      Labia:        Right: No lesion.        Left: No lesion.      Vagina: Normal. No signs of injury.     Cervix: No cervical motion tenderness or friability.     Uterus: Normal.      Adnexa: Right adnexa normal and left  adnexa normal.  Musculoskeletal:        General: No swelling or tenderness. Normal range of motion.     Cervical back: Normal range of motion and neck supple. No rigidity or tenderness.     Right lower leg: No edema.     Left lower leg: No edema.  Lymphadenopathy:     Lower Body: No right inguinal adenopathy. No left inguinal adenopathy.  Skin:    General: Skin is warm and dry.     Capillary Refill: Capillary refill takes less than 2 seconds.     Coloration: Skin is not jaundiced or pale.  Neurological:     General: No focal deficit present.     Mental Status: She is alert and oriented to person, place, and time.     Motor: No weakness.     Gait: Gait normal.  Psychiatric:        Mood and Affect: Mood normal.        Behavior: Behavior normal.        Thought Content: Thought content normal.        Judgment: Judgment normal.       Assessment & Plan:   Problem List Items Addressed This Visit      Other   Anxiety and Depression  Recently increased sertraline to 100 mg daily.  Will continue at current dose for now.  Follow up in 4 weeks - patient to message with any questions or concerns in the meantime.   Other Visit Diagnoses    Annual physical exam    -  Primary   Relevant Orders   Comprehensive metabolic panel   CBC with Differential/Platelet  Cervical cancer screening       Relevant Orders   Cytology - PAP   Encounter for lipid screening for cardiovascular disease       Relevant Orders   Lipid Panel w/o Chol/HDL Ratio   Screening for thyroid disorder       Relevant Orders   TSH   Allergic rhinitis due to pollen, unspecified seasonality     Will treat with intranasal fluticasone.  Patient to call or return to clinic if symptoms persist or worsen.       Follow up plan: Return in about 4 weeks (around 02/19/2020) for mood f/u.   LABORATORY TESTING:   - Pap smear: pap done  IMMUNIZATIONS:   - Tdap: Tetanus vaccination status reviewed: last tetanus booster  within 10 years. - Influenza: Postponed to flu season - Pneumovax: Not applicable - Prevnar: Not applicable - HPV: Not applicable - Zostavax vaccine: Not applicable  SCREENING: -Mammogram: Not applicable  - Colonoscopy: Not applicable  - Bone Density: Not applicable  -Hearing Test: Not applicable  -Spirometry: Not applicable   PATIENT COUNSELING:   Advised to take 1 mg of folate supplement per day if capable of pregnancy.   Sexuality: Discussed sexually transmitted diseases, partner selection, use of condoms, avoidance of unintended pregnancy  and contraceptive alternatives.   Advised to avoid cigarette smoking.  I discussed with the patient that most people either abstain from alcohol or drink within safe limits (<=14/week and <=4 drinks/occasion for males, <=7/weeks and <= 3 drinks/occasion for females) and that the risk for alcohol disorders and other health effects rises proportionally with the number of drinks per week and how often a drinker exceeds daily limits.  Discussed cessation/primary prevention of drug use and availability of treatment for abuse.   Diet: Encouraged to adjust caloric intake to maintain  or achieve ideal body weight, to reduce intake of dietary saturated fat and total fat, to limit sodium intake by avoiding high sodium foods and not adding table salt, and to maintain adequate dietary potassium and calcium preferably from fresh fruits, vegetables, and low-fat dairy products.    stressed the importance of regular exercise  Injury prevention: Discussed safety belts, safety helmets, smoke detector, smoking near bedding or upholstery.   Dental health: Discussed importance of regular tooth brushing, flossing, and dental visits.    NEXT PREVENTATIVE PHYSICAL DUE IN 1 YEAR. Return in about 4 weeks (around 02/19/2020) for mood f/u.

## 2020-01-23 LAB — CBC WITH DIFFERENTIAL/PLATELET
Basophils Absolute: 0 10*3/uL (ref 0.0–0.2)
Basos: 1 %
EOS (ABSOLUTE): 0.2 10*3/uL (ref 0.0–0.4)
Eos: 3 %
Hematocrit: 37.3 % (ref 34.0–46.6)
Hemoglobin: 12.6 g/dL (ref 11.1–15.9)
Immature Grans (Abs): 0 10*3/uL (ref 0.0–0.1)
Immature Granulocytes: 0 %
Lymphocytes Absolute: 2.2 10*3/uL (ref 0.7–3.1)
Lymphs: 26 %
MCH: 30.7 pg (ref 26.6–33.0)
MCHC: 33.8 g/dL (ref 31.5–35.7)
MCV: 91 fL (ref 79–97)
Monocytes Absolute: 0.8 10*3/uL (ref 0.1–0.9)
Monocytes: 10 %
Neutrophils Absolute: 5 10*3/uL (ref 1.4–7.0)
Neutrophils: 60 %
Platelets: 207 10*3/uL (ref 150–450)
RBC: 4.1 x10E6/uL (ref 3.77–5.28)
RDW: 12.6 % (ref 11.7–15.4)
WBC: 8.3 10*3/uL (ref 3.4–10.8)

## 2020-01-23 LAB — COMPREHENSIVE METABOLIC PANEL
ALT: 9 IU/L (ref 0–32)
AST: 12 IU/L (ref 0–40)
Albumin/Globulin Ratio: 1.6 (ref 1.2–2.2)
Albumin: 4.4 g/dL (ref 3.8–4.8)
Alkaline Phosphatase: 71 IU/L (ref 48–121)
BUN/Creatinine Ratio: 17 (ref 9–23)
BUN: 11 mg/dL (ref 6–20)
Bilirubin Total: 0.3 mg/dL (ref 0.0–1.2)
CO2: 22 mmol/L (ref 20–29)
Calcium: 9.7 mg/dL (ref 8.7–10.2)
Chloride: 100 mmol/L (ref 96–106)
Creatinine, Ser: 0.63 mg/dL (ref 0.57–1.00)
GFR calc Af Amer: 135 mL/min/{1.73_m2} (ref >59)
GFR calc non Af Amer: 117 mL/min/{1.73_m2} (ref >59)
Globulin, Total: 2.8 g/dL (ref 1.5–4.5)
Glucose: 90 mg/dL (ref 65–99)
Potassium: 3.8 mmol/L (ref 3.5–5.2)
Sodium: 135 mmol/L (ref 134–144)
Total Protein: 7.2 g/dL (ref 6.0–8.5)

## 2020-01-23 LAB — TSH: TSH: 4.81 u[IU]/mL — ABNORMAL HIGH (ref 0.450–4.500)

## 2020-01-23 LAB — LIPID PANEL W/O CHOL/HDL RATIO
Cholesterol, Total: 214 mg/dL — ABNORMAL HIGH (ref 100–199)
HDL: 62 mg/dL (ref >39)
LDL Chol Calc (NIH): 118 mg/dL — ABNORMAL HIGH (ref 0–99)
Triglycerides: 199 mg/dL — ABNORMAL HIGH (ref 0–149)
VLDL Cholesterol Cal: 34 mg/dL (ref 5–40)

## 2020-01-24 LAB — CYTOLOGY - PAP
Comment: NEGATIVE
Diagnosis: NEGATIVE
High risk HPV: NEGATIVE

## 2020-01-29 MED ORDER — METRONIDAZOLE 1 % EX GEL: Freq: Every day | CUTANEOUS | 0 refills | Status: DC

## 2020-01-29 NOTE — Addendum Note (Signed)
Addended by: Mardene Celeste I on: 01/29/2020 09:40 AM   Modules accepted: Orders

## 2020-02-19 ENCOUNTER — Ambulatory Visit: Payer: Medicaid Other | Admitting: Nurse Practitioner

## 2020-02-21 ENCOUNTER — Encounter: Payer: Self-pay | Admitting: Nurse Practitioner

## 2020-02-22 ENCOUNTER — Other Ambulatory Visit: Payer: Self-pay | Admitting: Nurse Practitioner

## 2020-02-22 MED ORDER — METRONIDAZOLE 0.75 % VA GEL: 1.0000 | Freq: Every day | VAGINAL | 0 refills | Status: DC

## 2020-03-24 ENCOUNTER — Other Ambulatory Visit: Payer: Self-pay | Admitting: Nurse Practitioner

## 2020-03-24 MED ORDER — SERTRALINE HCL 100 MG PO TABS: 100.0000 mg | ORAL_TABLET | Freq: Every day | ORAL | 3 refills | Status: DC

## 2020-05-27 ENCOUNTER — Encounter: Payer: Self-pay | Admitting: Nurse Practitioner

## 2020-05-27 NOTE — Telephone Encounter (Signed)
Please Advise.  KP

## 2020-05-29 NOTE — Telephone Encounter (Signed)
Please call pharmacy and let them know it would be okay to fill before 11/9 and patient is out of her medication.

## 2020-05-29 NOTE — Telephone Encounter (Signed)
Pts response.  KP

## 2020-05-30 NOTE — Telephone Encounter (Signed)
Please Advise.  KP

## 2020-06-12 ENCOUNTER — Ambulatory Visit: Payer: Medicaid Other | Admitting: Nurse Practitioner

## 2020-06-14 ENCOUNTER — Telehealth: Payer: Medicaid Other | Admitting: Nurse Practitioner

## 2020-06-14 DIAGNOSIS — N3000 Acute cystitis without hematuria: Secondary | ICD-10-CM

## 2020-06-14 MED ORDER — CEPHALEXIN 500 MG PO CAPS: 500.0000 mg | ORAL_CAPSULE | Freq: Two times a day (BID) | ORAL | 0 refills | Status: DC

## 2020-06-14 NOTE — Progress Notes (Signed)

## 2020-07-07 ENCOUNTER — Telehealth: Payer: Medicaid Other | Admitting: Family

## 2020-07-07 DIAGNOSIS — R399 Unspecified symptoms and signs involving the genitourinary system: Secondary | ICD-10-CM

## 2020-07-07 NOTE — Progress Notes (Signed)
Based on what you shared with me, I feel your condition warrants further evaluation and I recommend that you be seen for a face to face office visit.   Given that your symptoms I returned after just being treated a few weeks ago I need to be seen face-to-face for urine culture   NOTE: If you entered your credit card information for this eVisit, you will not be charged. You may see a "hold" on your card for the $35 but that hold will drop off and you will not have a charge processed.   If you are having a true medical emergency please call 911.      For an urgent face to face visit, [facility] has five urgent care centers for your convenience:     [facility] at [facility] Directions [phone] [address] [place], [place] [address] . 10 am - 6pm Monday - Friday    [facility]) Get Driving Directions [phone] [address] [place], [place] [address] . 10 am to 8 pm Monday-Friday . 12 pm to 8 pm [facility] Urgent Care at [facility] Get Driving Directions [phone] [address] [place], [place] [address] . 8 am to 8 pm Monday-Friday . 9 am to 6 pm Saturday . 11 am to 6 pm Sunday     [facility] Urgent Care at [facility] Get Driving Directions  [phone] [address] [place], [place] [address] . 8 am to 8 pm Monday-Friday . 8 am to 4 pm [facility] Urgent Care at [facility] Directions [phone] [address] [place], [place] [address] . 12 pm to 6 pm Monday-Friday      Your e-visit answers were reviewed by a board certified advanced clinical practitioner to complete your personal care plan.  Thank you for using e-Visits.

## 2020-07-08 ENCOUNTER — Ambulatory Visit
Admission: EM | Admit: 2020-07-08 | Discharge: 2020-07-08 | Disposition: A | Discharge status: Home / Self Care | Payer: Medicaid Other | Attending: Emergency Medicine | Admitting: Emergency Medicine

## 2020-07-08 ENCOUNTER — Other Ambulatory Visit: Payer: Self-pay

## 2020-07-08 ENCOUNTER — Encounter: Payer: Self-pay | Admitting: Emergency Medicine

## 2020-07-08 DIAGNOSIS — R030 Elevated blood-pressure reading, without diagnosis of hypertension: Secondary | ICD-10-CM | POA: Insufficient documentation

## 2020-07-08 DIAGNOSIS — N390 Urinary tract infection, site not specified: Secondary | ICD-10-CM | POA: Insufficient documentation

## 2020-07-08 LAB — URINALYSIS, COMPLETE (UACMP) WITH MICROSCOPIC
Bilirubin Urine: NEGATIVE
Glucose, UA: NEGATIVE mg/dL
Ketones, ur: NEGATIVE mg/dL
Nitrite: POSITIVE — AB
Protein, ur: 100 mg/dL — AB
Specific Gravity, Urine: 1.02 (ref 1.005–1.030)
pH: 7 (ref 5.0–8.0)

## 2020-07-08 MED ORDER — PHENAZOPYRIDINE HCL 200 MG PO TABS: 200.0000 mg | ORAL_TABLET | Freq: Three times a day (TID) | ORAL | 0 refills | Status: DC

## 2020-07-08 MED ORDER — CEPHALEXIN 500 MG PO CAPS: 500.0000 mg | ORAL_CAPSULE | Freq: Two times a day (BID) | ORAL | 0 refills | Status: AC

## 2020-07-08 NOTE — Discharge Instructions (Addendum)
Take the Keflex twice daily for 5 days for your urinary tract infection.  Take it with food.  Use the Pyridium every 8 hours as needed for urinary discomfort.  This will turn your urine bright orange.  Increase your oral fluid intake so as to increase urine production and help flush your urinary tract.  Get a blood pressure cuff and record your blood pressure daily.  Recorded either midmorning or midafternoon.  When you take it, sit with your feet flat on the floor and your arm at heart level for period of 5 minutes before you take the result.  Record the result to discuss with your primary care provider.

## 2020-07-08 NOTE — ED Triage Notes (Signed)
Patient c/o dysuria and urinary frequency that started yesterday.  

## 2020-07-08 NOTE — ED Provider Notes (Signed)
[facility]    CSN: [id number] Arrival date & time: 07/08/20  1712      History   Chief Complaint Chief Complaint  Patient presents with  . Dysuria  . Urinary Frequency         HPI Maureen Hoffman is a 34 y.o. female.   HPI   34 year old female here for evaluation of painful urination and urinary frequency that started yesterday.  Patient reports that she seen some blood in her urine, she has had some low back pain, some cloudiness in her urine with associated nausea and dizziness.  Patient denies fever or abdominal pain.  Patient's blood pressure is very high at 165/110 here in clinic.  Patient states that she does not have a history of high blood pressure but she has been going through a lot.  Her uncle just died on her birthday and her father just got diagnosed with stage II colon cancer.    Past Medical History:  Diagnosis Date  . Seizures (HCC)    as a child  . Thrombocytopenia affecting pregnancy, antepartum (HCC) 12/03/2016  . Thrombocytopenia complicating pregnancy (HCC)    2018    Patient Active Problem List   Diagnosis Date Noted  . Anxiety and depression 11/13/2019  . Hair loss 11/13/2019    Past Surgical History:  Procedure Laterality Date  . NO PAST SURGERIES      OB History    Gravida  3   Para  1   Term  1   Preterm      AB  1   Living  2     SAB  1   IAB      Ectopic      Multiple  0   Live Births  1            Home Medications    Prior to Admission medications   Medication Sig Start Date End Date Taking? Authorizing Provider  norgestimate-ethinyl estradiol (ORTHO-CYCLEN) 0.25-35 MG-MCG tablet Take 1 tablet by mouth daily. 11/13/19  Yes Cathlean Marseilles A, NP  sertraline (ZOLOFT) 100 MG tablet Take 1 tablet (100 mg total) by mouth daily. 03/24/20  Yes Cannady, Jolene T, NP  Biotin 1000 MCG CHEW Chew 1,000 mcg by mouth daily.    [provider]  cephALEXin (KEFLEX) 500 MG capsule Take 1 capsule  (500 mg total) by mouth 2 (two) times daily for 5 days. 07/08/20 07/13/20  Becky Augusta, NP  fluticasone (FLONASE) 50 MCG/ACT nasal spray Place 2 sprays into both nostrils daily. 01/22/20   Valentino Nose, NP  metroNIDAZOLE (METROGEL VAGINAL) 0.75 % vaginal gel Place 1 Applicatorful vaginally at bedtime. 02/22/20   Valentino Nose, NP  metroNIDAZOLE (METROGEL) 1 % gel Apply topically daily. 01/29/20   Valentino Nose, NP  Omega-3 Fatty Acids (FISH OIL) 1000 MG CAPS Take 1,000 mg by mouth daily.    [provider]  phenazopyridine (PYRIDIUM) 200 MG tablet Take 1 tablet (200 mg total) by mouth 3 (three) times daily. 07/08/20   Becky Augusta, NP  vitamin C (ASCORBIC ACID) 250 MG tablet Take 250 mg by mouth daily.    [provider]    Family History Family History  Problem Relation Age of Onset  . Healthy Mother   . Healthy Father   . Hashimoto's thyroiditis Sister   . Breast cancer Maternal Grandmother   . Diabetes Maternal Grandmother   . Breast cancer Paternal Grandmother   . COPD Paternal  Grandmother   . Brain cancer Paternal Grandfather     Social History Social History   Tobacco Use  . Smoking status: Former Smoker    Types: Cigarettes    Quit date: 07/13/2010    Years since quitting: 9.9  . Smokeless tobacco: Never Used  Vaping Use  . Vaping Use: Never used  Substance Use Topics  . Alcohol use: No  . Drug use: No     Allergies   Eggs or egg-derived products   Review of Systems Review of Systems  Constitutional: Negative for activity change, appetite change and fever.  Gastrointestinal: Positive for nausea. Negative for abdominal pain, diarrhea and vomiting.  Genitourinary: Positive for dysuria, frequency, hematuria and urgency.  Musculoskeletal: Positive for back pain.  Skin: Negative for rash.  Neurological: Positive for dizziness. Negative for syncope, facial asymmetry and headaches.  Hematological: Negative.    Psychiatric/Behavioral: Negative.      Physical Exam Triage Vital Signs ED Triage Vitals  Enc Vitals Group     BP 07/08/20 1749 (!) 165/110     Pulse Rate 07/08/20 1749 86     Resp 07/08/20 1749 18     Temp 07/08/20 1749 98 F (36.7 C)     Temp Source 07/08/20 1749 Oral     SpO2 07/08/20 1749 100 %     Weight 07/08/20 1748 180 lb (81.6 kg)     Height 07/08/20 1748 5\' 3"  (1.6 m)     Head Circumference --      Peak Flow --      Pain Score --      Pain Loc --      Pain Edu? --      Excl. in GC? --    No data found.  Updated Vital Signs BP (!) 142/92   Pulse 78   Temp 98 F (36.7 C) (Oral)   Resp 18   Ht 5\' 3"  (1.6 m)   Wt 180 lb (81.6 kg)   SpO2 100%   BMI 31.89 kg/m   Visual Acuity Right Eye Distance:   Left Eye Distance:   Bilateral Distance:    Right Eye Near:   Left Eye Near:    Bilateral Near:     Physical Exam Vitals and nursing note reviewed.  Constitutional:      General: She is not in acute distress.    Appearance: Normal appearance.  HENT:     Head: Normocephalic and atraumatic.  Eyes:     General: No scleral icterus.    Extraocular Movements: Extraocular movements intact.     Conjunctiva/sclera: Conjunctivae normal.     Pupils: Pupils are equal, round, and reactive to light.  Cardiovascular:     Rate and Rhythm: Normal rate and regular rhythm.     Pulses: Normal pulses.     Heart sounds: Normal heart sounds. No murmur heard.   Pulmonary:     Effort: Pulmonary effort is normal.     Breath sounds: Normal breath sounds. No wheezing, rhonchi or rales.  Abdominal:     Tenderness: There is no right CVA tenderness or left CVA tenderness.  Musculoskeletal:        General: No swelling. Normal range of motion.  Skin:    General: Skin is warm and dry.     Capillary Refill: Capillary refill takes less than 2 seconds.     Findings: No erythema.  Neurological:     General: No focal deficit present.     Mental Status: She  is alert and oriented  to person, place, and time.  Psychiatric:        Mood and Affect: Mood normal.        Behavior: Behavior normal.        Thought Content: Thought content normal.        Judgment: Judgment normal.      UC Treatments / Results  Labs (all labs ordered are listed, but only abnormal results are displayed) Labs Reviewed  URINALYSIS, COMPLETE (UACMP) WITH MICROSCOPIC - Abnormal; Notable for the following components:      Result Value   APPearance HAZY (*)    Hgb urine dipstick SMALL (*)    Protein, ur 100 (*)    Nitrite POSITIVE (*)    Leukocytes,Ua SMALL (*)    Bacteria, UA MANY (*)    All other components within normal limits  URINE CULTURE    EKG   Radiology No results found.  Procedures Procedures (including critical care time)  Medications Ordered in UC Medications - No data to display  Initial Impression / Assessment and Plan / UC Course  I have reviewed the triage vital signs and the nursing notes.  Pertinent labs & imaging results that were available during my care of the patient were reviewed by me and considered in my medical decision making (see chart for details).   Patient is here for evaluation of UTI symptoms that began yesterday.  Patient is also been experiencing some nausea and dizziness.  Her blood pressure is elevated at 165/110.  Patient has no history of hypertension.  Physical exam reveals normal heart sounds and lungs are clear to auscultation all fields.  Abdomen is benign and patient has no CVA tenderness.  Neurologic exam grossly intact.  Patient has no history of high blood pressure last 2 clinic visits blood pressure is within normal range.  12/11/2019 blood pressure 126/84, 11/13/2019 blood pressure 114/73.  Patient does admit that she is under a lot of stress and has had a lot of life-changing events here in the recent past.  Patient denies headache or visual changes.  Urinalysis shows small amount of blood, 100 protein, nitrite positive, small  leukocytes, WBCs 21-50, many bacteria.  Will send urine for culture.  Will treat with Keflex twice daily x5 days and give Pyridium for urinary discomfort.  We will have patient sit with her feet flat on the floor and in a relaxed position for 5 minutes and then will recheck blood pressure.  If blood pressure remains at a current level will send to ER for hypertensive urgency.  If blood pressure comes down to a normal level will DC patient home to follow-up with her PCP.  Repeat blood pressure is 142/92.   Final Clinical Impressions(s) / UC Diagnoses   Final diagnoses:  Lower urinary tract infectious disease  Elevated blood pressure reading     Discharge Instructions     Take the Keflex twice daily for 5 days for your urinary tract infection.  Take it with food.  Use the Pyridium every 8 hours as needed for urinary discomfort.  This will turn your urine bright orange.  Increase your oral fluid intake so as to increase urine production and help flush your urinary tract.  Get a blood pressure cuff and record your blood pressure daily.  Recorded either midmorning or midafternoon.  When you take it, sit with your feet flat on the floor and your arm at heart level for period of 5 minutes before you take  the result.  Record the result to discuss with your primary care provider.      ED Prescriptions    Medication Sig Dispense Auth. Provider   cephALEXin (KEFLEX) 500 MG capsule Take 1 capsule (500 mg total) by mouth 2 (two) times daily for 5 days. 10 capsule Becky Augusta, NP   phenazopyridine (PYRIDIUM) 200 MG tablet Take 1 tablet (200 mg total) by mouth 3 (three) times daily. 6 tablet Becky Augusta, NP     PDMP not reviewed this encounter.   Becky Augusta, NP 07/08/20 1824

## 2020-07-10 ENCOUNTER — Ambulatory Visit: Payer: Medicaid Other | Admitting: Family Medicine

## 2020-07-10 LAB — URINE CULTURE
Culture: >=100000 — AB
Special Requests: NORMAL

## 2020-07-10 NOTE — Progress Notes (Deleted)
    SUBJECTIVE:   CHIEF COMPLAINT / HPI:   Past Medical History:  Diagnosis Date  . Seizures (HCC)    as a child  . Thrombocytopenia affecting pregnancy, antepartum (HCC) 12/03/2016  . Thrombocytopenia complicating pregnancy (HCC)    2018     Blood pressure concerns: - Seen in UC 12/14 with UTI, BP 165/110 at that time, 142/92 on recheck.  - no prior h/o high BP. Kidneys with normal appearance on CT 07/2018. - current stressors include recent death of uncle on her birthday and father recently diagnosed with stage 2 colon cancer. - Medications: *** - Compliance: *** - Checking BP at home: *** - Denies any SOB, CP, vision changes, LE edema, medication SEs, or symptoms of hypotension - Diet: *** - Exercise: ***  Hypothyroidism - previous TSH 4.8 01/22/20 - Medications: Synthroid *** - Current symptoms:  {$Symptoms; thyroid:[phone]} - Denies {$Symptoms; thyroid:[phone]} - Symptoms have {$Desc; symptom [phone]}    OBJECTIVE:   There were no vitals taken for this visit.  ***  ASSESSMENT/PLAN:   No problem-specific Assessment & Plan notes found for this encounter.     Caro Laroche, DO [facility]

## 2020-07-24 ENCOUNTER — Encounter: Payer: Self-pay | Admitting: Nurse Practitioner

## 2020-09-02 ENCOUNTER — Other Ambulatory Visit: Payer: Self-pay

## 2020-09-02 ENCOUNTER — Encounter: Payer: Self-pay | Admitting: Family Medicine

## 2020-09-02 ENCOUNTER — Ambulatory Visit: Payer: Medicaid Other | Admitting: Family Medicine

## 2020-09-02 VITALS — BP 134/88 | HR 76 | Temp 98.9°F | Ht 63.0 in | Wt 185.2 lb

## 2020-09-02 DIAGNOSIS — R5383 Other fatigue: Secondary | ICD-10-CM | POA: Diagnosis not present

## 2020-09-02 DIAGNOSIS — F419 Anxiety disorder, unspecified: Secondary | ICD-10-CM

## 2020-09-02 DIAGNOSIS — R030 Elevated blood-pressure reading, without diagnosis of hypertension: Secondary | ICD-10-CM

## 2020-09-02 DIAGNOSIS — I10 Essential (primary) hypertension: Secondary | ICD-10-CM | POA: Insufficient documentation

## 2020-09-02 DIAGNOSIS — E079 Disorder of thyroid, unspecified: Secondary | ICD-10-CM | POA: Insufficient documentation

## 2020-09-02 DIAGNOSIS — R7989 Other specified abnormal findings of blood chemistry: Secondary | ICD-10-CM

## 2020-09-02 DIAGNOSIS — F32A Depression, unspecified: Secondary | ICD-10-CM | POA: Diagnosis not present

## 2020-09-02 MED ORDER — BUSPIRONE HCL 7.5 MG PO TABS: 7.5000 mg | ORAL_TABLET | Freq: Two times a day (BID) | ORAL | 0 refills | Status: DC | PRN

## 2020-09-02 NOTE — Assessment & Plan Note (Addendum)
Noted at UC, slightly elevated today to SBP 130s but also slightly anxious. Will aim at treating anxiety and recheck thyroid levels and BMP. Plan to recheck in 4 weeks.

## 2020-09-02 NOTE — Assessment & Plan Note (Signed)
Not well controlled, exacerbated by father's recent cancer diagnosis. Declined counseling. Will trial prn buspar. Continue sertraline. Coping mechanisms discussed. F/u in 4 weeks.

## 2020-09-02 NOTE — Progress Notes (Signed)
   SUBJECTIVE:   CHIEF COMPLAINT / HPI:   Patient Active Problem List   Diagnosis Date Noted  . Elevated TSH 09/02/2020  . Elevated BP without diagnosis of hypertension 09/02/2020  . Anxiety and depression 11/13/2019  . Hair loss 11/13/2019   UTI - previously seen 07/08/20 at [facility] for dysuria. Rx keflex x5 days and pyridium. Culture with >100k ecoli, sensitive to cefazolin. - symptoms resolved s/p antibiotics  Thyroid concerns - previously slightly elevated TSH, lost to follow up - sister with Hashimoto's. Dad and grandmother with hypothyroidism. - normal periods - Current symptoms: excessive fatigue, sweating more, hair loss, difficulty losing weight, jitteriness  - Denies diarrhea  Elevated BP: - noted at [facility] 06/2020, 165/110 with improvement to 142/92 - Medications: none - Compliance: n/a - Checking BP at home: yes, 130s SBP, higher with stress - FH: mom with HTN - Denies any SOB, CP, vision changes, LE edema, or symptoms of hypotension  Anxiety - Medications: sertraline 100mg  daily - Taking: good compliance - Counseling: no - Symptoms: panic attacks sometimes (chest tightness) 2-3x per week, difficulties sleeping - Current stressors: father has stage 3 colon cancer, 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 - Coping Mechanisms: distraction, laying down, deep breaths  GAD 7 : Generalized Anxiety Score 09/02/2020 01/22/2020 12/11/2019 11/13/2019  Nervous, Anxious, on Edge 3 1 1  0  Control/stop worrying 2 0 1 3  Worry too much - different things 3 1 1 3   Trouble relaxing 3 0 1 1  Restless 2 0 0 0  Easily annoyed or irritable 1 1 1 3   Afraid - awful might happen 0 0 0 0  Total GAD 7 Score 14 3 5 10   Anxiety Difficulty - Somewhat difficult Somewhat difficult Somewhat difficult     OBJECTIVE:   BP 134/88   Pulse 76   Temp 98.9 F (37.2 C) (Oral)   Ht 5\' 3"  (1.6 m)   Wt 185 lb 3.2 oz (84 kg)   SpO2 99%   BMI 32.81 kg/m   Gen: well appearing, in NAD HEENT: no thyromegaly or nodules  appreciated. Card: RRR Lungs: CTAB Ext: WWP, no edema Psych: slightly anxious appearing, appropriate affect  ASSESSMENT/PLAN:   Anxiety and depression Not well controlled, exacerbated by father's recent cancer diagnosis. Declined counseling. Will trial prn buspar. Continue sertraline. Coping mechanisms discussed. F/u in 4 weeks.  Elevated TSH Noted previously with +FH. Will recheck levels today and treat as indicated.  Elevated BP without diagnosis of hypertension Noted at UC, slightly elevated today to SBP 130s but also slightly anxious. Will aim at treating anxiety and recheck thyroid levels and BMP. Plan to recheck in 4 weeks.     11/15/2019, DO

## 2020-09-02 NOTE — Assessment & Plan Note (Signed)
Noted previously with +FH. Will recheck levels today and treat as indicated.

## 2020-09-02 NOTE — Patient Instructions (Signed)
It was great to see you!  Our plans for today:  - Try the buspirone for when you are feeling worked up.  - See below for apps that you can download on your phone that can help with feelings of anxiousness or worrying. - We are checking some labs today, we will release these results to your MyChart. - Follow up in 4 weeks.   Take care and seek immediate care sooner if you develop any concerns.   Dr. Linwood Dibbles   Mental Health Apps and Websites Here are a few free apps meant to help you to help yourself.  To find, try searching on the internet to see if the app is offered on Apple/Android devices. If your first choice doesn't come up on your device, the good news is that there are many choices! Play around with different apps to see which ones are helpful to you . Calm This is an app meant to help increase calm feelings. Includes info, strategies, and tools for tracking your feelings.   Calm Harm  This app is meant to help with self-harm. Provides many 5-minute or 15-min coping strategies for doing instead of hurting yourself.    Healthy Minds Health Minds is a problem-solving tool to help deal with emotions and cope with stress you encounter wherever you are.    MindShift This app can help people cope with anxiety. Rather than trying to avoid anxiety, you can make an important shift and face it.    MY3  MY3 features a support system, safety plan and resources with the goal of offering a tool to use in a time of need.    My Life My Voice  This mood journal offers a simple solution for tracking your thoughts, feelings and moods. Animated emoticons can help identify your mood.   Relax Melodies Designed to help with sleep, on this app you can mix sounds and meditations for relaxation.    Smiling Mind Smiling Mind is meditation made easy: it's a simple tool that helps put a smile on your mind.    Stop, Breathe & Think  A friendly, simple guide for people through meditations for  mindfulness and compassion.  Stop, Breathe and Think Kids Enter your current feelings and choose a "mission" to help you cope. Offers videos for certain moods instead of just sound recordings.     The [employer] The [employer] (VHB) contains simple tools to help patients with coping, relaxation, distraction, and positive thinking.

## 2020-09-02 NOTE — Assessment & Plan Note (Signed)
>>  ASSESSMENT AND PLAN FOR ELEVATED TSH WRITTEN ON 09/02/2020  5:17 PM BY RUMBALL, ALISON M, DO  Noted previously with +FH. Will recheck levels today and treat as indicated.

## 2020-09-03 LAB — BASIC METABOLIC PANEL
BUN/Creatinine Ratio: 20 (ref 9–23)
BUN: 12 mg/dL (ref 6–20)
CO2: 21 mmol/L (ref 20–29)
Calcium: 9.4 mg/dL (ref 8.7–10.2)
Chloride: 98 mmol/L (ref 96–106)
Creatinine, Ser: 0.61 mg/dL (ref 0.57–1.00)
GFR calc Af Amer: 136 mL/min/{1.73_m2} (ref >59)
GFR calc non Af Amer: 118 mL/min/{1.73_m2} (ref >59)
Glucose: 83 mg/dL (ref 65–99)
Potassium: 4.1 mmol/L (ref 3.5–5.2)
Sodium: 138 mmol/L (ref 134–144)

## 2020-09-03 LAB — CBC
Hematocrit: 37.1 % (ref 34.0–46.6)
Hemoglobin: 12.9 g/dL (ref 11.1–15.9)
MCH: 29.5 pg (ref 26.6–33.0)
MCHC: 34.8 g/dL (ref 31.5–35.7)
MCV: 85 fL (ref 79–97)
Platelets: 212 10*3/uL (ref 150–450)
RBC: 4.38 x10E6/uL (ref 3.77–5.28)
RDW: 12.5 % (ref 11.7–15.4)
WBC: 6.6 10*3/uL (ref 3.4–10.8)

## 2020-09-03 LAB — THYROID PANEL WITH TSH
Free Thyroxine Index: 1 — ABNORMAL LOW (ref 1.2–4.9)
T3 Uptake Ratio: 14 % — ABNORMAL LOW (ref 24–39)
T4, Total: 7.2 ug/dL (ref 4.5–12.0)
TSH: 6.42 u[IU]/mL — ABNORMAL HIGH (ref 0.450–4.500)

## 2020-09-03 MED ORDER — LEVOTHYROXINE SODIUM 125 MCG PO TABS: 125.0000 ug | ORAL_TABLET | Freq: Every day | ORAL | 0 refills | Status: DC

## 2020-09-03 NOTE — Addendum Note (Signed)
Addended by: Caro Laroche on: 09/03/2020 10:53 AM   Modules accepted: Orders

## 2020-09-18 ENCOUNTER — Other Ambulatory Visit: Payer: Self-pay

## 2020-09-18 MED ORDER — FLUTICASONE PROPIONATE 50 MCG/ACT NA SUSP: 2.0000 | Freq: Every day | NASAL | 6 refills | Status: DC

## 2020-09-25 ENCOUNTER — Other Ambulatory Visit: Payer: Self-pay | Admitting: Family Medicine

## 2020-09-25 NOTE — Telephone Encounter (Signed)
Requested medication (s) are due for refill today: no  Requested medication (s) are on the active medication list: yes  Last refill:  09/02/20 #60 0 refills  Future visit scheduled: no  Notes to clinic:  request for 90 RX     Requested Prescriptions  Pending Prescriptions Disp Refills   busPIRone (BUSPAR) 7.5 MG tablet [Pharmacy Med Name: BUSPIRONE HCL 7.5 MG TABLET] 180 tablet 1    Sig: Take 1 tablet (7.5 mg total) by mouth 2 (two) times daily as needed (anxiety, nervousness).      Psychiatry: Anxiolytics/Hypnotics - Non-controlled Passed - 09/25/2020  1:33 PM      Passed - Valid encounter within last 6 months    Recent Outpatient Visits           3 weeks ago Fatigue, unspecified type   [facility] Caro Laroche, DO   8 months ago Annual physical exam   [facility] Valentino Nose, NP   9 months ago GAD (generalized anxiety disorder)   [facility] Valentino Nose, NP   10 months ago Mood changes   [facility] Valentino Nose, NP

## 2020-10-06 ENCOUNTER — Other Ambulatory Visit: Payer: Self-pay

## 2020-10-06 MED ORDER — NORGESTIMATE-ETH ESTRADIOL 0.25-35 MG-MCG PO TABS: 1.0000 | ORAL_TABLET | Freq: Every day | ORAL | 3 refills | Status: DC

## 2020-10-06 NOTE — Telephone Encounter (Signed)
Refill request received for Norg-Ethin=Estra 0.25-0.35mg   QTY 84 3 RF

## 2020-10-09 ENCOUNTER — Other Ambulatory Visit: Payer: Self-pay | Admitting: Family Medicine

## 2020-10-09 NOTE — Telephone Encounter (Signed)
Needs appointment for future refills.

## 2020-10-09 NOTE — Telephone Encounter (Signed)
Patient is overdue for follow up.  Please schedule

## 2020-10-09 NOTE — Telephone Encounter (Signed)
Pt stated she will call to make this apt/

## 2020-10-09 NOTE — Telephone Encounter (Signed)
Requested medication (s) are due for refill today: yes  Requested medication (s) are on the active medication list: yes  Last refill:  09/02/2020  Future visit scheduled: no  Notes to clinic:  Patient was due to follow up on 09/30/2020 Review for refill    Requested Prescriptions  Pending Prescriptions Disp Refills   busPIRone (BUSPAR) 7.5 MG tablet [Pharmacy Med Name: BUSPIRONE HCL 7.5 MG TABLET] 60 tablet 0    Sig: Take 1 tablet (7.5 mg total) by mouth 2 (two) times daily as needed (anxiety, nervousness).      Psychiatry: Anxiolytics/Hypnotics - Non-controlled Passed - 10/09/2020 12:33 PM      Passed - Valid encounter within last 6 months    Recent Outpatient Visits           1 month ago Fatigue, unspecified type   [facility] Caro Laroche, DO   8 months ago Annual physical exam   [facility] Valentino Nose, NP   10 months ago GAD (generalized anxiety disorder)   [facility] Valentino Nose, NP   11 months ago Mood changes   [facility] Valentino Nose, NP       Future Appointments             In 3 months McElwee, Jake Church, NP [facility]

## 2020-11-03 ENCOUNTER — Other Ambulatory Visit: Payer: Self-pay | Admitting: Nurse Practitioner

## 2020-11-03 NOTE — Telephone Encounter (Signed)
Called pt to schedule appt was suppose to follow up (around 09/30/2020) for anxiety, BP, thyroid. No answer vm not set up

## 2020-11-03 NOTE — Telephone Encounter (Signed)
   Notes to clinic: Patient is requesting a 90 day supply    Requested Prescriptions  Pending Prescriptions Disp Refills   busPIRone (BUSPAR) 7.5 MG tablet [Pharmacy Med Name: BUSPIRONE HCL 7.5 MG TABLET] 180 tablet 1    Sig: TAKE 1 TABLET (7.5 MG TOTAL) BY MOUTH 2 (TWO) TIMES DAILY AS NEEDED (ANXIETY, NERVOUSNESS).      Psychiatry: Anxiolytics/Hypnotics - Non-controlled Passed - 11/03/2020  1:31 PM      Passed - Valid encounter within last 6 months    Recent Outpatient Visits           2 months ago Fatigue, unspecified type   [facility] Caro Laroche, DO   9 months ago Annual physical exam   [facility] Valentino Nose, NP   10 months ago GAD (generalized anxiety disorder)   [facility] Valentino Nose, NP   11 months ago Mood changes   [facility] Valentino Nose, NP       Future Appointments             In 2 months McElwee, Jake Church, NP [facility]

## 2020-11-05 NOTE — Telephone Encounter (Signed)
Called pt she states that she doesn't take this medication anymore

## 2020-11-07 ENCOUNTER — Other Ambulatory Visit: Payer: Self-pay

## 2020-11-07 ENCOUNTER — Ambulatory Visit
Admission: EM | Admit: 2020-11-07 | Discharge: 2020-11-07 | Disposition: A | Discharge status: Home / Self Care | Payer: Medicaid Other | Attending: Sports Medicine | Admitting: Sports Medicine

## 2020-11-07 ENCOUNTER — Encounter: Payer: Self-pay | Admitting: Emergency Medicine

## 2020-11-07 DIAGNOSIS — J069 Acute upper respiratory infection, unspecified: Secondary | ICD-10-CM | POA: Diagnosis not present

## 2020-11-07 DIAGNOSIS — J029 Acute pharyngitis, unspecified: Secondary | ICD-10-CM | POA: Insufficient documentation

## 2020-11-07 DIAGNOSIS — Z79899 Other long term (current) drug therapy: Secondary | ICD-10-CM | POA: Insufficient documentation

## 2020-11-07 DIAGNOSIS — H9202 Otalgia, left ear: Secondary | ICD-10-CM | POA: Insufficient documentation

## 2020-11-07 DIAGNOSIS — Z87891 Personal history of nicotine dependence: Secondary | ICD-10-CM | POA: Diagnosis not present

## 2020-11-07 DIAGNOSIS — Z20822 Contact with and (suspected) exposure to covid-19: Secondary | ICD-10-CM | POA: Insufficient documentation

## 2020-11-07 HISTORY — DX: Disorder of thyroid, unspecified: E07.9

## 2020-11-07 HISTORY — DX: Emotional lability: R45.86

## 2020-11-07 LAB — SARS CORONAVIRUS 2 (TAT 6-24 HRS): SARS Coronavirus 2: NEGATIVE

## 2020-11-07 LAB — GROUP A STREP BY PCR: Group A Strep by PCR: NOT DETECTED

## 2020-11-07 MED ORDER — IPRATROPIUM BROMIDE 0.06 % NA SOLN: 2.0000 | Freq: Four times a day (QID) | NASAL | 12 refills | Status: DC

## 2020-11-07 MED ORDER — PROMETHAZINE-DM 6.25-15 MG/5ML PO SYRP: 5.0000 mL | ORAL_SOLUTION | Freq: Four times a day (QID) | ORAL | 0 refills | Status: DC | PRN

## 2020-11-07 NOTE — Discharge Instructions (Signed)
Strep test is negative.  URI/COLD SYMPTOMS: Your exam today is consistent with a viral illness. Antibiotics are not indicated at this time. Use medications as directed, including cough syrup, nasal saline, and decongestants. Your symptoms should improve over the next few days and resolve within 7-10 days. Increase rest and fluids. F/u if symptoms worsen or predominate such as sore throat, ear pain, productive cough, shortness of breath, or if you develop high fevers or worsening fatigue over the next several days.    You have received COVID testing today either for positive exposure, concerning symptoms that could be related to COVID infection, screening purposes, or re-testing after confirmed positive.  Your test obtained today checks for active viral infection in the last 1-2 weeks. If your test is negative now, you can still test positive later. So, if you do develop symptoms you should either get re-tested and/or isolate x 5 days and then strict mask use x 5 days (unvaccinated) or mask use x 10 days (vaccinated). Please follow CDC guidelines.  While Rapid antigen tests come back in 15-20 minutes, send out PCR/molecular test results typically come back within 1-3 days. In the mean time, if you are symptomatic, assume this could be a positive test and treat/monitor yourself as if you do have COVID.   We will call with test results if positive. Please download the MyChart app and set up a profile to access test results.   If symptomatic, go home and rest. Push fluids. Take Tylenol as needed for discomfort. Gargle warm salt water. Throat lozenges. Take Mucinex DM or Robitussin for cough. Humidifier in bedroom to ease coughing. Warm showers. Also review the COVID handout for more information.  COVID-19 INFECTION: The incubation period of COVID-19 is approximately 14 days after exposure, with most symptoms developing in roughly 4-5 days. Symptoms may range in severity from mild to critically severe.  Roughly 80% of those infected will have mild symptoms. People of any age may become infected with COVID-19 and have the ability to transmit the virus. The most common symptoms include: fever, fatigue, cough, body aches, headaches, sore throat, nasal congestion, shortness of breath, nausea, vomiting, diarrhea, changes in smell and/or taste.    COURSE OF ILLNESS Some patients may begin with mild disease which can progress quickly into critical symptoms. If your symptoms are worsening please call ahead to the Emergency Department and proceed there for further treatment. Recovery time appears to be roughly 1-2 weeks for mild symptoms and 3-6 weeks for severe disease.   GO IMMEDIATELY TO ER FOR FEVER YOU ARE UNABLE TO GET DOWN WITH TYLENOL, BREATHING PROBLEMS, CHEST PAIN, FATIGUE, LETHARGY, INABILITY TO EAT OR DRINK, ETC  QUARANTINE AND ISOLATION: To help decrease the spread of COVID-19 please remain isolated if you have COVID infection or are highly suspected to have COVID infection. This means -stay home and isolate to one room in the home if you live with others. Do not share a bed or bathroom with others while ill, sanitize and wipe down all countertops and keep common areas clean and disinfected. Stay home for 5 days. If you have no symptoms or your symptoms are resolving after 5 days, you can leave your house. Continue to wear a mask around others for 5 additional days. If you have been in close contact (within 6 feet) of someone diagnosed with COVID 19, you are advised to quarantine in your home for 14 days as symptoms can develop anywhere from 2-14 days after exposure to the virus. If  you develop symptoms, you  must isolate.  Most current guidelines for COVID after exposure -unvaccinated: isolate 5 days and strict mask use x 5 days. Test on day 5 is possible -vaccinated: wear mask x 10 days if symptoms do not develop -You do not necessarily need to be tested for COVID if you have + exposure and   develop symptoms. Just isolate at home x10 days from symptom onset During this global pandemic, CDC advises to practice social distancing, try to stay at least 73ft away from others at all times. Wear a face covering. Wash and sanitize your hands regularly and avoid going anywhere that is not necessary.  KEEP IN MIND THAT THE COVID TEST IS NOT 100% ACCURATE AND YOU SHOULD STILL DO EVERYTHING TO PREVENT POTENTIAL SPREAD OF VIRUS TO OTHERS (WEAR MASK, WEAR GLOVES, [place] HANDS AND SANITIZE REGULARLY). IF INITIAL TEST IS NEGATIVE, THIS MAY NOT MEAN YOU ARE DEFINITELY NEGATIVE. MOST ACCURATE TESTING IS DONE 5-7 DAYS AFTER EXPOSURE.   It is not advised by CDC to get re-tested after receiving a positive COVID test since you can still test positive for weeks to months after you have already cleared the virus.   *If you have not been vaccinated for COVID, I strongly suggest you consider getting vaccinated as long as there are no contraindications.

## 2020-11-07 NOTE — ED Provider Notes (Signed)
[facility]    CSN: [id number] Arrival date & time: 11/07/20  1230      History   Chief Complaint Chief Complaint  Patient presents with  . Nasal Congestion  . Sore Throat  . Otalgia    left    HPI Maureen Hoffman is a 35 y.o. female presenting for 4-day history of nasal congestion with yellow and bloody drainage.  Also admits to sore throat and ear pain on the left side that started yesterday.  She denies any fever or fatigue.  No headaches or sinus pain.  Denies much of a cough.  No breathing difficulty, nausea or vomiting.  Patient states she is tried DayQuil, Mucinex and Sudafed as well as used Flonase without improvement in her symptoms.  She believes she may have a sinus infection.  No sick contacts and no known exposure to COVID-19.  Has had one Covid vaccine.  No other concerns.  HPI  Past Medical History:  Diagnosis Date  . Mood swings   . Seizures (HCC)    as a child  . Thrombocytopenia affecting pregnancy, antepartum (HCC) 12/03/2016  . Thrombocytopenia complicating pregnancy (HCC)    2018  . Thyroid disease     Patient Active Problem List   Diagnosis Date Noted  . Elevated TSH 09/02/2020  . Elevated BP without diagnosis of hypertension 09/02/2020  . Anxiety and depression 11/13/2019  . Hair loss 11/13/2019    Past Surgical History:  Procedure Laterality Date  . NO PAST SURGERIES      OB History    Gravida  3   Para  1   Term  1   Preterm      AB  1   Living  2     SAB  1   IAB      Ectopic      Multiple  0   Live Births  1            Home Medications    Prior to Admission medications   Medication Sig Start Date End Date Taking? Authorizing Provider  busPIRone (BUSPAR) 7.5 MG tablet TAKE 1 TABLET (7.5 MG TOTAL) BY MOUTH 2 (TWO) TIMES DAILY AS NEEDED (ANXIETY, NERVOUSNESS). 11/05/20  Yes McElwee, Lauren A, NP  fluticasone (FLONASE) 50 MCG/ACT nasal spray Place 2 sprays into both nostrils daily. 09/18/20  Yes  Cannady, Jolene T, NP  ipratropium (ATROVENT) 0.06 % nasal spray Place 2 sprays into both nostrils 4 (four) times daily. 11/07/20  Yes Shirlee Latch, PA-C  levothyroxine (SYNTHROID) 125 MCG tablet Take 1 tablet (125 mcg total) by mouth daily. 09/03/20  Yes Caro Laroche, DO  norgestimate-ethinyl estradiol (ORTHO-CYCLEN) 0.25-35 MG-MCG tablet Take 1 tablet by mouth daily. 10/06/20  Yes McElwee, Lauren A, NP  promethazine-dextromethorphan (PROMETHAZINE-DM) 6.25-15 MG/5ML syrup Take 5 mLs by mouth 4 (four) times daily as needed for cough. 11/07/20  Yes Eusebio Friendly B, PA-C  sertraline (ZOLOFT) 100 MG tablet Take 1 tablet (100 mg total) by mouth daily. 03/24/20  Yes Cannady, Jolene T, NP  vitamin C (ASCORBIC ACID) 250 MG tablet Take 250 mg by mouth daily.   Yes [provider]  Biotin 1000 MCG CHEW Chew 1,000 mcg by mouth daily. Patient not taking: No sig reported    [provider]  metroNIDAZOLE (METROGEL VAGINAL) 0.75 % vaginal gel Place 1 Applicatorful vaginally at bedtime. Patient not taking: No sig reported 02/22/20   Valentino Nose, NP  Omega-3 Fatty Acids (FISH  OIL) 1000 MG CAPS Take 1,000 mg by mouth daily. Patient not taking: No sig reported    [provider]    Family History Family History  Problem Relation Age of Onset  . Healthy Mother   . Colon cancer Father   . Hashimoto's thyroiditis Sister   . Breast cancer Maternal Grandmother   . Diabetes Maternal Grandmother   . Breast cancer Paternal Grandmother   . COPD Paternal Grandmother   . Brain cancer Paternal Grandfather     Social History Social History   Tobacco Use  . Smoking status: Former Smoker    Types: Cigarettes    Quit date: 07/13/2010    Years since quitting: 10.3  . Smokeless tobacco: Never Used  Vaping Use  . Vaping Use: Never used  Substance Use Topics  . Alcohol use: Yes    Comment: rare  . Drug use: No     Allergies   Patient has no known allergies.   Review  of Systems Review of Systems  Constitutional: Negative for chills, diaphoresis, fatigue and fever.  HENT: Positive for congestion, ear pain, rhinorrhea and sore throat. Negative for sinus pressure and sinus pain.   Respiratory: Positive for cough. Negative for shortness of breath.   Gastrointestinal: Negative for abdominal pain, nausea and vomiting.  Musculoskeletal: Negative for arthralgias and myalgias.  Skin: Negative for rash.  Neurological: Negative for weakness and headaches.  Hematological: Negative for adenopathy.     Physical Exam Triage Vital Signs ED Triage Vitals  Enc Vitals Group     BP 11/07/20 1250 126/83     Pulse Rate 11/07/20 1250 92     Resp 11/07/20 1250 18     Temp 11/07/20 1250 98.1 F (36.7 C)     Temp Source 11/07/20 1250 Oral     SpO2 11/07/20 1250 99 %     Weight 11/07/20 1251 180 lb (81.6 kg)     Height 11/07/20 1251 5\' 3"  (1.6 m)     Head Circumference --      Peak Flow --      Pain Score 11/07/20 1250 2     Pain Loc --      Pain Edu? --      Excl. in GC? --    No data found.  Updated Vital Signs BP 126/83 (BP Location: Left Arm)   Pulse 92   Temp 98.1 F (36.7 C) (Oral)   Resp 18   Ht 5\' 3"  (1.6 m)   Wt 180 lb (81.6 kg)   LMP 11/06/2020 (Exact Date)   SpO2 99%   BMI 31.89 kg/m    Physical Exam Vitals and nursing note reviewed.  Constitutional:      General: She is not in acute distress.    Appearance: Normal appearance. She is not ill-appearing or toxic-appearing.  HENT:     Head: Normocephalic and atraumatic.     Right Ear: Tympanic membrane, ear canal and external ear normal.     Left Ear: Tympanic membrane, ear canal and external ear normal.     Nose: Congestion present. No rhinorrhea.     Comments: Crusted blood bilateral nares    Mouth/Throat:     Mouth: Mucous membranes are moist.     Pharynx: Oropharynx is clear. Posterior oropharyngeal erythema (mild) present.  Eyes:     General: No scleral icterus.       Right eye:  No discharge.        Left eye: No discharge.  Conjunctiva/sclera: Conjunctivae normal.  Cardiovascular:     Rate and Rhythm: Normal rate and regular rhythm.     Heart sounds: Normal heart sounds.  Pulmonary:     Effort: Pulmonary effort is normal. No respiratory distress.     Breath sounds: Normal breath sounds.  Musculoskeletal:     Cervical back: Neck supple.  Skin:    General: Skin is dry.  Neurological:     General: No focal deficit present.     Mental Status: She is alert. Mental status is at baseline.     Motor: No weakness.     Gait: Gait normal.  Psychiatric:        Mood and Affect: Mood normal.        Behavior: Behavior normal.        Thought Content: Thought content normal.      UC Treatments / Results  Labs (all labs ordered are listed, but only abnormal results are displayed) Labs Reviewed  GROUP A STREP BY PCR  SARS CORONAVIRUS 2 (TAT 6-24 HRS)    EKG   Radiology No results found.  Procedures Procedures (including critical care time)  Medications Ordered in UC Medications - No data to display  Initial Impression / Assessment and Plan / UC Course  I have reviewed the triage vital signs and the nursing notes.  Pertinent labs & imaging results that were available during my care of the patient were reviewed by me and considered in my medical decision making (see chart for details).   35 year old female presenting for 4-day history of nasal congestion and new onset sore throat and left-sided ear pain yesterday.  Vital signs are normal and stable.  She is overall well-appearing.  Molecular strep testing negative.  Covid test performed.  Current CDC guidelines, isolation protocol and ED precautions reviewed.  Advised patient she has a viral URI and supportive care encouraged.  Advised her not to blow her nose and to use nasal saline to clean out.  I have sent Atrovent nasal spray as well and Promethazine DM.  Encouraged increasing rest and fluids.   Return and ED precautions reviewed with patient.   Final Clinical Impressions(s) / UC Diagnoses   Final diagnoses:  Viral upper respiratory tract infection  Ear pain, left  Sore throat     Discharge Instructions     Strep test is negative.  URI/COLD SYMPTOMS: Your exam today is consistent with a viral illness. Antibiotics are not indicated at this time. Use medications as directed, including cough syrup, nasal saline, and decongestants. Your symptoms should improve over the next few days and resolve within 7-10 days. Increase rest and fluids. F/u if symptoms worsen or predominate such as sore throat, ear pain, productive cough, shortness of breath, or if you develop high fevers or worsening fatigue over the next several days.    You have received COVID testing today either for positive exposure, concerning symptoms that could be related to COVID infection, screening purposes, or re-testing after confirmed positive.  Your test obtained today checks for active viral infection in the last 1-2 weeks. If your test is negative now, you can still test positive later. So, if you do develop symptoms you should either get re-tested and/or isolate x 5 days and then strict mask use x 5 days (unvaccinated) or mask use x 10 days (vaccinated). Please follow CDC guidelines.  While Rapid antigen tests come back in 15-20 minutes, send out PCR/molecular test results typically come back within 1-3 days. In the  mean time, if you are symptomatic, assume this could be a positive test and treat/monitor yourself as if you do have COVID.   We will call with test results if positive. Please download the MyChart app and set up a profile to access test results.   If symptomatic, go home and rest. Push fluids. Take Tylenol as needed for discomfort. Gargle warm salt water. Throat lozenges. Take Mucinex DM or Robitussin for cough. Humidifier in bedroom to ease coughing. Warm showers. Also review the COVID handout for more  information.  COVID-19 INFECTION: The incubation period of COVID-19 is approximately 14 days after exposure, with most symptoms developing in roughly 4-5 days. Symptoms may range in severity from mild to critically severe. Roughly 80% of those infected will have mild symptoms. People of any age may become infected with COVID-19 and have the ability to transmit the virus. The most common symptoms include: fever, fatigue, cough, body aches, headaches, sore throat, nasal congestion, shortness of breath, nausea, vomiting, diarrhea, changes in smell and/or taste.    COURSE OF ILLNESS Some patients may begin with mild disease which can progress quickly into critical symptoms. If your symptoms are worsening please call ahead to the Emergency Department and proceed there for further treatment. Recovery time appears to be roughly 1-2 weeks for mild symptoms and 3-6 weeks for severe disease.   GO IMMEDIATELY TO ER FOR FEVER YOU ARE UNABLE TO GET DOWN WITH TYLENOL, BREATHING PROBLEMS, CHEST PAIN, FATIGUE, LETHARGY, INABILITY TO EAT OR DRINK, ETC  QUARANTINE AND ISOLATION: To help decrease the spread of COVID-19 please remain isolated if you have COVID infection or are highly suspected to have COVID infection. This means -stay home and isolate to one room in the home if you live with others. Do not share a bed or bathroom with others while ill, sanitize and wipe down all countertops and keep common areas clean and disinfected. Stay home for 5 days. If you have no symptoms or your symptoms are resolving after 5 days, you can leave your house. Continue to wear a mask around others for 5 additional days. If you have been in close contact (within 6 feet) of someone diagnosed with COVID 19, you are advised to quarantine in your home for 14 days as symptoms can develop anywhere from 2-14 days after exposure to the virus. If you develop symptoms, you  must isolate.  Most current guidelines for COVID after  exposure -unvaccinated: isolate 5 days and strict mask use x 5 days. Test on day 5 is possible -vaccinated: wear mask x 10 days if symptoms do not develop -You do not necessarily need to be tested for COVID if you have + exposure and  develop symptoms. Just isolate at home x10 days from symptom onset During this global pandemic, CDC advises to practice social distancing, try to stay at least 57ft away from others at all times. Wear a face covering. Wash and sanitize your hands regularly and avoid going anywhere that is not necessary.  KEEP IN MIND THAT THE COVID TEST IS NOT 100% ACCURATE AND YOU SHOULD STILL DO EVERYTHING TO PREVENT POTENTIAL SPREAD OF VIRUS TO OTHERS (WEAR MASK, WEAR GLOVES, WASH HANDS AND SANITIZE REGULARLY). IF INITIAL TEST IS NEGATIVE, THIS MAY NOT MEAN YOU ARE DEFINITELY NEGATIVE. MOST ACCURATE TESTING IS DONE 5-7 DAYS AFTER EXPOSURE.   It is not advised by CDC to get re-tested after receiving a positive COVID test since you can still test positive for weeks to months after you  have already cleared the virus.   *If you have not been vaccinated for COVID, I strongly suggest you consider getting vaccinated as long as there are no contraindications.      ED Prescriptions    Medication Sig Dispense Auth. Provider   promethazine-dextromethorphan (PROMETHAZINE-DM) 6.25-15 MG/5ML syrup Take 5 mLs by mouth 4 (four) times daily as needed for cough. 118 mL Eusebio FriendlyEaves, Phil Corti B, PA-C   ipratropium (ATROVENT) 0.06 % nasal spray Place 2 sprays into both nostrils 4 (four) times daily. 15 mL Shirlee LatchEaves, Josejuan Hoaglin B, PA-C     PDMP not reviewed this encounter.   Shirlee Latchaves, Aasha Dina B, PA-C 11/07/20 1343

## 2020-11-07 NOTE — ED Triage Notes (Signed)
Patient in today c/o nasal congestion (bloody) x 4 days, sore throat x last night and left ear pain x this morning. Patient denies fever. Patient has tried OTC Dayquil, Mucinex and Sudafed. Patient has also been taking Flonase. Patient has had the 1st dose of the covid vaccines.

## 2020-11-12 ENCOUNTER — Telehealth: Payer: Medicaid Other | Admitting: Physician Assistant

## 2020-11-12 DIAGNOSIS — J0140 Acute pansinusitis, unspecified: Secondary | ICD-10-CM

## 2020-11-12 MED ORDER — AMOXICILLIN-POT CLAVULANATE 875-125 MG PO TABS: 1.0000 | ORAL_TABLET | Freq: Two times a day (BID) | ORAL | 0 refills | Status: DC

## 2020-11-12 NOTE — Progress Notes (Signed)
We are sorry that you are not feeling well.  Here is how we plan to help!  Based on what you have shared with me it looks like you have sinusitis.  Sinusitis is inflammation and infection in the sinus cavities of the head.  Based on your presentation I believe you most likely have Acute Bacterial Sinusitis.  This is an infection caused by bacteria and is treated with antibiotics. I have prescribed Augmentin 875mg/125mg one tablet twice daily with food, for 7 days. You may use an oral decongestant such as Mucinex D or if you have glaucoma or high blood pressure use plain Mucinex. Saline nasal spray help and can safely be used as often as needed for congestion.  If you develop worsening sinus pain, fever or notice severe headache and vision changes, or if symptoms are not better after completion of antibiotic, please schedule an appointment with a health care provider.    Sinus infections are not as easily transmitted as other respiratory infection, however we still recommend that you avoid close contact with loved ones, especially the very young and elderly.  Remember to wash your hands thoroughly throughout the day as this is the number one way to prevent the spread of infection!  Home Care:  Only take medications as instructed by your medical team.  Complete the entire course of an antibiotic.  Do not take these medications with alcohol.  A steam or ultrasonic humidifier can help congestion.  You can place a towel over your head and breathe in the steam from hot water coming from a faucet.  Avoid close contacts especially the very young and the elderly.  Cover your mouth when you cough or sneeze.  Always remember to wash your hands.  Get Help Right Away If:  You develop worsening fever or sinus pain.  You develop a severe head ache or visual changes.  Your symptoms persist after you have completed your treatment plan.  Make sure you  Understand these instructions.  Will watch your  condition.  Will get help right away if you are not doing well or get worse.  Your e-visit answers were reviewed by a board certified advanced clinical practitioner to complete your personal care plan.  Depending on the condition, your plan could have included both over the counter or prescription medications.  If there is a problem please reply  once you have received a response from your provider.  Your safety is important to us.  If you have drug allergies check your prescription carefully.    You can use MyChart to ask questions about today's visit, request a non-urgent call back, or ask for a work or school excuse for 24 hours related to this e-Visit. If it has been greater than 24 hours you will need to follow up with your provider, or enter a new e-Visit to address those concerns.  You will get an e-mail in the next two days asking about your experience.  I hope that your e-visit has been valuable and will speed your recovery. Thank you for using e-visits.  I provided 5 minutes of non face-to-face time during this encounter for chart review and documentation.   

## 2020-12-14 ENCOUNTER — Other Ambulatory Visit: Payer: Self-pay | Admitting: Family Medicine

## 2020-12-14 NOTE — Telephone Encounter (Signed)
Requested medication (s) are due for refill today: yes  Requested medication (s) are on the active medication list: yes  Last refill:  09/03/20  Future visit scheduled: yes  Notes to clinic:  overdue lab work  and appt   Requested Prescriptions  Pending Prescriptions Disp Refills   levothyroxine (SYNTHROID) 125 MCG tablet [Pharmacy Med Name: LEVOTHYROXINE 125 MCG TABLET] 90 tablet 0    Sig: TAKE 1 TABLET BY MOUTH EVERY DAY      Endocrinology:  Hypothyroid Agents Failed - 12/14/2020 12:16 PM      Failed - TSH needs to be rechecked within 3 months after an abnormal result. Refill until TSH is due.      Failed - TSH in normal range and within 360 days    TSH  Date Value Ref Range Status  09/02/2020 6.420 (H) 0.450 - 4.500 uIU/mL Final          Passed - Valid encounter within last 12 months    Recent Outpatient Visits           3 months ago Fatigue, unspecified type   [facility] Caro Laroche, DO   10 months ago Annual physical exam   [facility] Valentino Nose, NP   1 year ago GAD (generalized anxiety disorder)   [facility] Valentino Nose, NP   1 year ago Mood changes   [facility] Valentino Nose, NP       Future Appointments             In 1 month McElwee, Jake Church, NP [facility]

## 2020-12-15 ENCOUNTER — Telehealth: Payer: Self-pay

## 2020-12-15 NOTE — Telephone Encounter (Signed)
-----   Message from Gerre Scull, NP sent at 12/15/2020  8:30 AM EDT ----- Regarding: Needs lab appointment She ask for a refill of her synthroid. I sent in 30 days. She was just started on this medication in February and needs an appointment for lab to recheck her thyroid for any more refills, just to make sure she is on the right dose. Thank you.

## 2020-12-15 NOTE — Telephone Encounter (Signed)
Scheduled 7/1

## 2020-12-15 NOTE — Telephone Encounter (Signed)
Called pt to schedule 1 month f/u no answer vm not set up

## 2021-01-09 ENCOUNTER — Other Ambulatory Visit: Payer: Self-pay | Admitting: Nurse Practitioner

## 2021-01-09 NOTE — Telephone Encounter (Signed)
  Notes to clinic: Patient has appt on 01/23/2021 Review for another refill until appt    Requested Prescriptions  Pending Prescriptions Disp Refills   levothyroxine (SYNTHROID) 125 MCG tablet [Pharmacy Med Name: LEVOTHYROXINE 125 MCG TABLET] 30 tablet 0    Sig: TAKE 1 TABLET BY MOUTH EVERY DAY      Endocrinology:  Hypothyroid Agents Failed - 01/09/2021 12:31 PM      Failed - TSH needs to be rechecked within 3 months after an abnormal result. Refill until TSH is due.      Failed - TSH in normal range and within 360 days    TSH  Date Value Ref Range Status  09/02/2020 6.420 (H) 0.450 - 4.500 uIU/mL Final          Passed - Valid encounter within last 12 months    Recent Outpatient Visits           4 months ago Fatigue, unspecified type   [facility] Caro Laroche, DO   11 months ago Annual physical exam   [facility] Valentino Nose, NP   1 year ago GAD (generalized anxiety disorder)   [facility] Valentino Nose, NP   1 year ago Mood changes   [facility] Valentino Nose, NP       Future Appointments             In 2 weeks McElwee, Jake Church, NP [facility]

## 2021-01-09 NOTE — Telephone Encounter (Signed)
Scheduled 7/1

## 2021-01-21 ENCOUNTER — Telehealth: Payer: Medicaid Other | Admitting: Family

## 2021-01-21 DIAGNOSIS — J029 Acute pharyngitis, unspecified: Secondary | ICD-10-CM

## 2021-01-21 MED ORDER — AMOXICILLIN 500 MG PO CAPS: 500.0000 mg | ORAL_CAPSULE | Freq: Three times a day (TID) | ORAL | 0 refills | Status: AC

## 2021-01-21 NOTE — Progress Notes (Signed)

## 2021-01-23 ENCOUNTER — Encounter: Payer: Medicaid Other | Admitting: Nurse Practitioner

## 2021-01-23 ENCOUNTER — Telehealth: Payer: Medicaid Other | Admitting: Family Medicine

## 2021-01-23 DIAGNOSIS — U071 COVID-19: Secondary | ICD-10-CM

## 2021-01-23 MED ORDER — BENZONATATE 100 MG PO CAPS: 100.0000 mg | ORAL_CAPSULE | Freq: Three times a day (TID) | ORAL | 0 refills | Status: DC | PRN

## 2021-01-23 MED ORDER — ALBUTEROL SULFATE HFA 108 (90 BASE) MCG/ACT IN AERS: 1.0000 | INHALATION_SPRAY | Freq: Four times a day (QID) | RESPIRATORY_TRACT | 0 refills | Status: DC | PRN

## 2021-01-23 MED ORDER — PROMETHAZINE-DM 6.25-15 MG/5ML PO SYRP: 5.0000 mL | ORAL_SOLUTION | Freq: Four times a day (QID) | ORAL | 0 refills | Status: DC | PRN

## 2021-01-23 NOTE — Progress Notes (Signed)
Maureen Hoffman, Maureen Hoffman are scheduled for a virtual visit with your provider today.    Just as we do with appointments in the office, we must obtain your consent to participate.  Your consent will be active for this visit and any virtual visit you may have with one of our providers in the next 365 days.    If you have a MyChart account, I can also send a copy of this consent to you electronically.  All virtual visits are billed to your insurance company just like a traditional visit in the office.  As this is a virtual visit, video technology does not allow for your provider to perform a traditional examination.  This may limit your provider's ability to fully assess your condition.  If your provider identifies any concerns that need to be evaluated in person or the need to arrange testing such as labs, EKG, etc, we will make arrangements to do so.    Although advances in technology are sophisticated, we cannot ensure that it will always work on either your end or our end.  If the connection with a video visit is poor, we may have to switch to a telephone visit.  With either a video or telephone visit, we are not always able to ensure that we have a secure connection.   I need to obtain your verbal consent now.   Are you willing to proceed with your visit today?   Maureen Hoffman has provided verbal consent on 01/23/2021 for a virtual visit (video or telephone). '     E-Visit  for Positive Covid Test Result  We are sorry you are not feeling well. We are here to help!  You have tested positive for COVID-19, meaning that you were infected with the novel coronavirus and could give the virus to others.  It is vitally important that you stay home so you do not spread it to others.      Please continue isolation at home, for at least 10 days since the start of your symptoms and until you have had 24 hours with no fever (without taking a fever reducer) and with improving of symptoms.  If you have no symptoms but  tested positive (or all symptoms resolve after 5 days and you have no fever) you can leave your house but continue to wear a mask around others for an additional 5 days. If you have a fever,continue to stay home until you have had 24 hours of no fever. Most cases improve 5-10 days from onset but we have seen a small number of patients who have gotten worse after the 10 days.  Please be sure to watch for worsening symptoms and remain taking the proper precautions.   Go to the nearest hospital ED for assessment if fever/cough/breathlessness are severe or illness seems like a threat to life.    The following symptoms may appear 2-14 days after exposure: Fever Cough Shortness of breath or difficulty breathing Chills Repeated shaking with chills Muscle pain Headache Sore throat New loss of taste or smell Fatigue Congestion or runny nose Nausea or vomiting Diarrhea  You have been enrolled in MyChart Home Monitoring for COVID-19. Daily you will receive a questionnaire within the MyChart website. Our COVID-19 response team will be monitoring your responses daily.  You can use medication such as prescription cough medication called Tessalon Perles 100 mg. You may take 1-2 capsules every 8 hours as needed for cough,  prescription inhaler called Albuterol MDI 90 mcg Salvadore Farber  2 puffs every 4 hours as needed for shortness of breath, wheezing, cough, and prescription cough medication called Phenergan DM 6.25 mg/15 mg. You make take one teaspoon / 5 ml every 4-6 hours as needed for cough  You may also take acetaminophen (Tylenol) as needed for fever.  HOME CARE: Only take medications as instructed by your medical team. Drink plenty of fluids and get plenty of rest. A steam or ultrasonic humidifier can help if you have congestion.   GET HELP RIGHT AWAY IF YOU HAVE EMERGENCY WARNING SIGNS.  Call 911 or proceed to your closest emergency facility if: You develop worsening high fever. Trouble  breathing Bluish lips or face Persistent pain or pressure in the chest New confusion Inability to wake or stay awake You cough up blood. Your symptoms become more severe Inability to hold down food or fluids  This list is not all possible symptoms. Contact your medical provider for any symptoms that are severe or concerning to you.    Your e-visit answers were reviewed by a board certified advanced clinical practitioner to complete your personal care plan.  Depending on the condition, your plan could have included both over the counter or prescription medications.  If there is a problem please reply once you have received a response from your provider.  Your safety is important to [place].  If you have drug allergies check your prescription carefully.    You can use MyChart to ask questions about today's visit, request a non-urgent call back, or ask for a work or school excuse for 24 hours related to this e-Visit. If it has been greater than 24 hours you will need to follow up with your provider, or enter a new e-Visit to address those concerns. You will get an e-mail in the next two days asking about your experience.  I hope that your e-visit has been valuable and will speed your recovery. Thank you for using e-visits.     I provided 10 minutes of non face-to-face time during this encounter for chart review and documentation.    Maureen Finner, NP 01/23/2021  7:00 PM

## 2021-01-30 ENCOUNTER — Encounter: Payer: Medicaid Other | Admitting: Nurse Practitioner

## 2021-02-16 ENCOUNTER — Other Ambulatory Visit: Payer: Self-pay | Admitting: Nurse Practitioner

## 2021-02-16 NOTE — Telephone Encounter (Signed)
Requested medication (s) are due for refill today:  yes   Requested medication (s) are on the active medication list: yes  Last refill:  01/10/2021  Future visit scheduled:  no  Notes to clinic: Failed protocol: TSH needs to be rechecked within 3 months after an abnormal result. Refill until TSH is due   Requested Prescriptions  Pending Prescriptions Disp Refills   levothyroxine (SYNTHROID) 125 MCG tablet [Pharmacy Med Name: LEVOTHYROXINE 125 MCG TABLET] 30 tablet 0    Sig: TAKE 1 TABLET BY MOUTH EVERY DAY      Endocrinology:  Hypothyroid Agents Failed - 02/16/2021  1:30 PM      Failed - TSH needs to be rechecked within 3 months after an abnormal result. Refill until TSH is due.      Failed - TSH in normal range and within 360 days    TSH  Date Value Ref Range Status  09/02/2020 6.420 (H) 0.450 - 4.500 uIU/mL Final          Passed - Valid encounter within last 12 months    Recent Outpatient Visits           5 months ago Fatigue, unspecified type   [facility] Caro Laroche, DO   1 year ago Annual physical exam   [facility] Valentino Nose, NP   1 year ago GAD (generalized anxiety disorder)   [facility] Valentino Nose, NP   1 year ago Mood changes   [facility] Valentino Nose, NP

## 2021-02-19 NOTE — Telephone Encounter (Signed)
Patient is overdue for lab to check thyroid level. Please call and schedule lab visit for patient before refill. Order is in.

## 2021-02-20 NOTE — Telephone Encounter (Signed)
Pt has lab scheduled for 02/20/2021

## 2021-02-23 ENCOUNTER — Other Ambulatory Visit: Payer: Medicaid Other

## 2021-03-03 ENCOUNTER — Other Ambulatory Visit: Payer: Self-pay | Admitting: Nurse Practitioner

## 2021-03-03 NOTE — Telephone Encounter (Signed)
Patient needs appointment.

## 2021-03-04 NOTE — Telephone Encounter (Signed)
Requested medication (s) are due for refill today: yes   Requested medication (s) are on the active medication list: yes   Last refill:  11/14/2020  Future visit scheduled:no  Notes to clinic:  overdue for 6 month follow up Message has been sent for patient to contact office.    Requested Prescriptions  Pending Prescriptions Disp Refills   sertraline (ZOLOFT) 100 MG tablet [Pharmacy Med Name: SERTRALINE HCL 100 MG TABLET] 90 tablet 3    Sig: TAKE 1 TABLET BY MOUTH EVERY DAY      Psychiatry:  Antidepressants - SSRI Failed - 03/03/2021  5:30 PM      Failed - Completed PHQ-2 or PHQ-9 in the last 360 days      Failed - Valid encounter within last 6 months    Recent Outpatient Visits           6 months ago Fatigue, unspecified type   [facility] Caro Laroche, DO   1 year ago Annual physical exam   [facility] Valentino Nose, NP   1 year ago GAD (generalized anxiety disorder)   [facility] Valentino Nose, NP   1 year ago Mood changes   [facility] Valentino Nose, NP

## 2021-03-04 NOTE — Telephone Encounter (Signed)
Needs appointment for future refills.

## 2021-03-05 ENCOUNTER — Ambulatory Visit: Payer: Medicaid Other | Admitting: Nurse Practitioner

## 2021-04-05 ENCOUNTER — Other Ambulatory Visit: Payer: Self-pay | Admitting: Nurse Practitioner

## 2021-04-05 NOTE — Telephone Encounter (Signed)
Requested medication (s) are due for refill today: yes  Requested medication (s) are on the active medication list: yes  Last refill:  03/04/21 #30  Future visit scheduled: no  Notes to clinic:  Pt refused to make appt today- stated she will call back "tomorrow" to make appt   Requested Prescriptions  Pending Prescriptions Disp Refills   sertraline (ZOLOFT) 100 MG tablet [Pharmacy Med Name: SERTRALINE HCL 100 MG TABLET] 90 tablet 1    Sig: Take 1 tablet (100 mg total) by mouth daily. Need appointment for future refills.     Psychiatry:  Antidepressants - SSRI Failed - 04/05/2021 11:33 AM      Failed - Completed PHQ-2 or PHQ-9 in the last 360 days      Failed - Valid encounter within last 6 months    Recent Outpatient Visits           7 months ago Fatigue, unspecified type   [facility] Caro Laroche, DO   1 year ago Annual physical exam   [facility] Valentino Nose, NP   1 year ago GAD (generalized anxiety disorder)   [facility] Valentino Nose, NP   1 year ago Mood changes   [facility] Valentino Nose, NP

## 2021-04-06 NOTE — Telephone Encounter (Signed)
Needs an appointment for any refills. She was notified of this last month when courtesy 30 day refill given.

## 2021-04-22 ENCOUNTER — Other Ambulatory Visit: Payer: Self-pay | Admitting: Nurse Practitioner

## 2021-05-07 ENCOUNTER — Ambulatory Visit: Payer: Medicaid Other | Admitting: Nurse Practitioner

## 2021-05-07 ENCOUNTER — Other Ambulatory Visit: Payer: Self-pay

## 2021-05-07 ENCOUNTER — Encounter: Payer: Self-pay | Admitting: Nurse Practitioner

## 2021-05-07 VITALS — BP 158/102 | HR 77 | Ht 63.0 in | Wt 180.0 lb

## 2021-05-07 DIAGNOSIS — M542 Cervicalgia: Secondary | ICD-10-CM

## 2021-05-07 DIAGNOSIS — R7989 Other specified abnormal findings of blood chemistry: Secondary | ICD-10-CM

## 2021-05-07 DIAGNOSIS — E669 Obesity, unspecified: Secondary | ICD-10-CM

## 2021-05-07 DIAGNOSIS — F32A Depression, unspecified: Secondary | ICD-10-CM | POA: Diagnosis not present

## 2021-05-07 DIAGNOSIS — R030 Elevated blood-pressure reading, without diagnosis of hypertension: Secondary | ICD-10-CM | POA: Diagnosis not present

## 2021-05-07 DIAGNOSIS — F419 Anxiety disorder, unspecified: Secondary | ICD-10-CM

## 2021-05-07 MED ORDER — SERTRALINE HCL 100 MG PO TABS: 100.0000 mg | ORAL_TABLET | Freq: Every day | ORAL | 1 refills | Status: DC

## 2021-05-07 MED ORDER — BUPROPION HCL ER (XL) 150 MG PO TB24: 150.0000 mg | ORAL_TABLET | Freq: Every day | ORAL | 1 refills | Status: DC

## 2021-05-07 MED ORDER — CYCLOBENZAPRINE HCL 10 MG PO TABS: 10.0000 mg | ORAL_TABLET | Freq: Three times a day (TID) | ORAL | 0 refills | Status: DC | PRN

## 2021-05-07 MED ORDER — GABAPENTIN 300 MG PO CAPS: 300.0000 mg | ORAL_CAPSULE | Freq: Two times a day (BID) | ORAL | 1 refills | Status: DC

## 2021-05-07 NOTE — Patient Instructions (Signed)
Start checking your blood pressure once a day and write it down

## 2021-05-07 NOTE — Progress Notes (Signed)
Established Patient Office Visit  Subjective:  Patient ID: Maureen Hoffman, female    DOB: 02-01-1986  Age: 35 y.o. MRN: [id number]  CC:  Chief Complaint  Patient presents with   Depression    HPI Maureen Hoffman presents for follow up on depression, subclinical hypothyroid along with neck pain and wanting to lose weight.   NECK PAIN  Has been ongoing for 6 months. She does not remember injuring it. She feels pain on the right side of her posterior neck. The pain is intermittent, but occurs every day. She describes the pain as aching and burning that radiates down into her shoulder. Today 1-2/10, but when the pain is bad it is 7-8/10. She will rest and take Ibuprofen or tylenol. Ibuprofen will help some. She also uses a massage gun. She denies fevers and limited range of motion.   WEIGHT GAIN  She states that ever since she had her second child, she has had a hard time losing weight. She has been trying to watch the amount of food she is eating. 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 She has not taken any medications in the past for weight loss. Her weight today is close to the highest weight she has been. She endorses knee pain and shortness of breath with strenuous exertion. She denies chest pain.   DEPRESSION  Mood status: exacerbated Satisfied with current treatment?: yes Symptom severity: moderate  Duration of current treatment : years Side effects: no Medication compliance: excellent compliance Psychotherapy/counseling: no  Previous psychiatric medications: buspar and zoloft Depressed mood: no Anxious mood: no Anhedonia: no Significant weight loss or gain: yes Insomnia: yes hard to stay asleep Fatigue: yes Feelings of worthlessness or guilt: no Impaired concentration/indecisiveness: yes Suicidal ideations: no Hopelessness: no Crying spells: no Depression screen [facility] 2/9 05/07/2021 01/22/2020 12/11/2019 11/13/2019  Decreased Interest 3 0 0 0   Down, Depressed, Hopeless 2 0 0 0  PHQ - 2 Score 5 0 0 0  Altered sleeping '2 3 2 1  ' Tired, decreased energy '3 1 1 1  ' Change in appetite 3 2 0 1  Feeling bad or failure about yourself  3 1 0 0  Trouble concentrating 1 0 0 1  Moving slowly or fidgety/restless 0 0 0 0  Suicidal thoughts 0 0 0 0  PHQ-9 Score '17 7 3 4  ' Difficult doing work/chores - Somewhat difficult Somewhat difficult Somewhat difficult   GAD 7 : Generalized Anxiety Score 05/07/2021 09/02/2020 01/22/2020 12/11/2019  Nervous, Anxious, on Edge '1 3 1 1  ' Control/stop worrying 2 2 0 1  Worry too much - different things '1 3 1 1  ' Trouble relaxing 3 3 0 1  Restless 1 2 0 0  Easily annoyed or irritable '3 1 1 1  ' Afraid - awful might happen 1 0 0 0  Total GAD 7 Score '12 14 3 5  ' Anxiety Difficulty Extremely difficult - Somewhat difficult Somewhat difficult    Past Medical History:  Diagnosis Date   Mood swings    Seizures (HCC)    as a child   Thrombocytopenia affecting pregnancy, antepartum ([place]) 12/03/2016   Thrombocytopenia complicating pregnancy ([place])    2018   Thyroid disease     Past Surgical History:  Procedure Laterality Date   NO PAST SURGERIES      Family History  Problem Relation Age of Onset   Healthy Mother    Colon cancer Father  Hashimoto's thyroiditis Sister    Breast cancer Maternal Grandmother    Diabetes Maternal Grandmother    Breast cancer Paternal Grandmother    COPD Paternal Grandmother    Brain cancer Paternal Grandfather     Social History   Socioeconomic History   Marital status: Single    Spouse name: Not on file   Number of children: Not on file   Years of education: Not on file   Highest education level: Not on file  Occupational History   Not on file  Tobacco Use   Smoking status: Former    Types: Cigarettes    Quit date: 07/13/2010    Years since quitting: 10.8   Smokeless tobacco: Never  Vaping Use   Vaping Use: Never used  Substance and Sexual Activity   Alcohol  use: Yes    Comment: rare   Drug use: No   Sexual activity: Yes    Birth control/protection: Pill  Other Topics Concern   Not on file  Social History Narrative   Not on file   Social Determinants of Health   Financial Resource Strain: Not on file  Food Insecurity: Not on file  Transportation Needs: Not on file  Physical Activity: Not on file  Stress: Not on file  Social Connections: Not on file  Intimate Partner Violence: Not on file    Outpatient Medications Prior to Visit  Medication Sig Dispense Refill   busPIRone (BUSPAR) 7.5 MG tablet TAKE 1 TABLET (7.5 MG TOTAL) BY MOUTH 2 (TWO) TIMES DAILY AS NEEDED (ANXIETY, NERVOUSNESS). 180 tablet 1   fluticasone (FLONASE) 50 MCG/ACT nasal spray Place 2 sprays into both nostrils daily. 16 g 6   ipratropium (ATROVENT) 0.06 % nasal spray Place 2 sprays into both nostrils 4 (four) times daily. 15 mL 12   norgestimate-ethinyl estradiol (ORTHO-CYCLEN) 0.25-35 MG-MCG tablet Take 1 tablet by mouth daily. 84 tablet 3   levothyroxine (SYNTHROID) 125 MCG tablet TAKE 1 TABLET BY MOUTH EVERY DAY 3 tablet 0   albuterol (PROAIR HFA) 108 (90 Base) MCG/ACT inhaler Inhale 1-2 puffs into the lungs every 6 (six) hours as needed for wheezing or shortness of breath. 8 g 0   vitamin C (ASCORBIC ACID) 250 MG tablet Take 250 mg by mouth daily.     benzonatate (TESSALON) 100 MG capsule Take 1 capsule (100 mg total) by mouth 3 (three) times daily as needed. 30 capsule 0   Biotin 1000 MCG CHEW Chew 1,000 mcg by mouth daily. (Patient not taking: No sig reported)     metroNIDAZOLE (METROGEL VAGINAL) 0.75 % vaginal gel Place 1 Applicatorful vaginally at bedtime. (Patient not taking: No sig reported) 70 g 0   Omega-3 Fatty Acids (FISH OIL) 1000 MG CAPS Take 1,000 mg by mouth daily. (Patient not taking: No sig reported)     promethazine-dextromethorphan (PROMETHAZINE-DM) 6.25-15 MG/5ML syrup Take 5 mLs by mouth 4 (four) times daily as needed for cough. 118 mL 0    sertraline (ZOLOFT) 100 MG tablet Take 1 tablet (100 mg total) by mouth daily. Need appointment for future refills. (Patient not taking: Reported on 05/07/2021) 30 tablet 0   No facility-administered medications prior to visit.    No Known Allergies  ROS Review of Systems  Constitutional:  Positive for fatigue.  Respiratory:  Positive for shortness of breath (with exertion).   Cardiovascular: Negative.   Gastrointestinal: Negative.   Genitourinary: Negative.   Musculoskeletal:  Positive for neck pain.  Neurological: Negative.   Psychiatric/Behavioral:  Positive  for dysphoric mood.      Objective:    Physical Exam Vitals and nursing note reviewed.  Constitutional:      General: She is not in acute distress.    Appearance: Normal appearance.  HENT:     Head: Normocephalic and atraumatic.  Eyes:     Conjunctiva/sclera: Conjunctivae normal.  Cardiovascular:     Rate and Rhythm: Normal rate and regular rhythm.     Pulses: Normal pulses.     Heart sounds: Normal heart sounds.  Pulmonary:     Effort: Pulmonary effort is normal.     Breath sounds: Normal breath sounds.  Abdominal:     Palpations: Abdomen is soft.     Tenderness: There is no abdominal tenderness.  Musculoskeletal:     Cervical back: Normal range of motion.  Skin:    General: Skin is warm and dry.  Neurological:     General: No focal deficit present.     Mental Status: She is alert and oriented to person, place, and time.  Psychiatric:        Mood and Affect: Mood normal.        Behavior: Behavior normal.        Thought Content: Thought content normal.        Judgment: Judgment normal.    BP (!) 158/102   Pulse 77   Ht '5\' 3"'  (1.6 m)   Wt 180 lb (81.6 kg)   BMI 31.89 kg/m  Wt Readings from Last 3 Encounters:  05/07/21 180 lb (81.6 kg)  11/07/20 180 lb (81.6 kg)  09/02/20 185 lb 3.2 oz (84 kg)     Health Maintenance Due  Topic Date Due   COVID-19 Vaccine (2 - Pfizer risk series) 06/16/2020    INFLUENZA VACCINE  Never done    There are no preventive care reminders to display for this patient.  Lab Results  Component Value Date   TSH 7.630 (H) 05/07/2021   Lab Results  Component Value Date   WBC 7.4 05/07/2021   HGB 12.6 05/07/2021   HCT 37.4 05/07/2021   MCV 84 05/07/2021   PLT 218 05/07/2021   Lab Results  Component Value Date   NA 137 05/07/2021   K 3.8 05/07/2021   CO2 20 05/07/2021   GLUCOSE 59 (L) 05/07/2021   BUN 8 05/07/2021   CREATININE 0.58 05/07/2021   BILITOT <0.2 05/07/2021   ALKPHOS 72 05/07/2021   AST 14 05/07/2021   ALT 10 05/07/2021   PROT 7.4 05/07/2021   ALBUMIN 4.4 05/07/2021   CALCIUM 9.1 05/07/2021   ANIONGAP 9 08/11/2018   EGFR 120 05/07/2021   Lab Results  Component Value Date   CHOL 214 (H) 01/22/2020   Lab Results  Component Value Date   HDL 62 01/22/2020   Lab Results  Component Value Date   LDLCALC 118 (H) 01/22/2020   Lab Results  Component Value Date   TRIG 199 (H) 01/22/2020   No results found for: CHOLHDL No results found for: HGBA1C    Assessment & Plan:   Problem List Items Addressed This Visit       Other   Anxiety and depression - Primary    Chronic, exacerbated with recent stressors in her personal life. Her PHQ 9 is a 17 and GAD 7 is a 12. Will refill her zoloft 149m daily. Will also add on wellbutrin 1528mdaily as this can help her mood along with possibly helping with her weight. She denies  SI/HI. Discussed referral for therapy. She is interested in this, however she doesn't think she has time for it right now. Encouraged her to reach out if she would like this referral placed. Follow up in 4-6 weeks or sooner with concerns.       Relevant Medications   sertraline (ZOLOFT) 100 MG tablet   buPROPion (WELLBUTRIN XL) 150 MG 24 hr tablet   Other Relevant Orders   Comprehensive metabolic panel (Completed)   CBC with Differential/Platelet (Completed)   Elevated TSH    Check TSH panel and TPO  antibodies today. She was started on 144mg daily of levothyroxine in February, however this was stopped because she never followed up with repeat labs. Discussed the importance of following up and repeat labs. Will treat based on lab results.       Relevant Orders   Comprehensive metabolic panel (Completed)   Thyroid Panel With TSH (Completed)   Thyroid peroxidase antibody (Completed)   CBC with Differential/Platelet (Completed)   Elevated BP without diagnosis of hypertension    Encouraged her to limit the amount of salt that she is eating and to check her blood pressure daily at home and write it down. May need to start medication if ongoing elevated blood pressure. Follow up in 4-6 weeks.       Neck pain    Chronic. Will treat with gabapentin qHS and flexeril prn. Exercises given to patient. She can continue tylenol and/or ibuprofen along with ice and heat. Consider an x-ray if ongoing pain. Follow up in 4-6 weeks.       Obesity (BMI 30-39.9)    BMI 31.8. Discussed diet, exercise, and healthy eating. With exacerbation in depression and mood, wellbutrin was added as it can help with appetite as well. Has history of subclinical hypothyroidism. Repeating thyroid panel today. Follow up in 4-6 weeks.        Meds ordered this encounter  Medications   gabapentin (NEURONTIN) 300 MG capsule    Sig: Take 1 capsule (300 mg total) by mouth 2 (two) times daily. Start taking 1 capsule at bedtime, if needed after 3 days can increase to twice a day    Dispense:  60 capsule    Refill:  1   cyclobenzaprine (FLEXERIL) 10 MG tablet    Sig: Take 1 tablet (10 mg total) by mouth 3 (three) times daily as needed for muscle spasms.    Dispense:  30 tablet    Refill:  0   sertraline (ZOLOFT) 100 MG tablet    Sig: Take 1 tablet (100 mg total) by mouth daily. Need appointment for future refills.    Dispense:  90 tablet    Refill:  1   buPROPion (WELLBUTRIN XL) 150 MG 24 hr tablet    Sig: Take 1 tablet  (150 mg total) by mouth daily.    Dispense:  30 tablet    Refill:  1   levothyroxine (SYNTHROID) 25 MCG tablet    Sig: Take 1 tablet (25 mcg total) by mouth daily.    Dispense:  30 tablet    Refill:  1     Follow-up: Return in about 4 weeks (around 06/04/2021) for 4-6 weeks, weight, depression, thyroid.    LCharyl Dancer NP

## 2021-05-08 ENCOUNTER — Telehealth: Payer: Self-pay

## 2021-05-08 DIAGNOSIS — E669 Obesity, unspecified: Secondary | ICD-10-CM | POA: Insufficient documentation

## 2021-05-08 DIAGNOSIS — M5412 Radiculopathy, cervical region: Secondary | ICD-10-CM | POA: Insufficient documentation

## 2021-05-08 DIAGNOSIS — M542 Cervicalgia: Secondary | ICD-10-CM | POA: Insufficient documentation

## 2021-05-08 LAB — COMPREHENSIVE METABOLIC PANEL
ALT: 10 IU/L (ref 0–32)
AST: 14 IU/L (ref 0–40)
Albumin/Globulin Ratio: 1.5 (ref 1.2–2.2)
Albumin: 4.4 g/dL (ref 3.8–4.8)
Alkaline Phosphatase: 72 IU/L (ref 44–121)
BUN/Creatinine Ratio: 14 (ref 9–23)
BUN: 8 mg/dL (ref 6–20)
Bilirubin Total: <0.2 mg/dL (ref 0.0–1.2)
CO2: 20 mmol/L (ref 20–29)
Calcium: 9.1 mg/dL (ref 8.7–10.2)
Chloride: 102 mmol/L (ref 96–106)
Creatinine, Ser: 0.58 mg/dL (ref 0.57–1.00)
Globulin, Total: 3 g/dL (ref 1.5–4.5)
Glucose: 59 mg/dL — ABNORMAL LOW (ref 70–99)
Potassium: 3.8 mmol/L (ref 3.5–5.2)
Sodium: 137 mmol/L (ref 134–144)
Total Protein: 7.4 g/dL (ref 6.0–8.5)
eGFR: 120 mL/min/{1.73_m2} (ref >59)

## 2021-05-08 LAB — CBC WITH DIFFERENTIAL/PLATELET
Basophils Absolute: 0 10*3/uL (ref 0.0–0.2)
Basos: 1 %
EOS (ABSOLUTE): 0.1 10*3/uL (ref 0.0–0.4)
Eos: 1 %
Hematocrit: 37.4 % (ref 34.0–46.6)
Hemoglobin: 12.6 g/dL (ref 11.1–15.9)
Immature Grans (Abs): 0 10*3/uL (ref 0.0–0.1)
Immature Granulocytes: 0 %
Lymphocytes Absolute: 2.5 10*3/uL (ref 0.7–3.1)
Lymphs: 34 %
MCH: 28.2 pg (ref 26.6–33.0)
MCHC: 33.7 g/dL (ref 31.5–35.7)
MCV: 84 fL (ref 79–97)
Monocytes Absolute: 0.7 10*3/uL (ref 0.1–0.9)
Monocytes: 10 %
Neutrophils Absolute: 4 10*3/uL (ref 1.4–7.0)
Neutrophils: 54 %
Platelets: 218 10*3/uL (ref 150–450)
RBC: 4.47 x10E6/uL (ref 3.77–5.28)
RDW: 12.8 % (ref 11.7–15.4)
WBC: 7.4 10*3/uL (ref 3.4–10.8)

## 2021-05-08 LAB — THYROID PANEL WITH TSH
Free Thyroxine Index: 1.4 (ref 1.2–4.9)
T3 Uptake Ratio: 18 % — ABNORMAL LOW (ref 24–39)
T4, Total: 8 ug/dL (ref 4.5–12.0)
TSH: 7.63 u[IU]/mL — ABNORMAL HIGH (ref 0.450–4.500)

## 2021-05-08 LAB — THYROID PEROXIDASE ANTIBODY: Thyroperoxidase Ab SerPl-aCnc: 431 IU/mL — ABNORMAL HIGH (ref 0–34)

## 2021-05-08 MED ORDER — LEVOTHYROXINE SODIUM 25 MCG PO TABS: 25.0000 ug | ORAL_TABLET | Freq: Every day | ORAL | 1 refills | Status: DC

## 2021-05-08 NOTE — Telephone Encounter (Signed)
-----   Message from Gerre Scull, NP sent at 05/08/2021  8:03 AM EDT ----- Please let Maureen Hoffman know that her thyroid levels are still slightly elevated. I am going to restart her levothyroxine at daily. It is important that she comes to her next appointment to get her labs rechecked. 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 Her liver, kidneys, and blood counts are normal. Let me know if she has any questions.

## 2021-05-08 NOTE — Assessment & Plan Note (Signed)
Encouraged her to limit the amount of salt that she is eating and to check her blood pressure daily at home and write it down. May need to start medication if ongoing elevated blood pressure. Follow up in 4-6 weeks.

## 2021-05-08 NOTE — Assessment & Plan Note (Signed)
Chronic. Will treat with gabapentin qHS and flexeril prn. Exercises given to patient. She can continue tylenol and/or ibuprofen along with ice and heat. Consider an x-ray if ongoing pain. Follow up in 4-6 weeks.

## 2021-05-08 NOTE — Assessment & Plan Note (Signed)
Chronic, exacerbated with recent stressors in her personal life. Her PHQ 9 is a 17 and GAD 7 is a 12. Will refill her zoloft 100mg  daily. Will also add on wellbutrin 150mg  daily as this can help her mood along with possibly helping with her weight. She denies SI/HI. Discussed referral for therapy. She is interested in this, however she doesn't think she has time for it right now. Encouraged her to reach out if she would like this referral placed. Follow up in 4-6 weeks or sooner with concerns.

## 2021-05-08 NOTE — Telephone Encounter (Signed)
Attempted to contact patient regarding results and recommendations. No answer and unable to leave message.

## 2021-05-08 NOTE — Assessment & Plan Note (Signed)
Check TSH panel and TPO antibodies today. She was started on daily of levothyroxine in February, however this was stopped because she never followed up with repeat labs. Discussed the importance of following up and repeat labs. Will treat based on lab results.

## 2021-05-08 NOTE — Assessment & Plan Note (Signed)
BMI 31.8. Discussed diet, exercise, and healthy eating. With exacerbation in depression and mood, wellbutrin was added as it can help with appetite as well. Has history of subclinical hypothyroidism. Repeating thyroid panel today. Follow up in 4-6 weeks.

## 2021-05-08 NOTE — Assessment & Plan Note (Signed)
>>  ASSESSMENT AND PLAN FOR ELEVATED TSH WRITTEN ON 05/08/2021  8:27 AM BY MCELWEE, LAUREN A, NP  Check TSH panel and TPO antibodies today. She was started on daily of levothyroxine in February, however this was stopped because she never followed up with repeat labs. Discussed the importance of following up and repeat labs. Will treat based on lab results.

## 2021-05-08 NOTE — Assessment & Plan Note (Signed)
>>  ASSESSMENT AND PLAN FOR NECK PAIN WRITTEN ON 05/08/2021  8:33 AM BY MCELWEE, LAUREN A, NP  Chronic. Will treat with gabapentin qHS and flexeril prn. Exercises given to patient. She can continue tylenol and/or ibuprofen along with ice and heat. Consider an x-ray if ongoing pain. Follow up in 4-6 weeks.

## 2021-05-21 MED ORDER — BUPROPION HCL ER (XL) 300 MG PO TB24: 300.0000 mg | ORAL_TABLET | Freq: Every day | ORAL | 1 refills | Status: DC

## 2021-05-30 ENCOUNTER — Other Ambulatory Visit: Payer: Self-pay | Admitting: Nurse Practitioner

## 2021-05-30 NOTE — Telephone Encounter (Signed)
last RF 05/08/21 #30 1 RF

## 2021-06-22 ENCOUNTER — Encounter: Payer: Self-pay | Admitting: Nurse Practitioner

## 2021-06-22 ENCOUNTER — Ambulatory Visit (INDEPENDENT_AMBULATORY_CARE_PROVIDER_SITE_OTHER): Payer: Medicaid Other | Admitting: Nurse Practitioner

## 2021-06-22 ENCOUNTER — Other Ambulatory Visit: Payer: Self-pay

## 2021-06-22 VITALS — BP 134/91 | HR 99 | Temp 98.6°F | Resp 18 | Wt 195.2 lb

## 2021-06-22 DIAGNOSIS — I10 Essential (primary) hypertension: Secondary | ICD-10-CM

## 2021-06-22 DIAGNOSIS — F32A Depression, unspecified: Secondary | ICD-10-CM | POA: Diagnosis not present

## 2021-06-22 DIAGNOSIS — R7989 Other specified abnormal findings of blood chemistry: Secondary | ICD-10-CM

## 2021-06-22 DIAGNOSIS — M542 Cervicalgia: Secondary | ICD-10-CM | POA: Diagnosis not present

## 2021-06-22 DIAGNOSIS — F419 Anxiety disorder, unspecified: Secondary | ICD-10-CM

## 2021-06-22 MED ORDER — PREDNISONE 10 MG PO TABS: ORAL_TABLET | ORAL | 0 refills | Status: DC

## 2021-06-22 MED ORDER — BUPROPION HCL ER (XL) 150 MG PO TB24: 150.0000 mg | ORAL_TABLET | Freq: Every day | ORAL | 1 refills | Status: DC

## 2021-06-22 MED ORDER — TRAMADOL HCL 50 MG PO TABS: 50.0000 mg | ORAL_TABLET | Freq: Three times a day (TID) | ORAL | 0 refills | Status: AC | PRN

## 2021-06-22 NOTE — Assessment & Plan Note (Signed)
>>  ASSESSMENT AND PLAN FOR NECK PAIN WRITTEN ON 06/22/2021  1:40 PM BY MCELWEE, LAUREN A, NP  Chronic, ongoing. Will check cervical spine x-ray today. History and exam consistent with impingement. Prednisone taper and tramadol prn to help with worsening pain. Continue stretches, ibuprofen, and flexeril prn. Can stop gabapentin if not noticing any benefits. PDMP reviewed. Will also place referral to orthopedics.

## 2021-06-22 NOTE — Progress Notes (Signed)
Established Patient Office Visit  Subjective:  Patient ID: Maureen Hoffman, female    DOB: Jun 16, 1986  Age: 35 y.o. MRN: [id number]  CC:  Chief Complaint  Patient presents with   Anxiety   Neck Pain   Hypertension     HPI Maureen Hoffman presents for follow up on neck pain, elevated blood pressure, and depression/anxiety.   NECK PAIN  Neck pain has been ongoing for the last 6 months. She states that the pain has gotten worse in the last several days. Currently her pain is a 6/10. The pain radiates down her right shoulder into her back and then down her arm to her elbow. She started gabapentin with no relief. Flexeril has helped for short term relief. She has been doing stretches daily as well.   HYPERTENSION  She has been checking her blood pressure at home which has been averaging 150s/120s. She states that she does get nervious right before she checks her blood pressure because she is afraid it will be high. She denies chest pain and shortness of breath.   ANXIETY/DEPRESSION  States that she feels overwhelmed, which can make her irritable/snappy. She denies depression/feeling down/having little interest in things. She recently increased her wellbutrin to 344m daily which she feels like has made her more jittery and is not helping with her symptoms as much.   [facility] 06/22/2021 05/07/2021 01/22/2020 12/11/2019 11/13/2019  Decreased Interest 2 3 0 0 0  Down, Depressed, Hopeless 1 2 0 0 0  PHQ - 2 Score 3 5 0 0 0  Altered sleeping '1 2 3 2 1  ' Tired, decreased energy '3 3 1 1 1  ' Change in appetite '2 3 2 ' 0 1  Feeling bad or failure about yourself  0 3 1 0 0  Trouble concentrating 0 1 0 0 1  Moving slowly or fidgety/restless 0 0 0 0 0  Suicidal thoughts 0 0 0 0 0  PHQ-9 Score '9 17 7 3 4  ' Difficult doing work/chores - - Somewhat difficult Somewhat difficult Somewhat difficult   GAD 7 : Generalized Anxiety Score 06/22/2021 05/07/2021 09/02/2020 01/22/2020   Nervous, Anxious, on Edge '3 1 3 1  ' Control/stop worrying 0 2 2 0  Worry too much - different things '1 1 3 1  ' Trouble relaxing '3 3 3 ' 0  Restless '1 1 2 ' 0  Easily annoyed or irritable '3 3 1 1  ' Afraid - awful might happen 0 1 0 0  Total GAD 7 Score '11 12 14 3  ' Anxiety Difficulty Very difficult Extremely difficult - Somewhat difficult    Past Medical History:  Diagnosis Date   Mood swings    Seizures (HCC)    as a child   [place]   [place]    [place]   Thyroid disease     Past Surgical History:  Procedure Laterality Date   NO PAST SURGERIES      Family History  Problem Relation Age of Onset   Healthy Mother    Colon cancer Father    Hashimoto's thyroiditis Sister    Breast cancer Maternal Grandmother    Diabetes Maternal Grandmother    Breast cancer Paternal Grandmother    COPD Paternal Grandmother    Brain cancer Paternal Grandfather     Social History   Socioeconomic History   Marital status: Single    Spouse name: Not on file   Number of children: Not  on file   Years of education: Not on file   Highest education level: Not on file  Occupational History   Not on file  Tobacco Use   Smoking status: Former    Types: Cigarettes    Quit date: 07/13/2010    Years since quitting: 10.9   Smokeless tobacco: Never  Vaping Use   Vaping Use: Never used  Substance and Sexual Activity   Alcohol use: Yes    Comment: rare   Drug use: No   Sexual activity: Yes    Birth control/protection: Pill  Other Topics Concern   Not on file  Social History Narrative   Not on file   Social Determinants of Health   Financial Resource Strain: Not on file  Food Insecurity: Not on file  Transportation Needs: Not on file  Physical Activity: Not on file  Stress: Not on file  Social Connections: Not on file  Intimate Partner Violence: Not on file    Outpatient Medications Prior to  Visit  Medication Sig Dispense Refill   albuterol (PROAIR HFA) 108 (90 Base) MCG/ACT inhaler Inhale 1-2 puffs into the lungs every 6 (six) hours as needed for wheezing or shortness of breath. 8 g 0   busPIRone (BUSPAR) 7.5 MG tablet TAKE 1 TABLET (7.5 MG TOTAL) BY MOUTH 2 (TWO) TIMES DAILY AS NEEDED (ANXIETY, NERVOUSNESS). 180 tablet 1   cyclobenzaprine (FLEXERIL) 10 MG tablet Take 1 tablet (10 mg total) by mouth 3 (three) times daily as needed for muscle spasms. 30 tablet 0   fluticasone (FLONASE) 50 MCG/ACT nasal spray Place 2 sprays into both nostrils daily. 16 g 6   gabapentin (NEURONTIN) 300 MG capsule Take 1 capsule (300 mg total) by mouth 2 (two) times daily. Start taking 1 capsule at bedtime, if needed after 3 days can increase to twice a day 60 capsule 1   ipratropium (ATROVENT) 0.06 % nasal spray Place 2 sprays into both nostrils 4 (four) times daily. 15 mL 12   levothyroxine (SYNTHROID) 25 MCG tablet Take 1 tablet (25 mcg total) by mouth daily. 30 tablet 1   norgestimate-ethinyl estradiol (ORTHO-CYCLEN) 0.25-35 MG-MCG tablet Take 1 tablet by mouth daily. 84 tablet 3   sertraline (ZOLOFT) 100 MG tablet Take 1 tablet (100 mg total) by mouth daily. Need appointment for future refills. 90 tablet 1   vitamin C (ASCORBIC ACID) 250 MG tablet Take 250 mg by mouth daily.     buPROPion (WELLBUTRIN XL) 300 MG 24 hr tablet Take 1 tablet (300 mg total) by mouth daily. 30 tablet 1   No facility-administered medications prior to visit.    No Known Allergies  ROS Review of Systems  Constitutional:  Positive for fatigue.  Respiratory: Negative.    Cardiovascular: Negative.   Gastrointestinal: Negative.   Genitourinary: Negative.   Musculoskeletal:  Positive for neck pain.  Skin: Negative.   Neurological: Negative.   Psychiatric/Behavioral:  The patient is nervous/anxious.      Objective:    Physical Exam Vitals and nursing note reviewed.  Constitutional:      General: She is not in  acute distress.    Appearance: Normal appearance.  HENT:     Head: Normocephalic and atraumatic.  Eyes:     Conjunctiva/sclera: Conjunctivae normal.  Cardiovascular:     Rate and Rhythm: Normal rate and regular rhythm.     Pulses: Normal pulses.     Heart sounds: Normal heart sounds.  Pulmonary:     Effort: Pulmonary effort is  normal.     Breath sounds: Normal breath sounds.  Musculoskeletal:        General: Tenderness (cervical spine, top of right shoulder) present. Normal range of motion.     Cervical back: Normal range of motion.     Comments: Pain with empty can test and spurling test  Skin:    General: Skin is warm and dry.  Neurological:     General: No focal deficit present.     Mental Status: She is alert and oriented to person, place, and time.  Psychiatric:        Mood and Affect: Mood normal.        Behavior: Behavior normal.        Thought Content: Thought content normal.        Judgment: Judgment normal.    BP (!) 134/91 (BP Location: Left Arm, Patient Position: Sitting)   Pulse 99   Temp 98.6 F (37 C) (Oral)   Resp 18   Wt 195 lb 3.2 oz (88.5 kg)   LMP 06/15/2021 (Approximate)   SpO2 98%   BMI 34.58 kg/m  Wt Readings from Last 3 Encounters:  06/22/21 195 lb 3.2 oz (88.5 kg)  05/07/21 180 lb (81.6 kg)  11/07/20 180 lb (81.6 kg)     Health Maintenance Due  Topic Date Due   Pneumococcal Vaccine 76-69 Years old (1 - PCV) Never done   COVID-19 Vaccine (2 - Pfizer risk series) 06/16/2020   INFLUENZA VACCINE  Never done    There are no preventive care reminders to display for this patient.  Lab Results  Component Value Date   TSH 7.630 (H) 05/07/2021   Lab Results  Component Value Date   WBC 7.4 05/07/2021   HGB 12.6 05/07/2021   HCT 37.4 05/07/2021   MCV 84 05/07/2021   PLT 218 05/07/2021   Lab Results  Component Value Date   NA 137 05/07/2021   K 3.8 05/07/2021   CO2 20 05/07/2021   GLUCOSE 59 (L) 05/07/2021   BUN 8 05/07/2021    CREATININE 0.58 05/07/2021   BILITOT <0.2 05/07/2021   ALKPHOS 72 05/07/2021   AST 14 05/07/2021   ALT 10 05/07/2021   PROT 7.4 05/07/2021   ALBUMIN 4.4 05/07/2021   CALCIUM 9.1 05/07/2021   ANIONGAP 9 08/11/2018   EGFR 120 05/07/2021   Lab Results  Component Value Date   CHOL 214 (H) 01/22/2020   Lab Results  Component Value Date   HDL 62 01/22/2020   Lab Results  Component Value Date   LDLCALC 118 (H) 01/22/2020   Lab Results  Component Value Date   TRIG 199 (H) 01/22/2020   No results found for: CHOLHDL No results found for: HGBA1C    Assessment & Plan:   Problem List Items Addressed This Visit       Cardiovascular and Mediastinum   Hypertension    Blood pressure today 134/91. Discussed starting blood pressure medication, however she is really hesitant. She would like to continue working on her diet and exercise to see if she can lower her blood pressure without medication. Continue checking blood pressure daily at home. Follow up in 2 months.         Other   Anxiety and depression    Chronic, ongoing. Depression has improved with adding welbutrin, however anxiety has slightly worsened and feels jittery. Will decrease wellbutrin back down to 113m daily. Continue zoloft 1058mdaily. Denies SI/HI. Follow up in 2 months or  sooner with concerns.       Relevant Medications   buPROPion (WELLBUTRIN XL) 150 MG 24 hr tablet   Elevated TSH    Recently started on levothyroxine 11mg daily with no side effects. Check TSH today and adjust regimen based on results.       Relevant Orders   TSH   Neck pain - Primary    Chronic, ongoing. Will check cervical spine x-ray today. History and exam consistent with impingement. Prednisone taper and tramadol prn to help with worsening pain. Continue stretches, ibuprofen, and flexeril prn. Can stop gabapentin if not noticing any benefits. PDMP reviewed. Will also place referral to orthopedics.       Relevant Orders   DG Cervical  Spine Complete   Ambulatory referral to Orthopedic Surgery    Meds ordered this encounter  Medications   predniSONE (DELTASONE) 10 MG tablet    Sig: Take 6 tablets today, then 5 tablets tomorrow, then decrease by 1 tablet every day until gone    Dispense:  21 tablet    Refill:  0   buPROPion (WELLBUTRIN XL) 150 MG 24 hr tablet    Sig: Take 1 tablet (150 mg total) by mouth daily.    Dispense:  90 tablet    Refill:  1   traMADol (ULTRAM) 50 MG tablet    Sig: Take 1 tablet (50 mg total) by mouth every 8 (eight) hours as needed for up to 5 days.    Dispense:  15 tablet    Refill:  0     Follow-up: Return in about 2 months (around 08/22/2021) for Blood pressure, anxiety/depression.    LCharyl Dancer NP

## 2021-06-22 NOTE — Assessment & Plan Note (Signed)
Recently started on levothyroxine daily with no side effects. Check TSH today and adjust regimen based on results.

## 2021-06-22 NOTE — Assessment & Plan Note (Signed)
Blood pressure today 134/91. Discussed starting blood pressure medication, however she is really hesitant. She would like to continue working on her diet and exercise to see if she can lower her blood pressure without medication. Continue checking blood pressure daily at home. Follow up in 2 months.

## 2021-06-22 NOTE — Assessment & Plan Note (Addendum)
Chronic, ongoing. Will check cervical spine x-ray today. History and exam consistent with impingement. Prednisone taper and tramadol prn to help with worsening pain. Continue stretches, ibuprofen, and flexeril prn. Can stop gabapentin if not noticing any benefits. PDMP reviewed. Will also place referral to orthopedics.

## 2021-06-22 NOTE — Assessment & Plan Note (Signed)
>>  ASSESSMENT AND PLAN FOR ELEVATED TSH WRITTEN ON 06/22/2021  1:36 PM BY MCELWEE, LAUREN A, NP  Recently started on levothyroxine daily with no side effects. Check TSH today and adjust regimen based on results.

## 2021-06-22 NOTE — Patient Instructions (Signed)
X-ray of your neck at [facility], you can walk in without an appointment Monday-Thursday Start prednisone daily to help with pain Continue flexeril, ibuprofen as needed for pain Tramadol as needed for pain We will check your thyroid and I will let you know your results tomorrow  Decrease wellbutrin back down to 150mg  daily Follow up in 2 months

## 2021-06-22 NOTE — Assessment & Plan Note (Signed)
Chronic, ongoing. Depression has improved with adding welbutrin, however anxiety has slightly worsened and feels jittery. Will decrease wellbutrin back down to 150mg  daily. Continue zoloft 100mg  daily. Denies SI/HI. Follow up in 2 months or sooner with concerns.

## 2021-06-23 LAB — TSH: TSH: 3.62 u[IU]/mL (ref 0.450–4.500)

## 2021-06-23 MED ORDER — LEVOTHYROXINE SODIUM 25 MCG PO TABS: 25.0000 ug | ORAL_TABLET | Freq: Every day | ORAL | 0 refills | Status: DC

## 2021-06-23 NOTE — Addendum Note (Signed)
Addended by: Rodman Pickle A on: 06/23/2021 08:10 AM   Modules accepted: Orders

## 2021-06-26 ENCOUNTER — Other Ambulatory Visit: Payer: Self-pay | Admitting: Nurse Practitioner

## 2021-06-26 MED ORDER — CYCLOBENZAPRINE HCL 10 MG PO TABS: 10.0000 mg | ORAL_TABLET | Freq: Three times a day (TID) | ORAL | 0 refills | Status: DC | PRN

## 2021-07-05 ENCOUNTER — Other Ambulatory Visit: Payer: Self-pay | Admitting: Nurse Practitioner

## 2021-07-05 NOTE — Telephone Encounter (Signed)
Last RF 06/23/21 #90 E-Prescribing Status: Receipt confirmed by pharmacy  Requested Prescriptions  Refused Prescriptions Disp Refills  . levothyroxine (SYNTHROID) 25 MCG tablet [Pharmacy Med Name: LEVOTHYROXINE 25 MCG TABLET] 30 tablet 1    Sig: TAKE 1 TABLET BY MOUTH EVERY DAY     Endocrinology:  Hypothyroid Agents Failed - 07/05/2021  2:32 PM      Failed - TSH needs to be rechecked within 3 months after an abnormal result. Refill until TSH is due.      Passed - TSH in normal range and within 360 days    TSH  Date Value Ref Range Status  06/22/2021 3.620 0.450 - 4.500 uIU/mL Final         Passed - Valid encounter within last 12 months    Recent Outpatient Visits          1 week ago Neck pain   [facility] McElwee, Lauren A, NP   1 month ago Anxiety and depression   [facility] McElwee, Lauren A, NP   10 months ago Fatigue, unspecified type   [facility] Caro Laroche, DO   1 year ago Annual physical exam   [facility] Valentino Nose, NP   1 year ago GAD (generalized anxiety disorder)   [facility] Valentino Nose, NP      Future Appointments            In 1 month Larae Grooms, NP [facility]

## 2021-08-21 NOTE — Progress Notes (Deleted)
Established Patient Office Visit  Subjective:  Patient ID: Maureen Hoffman, female    DOB: 01-25-1986  Age: 36 y.o. MRN: [id number]  CC:  No chief complaint on file.    HPI Maureen Hoffman presents for follow up on neck pain, elevated blood pressure, and depression/anxiety.    HYPERTENSION  She has been checking her blood pressure at home which has been averaging 150s/120s. She states that she does get nervious right before she checks her blood pressure because she is afraid it will be high. She denies chest pain and shortness of breath.   ANXIETY/DEPRESSION  States that she feels overwhelmed, which can make her irritable/snappy. She denies depression/feeling down/having little interest in things. She recently increased her wellbutrin to 348m daily which she feels like has made her more jittery and is not helping with her symptoms as much.   [facility] 06/22/2021 05/07/2021 01/22/2020 12/11/2019 11/13/2019  Decreased Interest 2 3 0 0 0  Down, Depressed, Hopeless 1 2 0 0 0  PHQ - 2 Score 3 5 0 0 0  Altered sleeping _0 Tired, decreased energy _1 Change in appetite _2 0 1  Feeling bad or failure about yourself  0 3 1 0 0  Trouble concentrating 0 1 0 0 1  Moving slowly or fidgety/restless 0 0 0 0 0  Suicidal thoughts 0 0 0 0 0  PHQ-9 Score _3 Difficult doing work/chores - - Somewhat difficult Somewhat difficult Somewhat difficult   GAD 7 : Generalized Anxiety Score 06/22/2021 05/07/2021 09/02/2020 01/22/2020  Nervous, Anxious, on Edge _4 Control/stop worrying 0 2 2 0  Worry too much - different things _5 Trouble relaxing _6 0  Restless _7 0  Easily annoyed or irritable _8 Afraid - awful might happen 0 1 0 0  Total GAD 7 Score _9 Anxiety Difficulty Very difficult Extremely difficult - Somewhat difficult    Past Medical History:  Diagnosis Date   Mood swings    Seizures (HCC)    as a child    [place]   [place]    [place]   Thyroid disease     Past Surgical History:  Procedure Laterality Date   NO PAST SURGERIES      Family History  Problem Relation Age of Onset   Healthy Mother    Colon cancer Father    Hashimoto's thyroiditis Sister    Breast cancer Maternal Grandmother    Diabetes Maternal Grandmother    Breast cancer Paternal Grandmother    COPD Paternal Grandmother    Brain cancer Paternal Grandfather     Social History   Socioeconomic History   Marital status: Single    Spouse name: Not on file   Number of children: Not on file   Years of education: Not on file   Highest education level: Not on file  Occupational History   Not on file  Tobacco Use   Smoking status: Former    Types: Cigarettes    Quit date: 07/13/2010    Years since quitting: 11.1   Smokeless tobacco: Never  Vaping Use   Vaping Use: Never used  Substance and Sexual Activity   Alcohol use: Yes    Comment: rare   Drug  use: No   Sexual activity: Yes    Birth control/protection: Pill  Other Topics Concern   Not on file  Social History Narrative   Not on file   Social Determinants of Health   Financial Resource Strain: Not on file  Food Insecurity: Not on file  Transportation Needs: Not on file  Physical Activity: Not on file  Stress: Not on file  Social Connections: Not on file  Intimate Partner Violence: Not on file    Outpatient Medications Prior to Visit  Medication Sig Dispense Refill   albuterol (PROAIR HFA) 108 (90 Base) MCG/ACT inhaler Inhale 1-2 puffs into the lungs every 6 (six) hours as needed for wheezing or shortness of breath. 8 g 0   buPROPion (WELLBUTRIN XL) 150 MG 24 hr tablet Take 1 tablet (150 mg total) by mouth daily. 90 tablet 1   busPIRone (BUSPAR) 7.5 MG tablet TAKE 1 TABLET (7.5 MG TOTAL) BY MOUTH 2 (TWO) TIMES DAILY AS NEEDED (ANXIETY, NERVOUSNESS). 180  tablet 1   cyclobenzaprine (FLEXERIL) 10 MG tablet Take 1 tablet (10 mg total) by mouth 3 (three) times daily as needed for muscle spasms. 30 tablet 0   fluticasone (FLONASE) 50 MCG/ACT nasal spray Place 2 sprays into both nostrils daily. 16 g 6   gabapentin (NEURONTIN) 300 MG capsule Take 1 capsule (300 mg total) by mouth 2 (two) times daily. Start taking 1 capsule at bedtime, if needed after 3 days can increase to twice a day 60 capsule 1   ipratropium (ATROVENT) 0.06 % nasal spray Place 2 sprays into both nostrils 4 (four) times daily. 15 mL 12   levothyroxine (SYNTHROID) 25 MCG tablet Take 1 tablet (25 mcg total) by mouth daily. 90 tablet 0   norgestimate-ethinyl estradiol (ORTHO-CYCLEN) 0.25-35 MG-MCG tablet Take 1 tablet by mouth daily. 84 tablet 3   predniSONE (DELTASONE) 10 MG tablet Take 6 tablets today, then 5 tablets tomorrow, then decrease by 1 tablet every day until gone 21 tablet 0   sertraline (ZOLOFT) 100 MG tablet Take 1 tablet (100 mg total) by mouth daily. Need appointment for future refills. 90 tablet 1   vitamin C (ASCORBIC ACID) 250 MG tablet Take 250 mg by mouth daily.     No facility-administered medications prior to visit.    No Known Allergies  ROS Review of Systems  Constitutional:  Positive for fatigue.  Respiratory: Negative.    Cardiovascular: Negative.   Gastrointestinal: Negative.   Genitourinary: Negative.   Musculoskeletal:  Positive for neck pain.  Skin: Negative.   Neurological: Negative.   Psychiatric/Behavioral:  The patient is nervous/anxious.      Objective:    Physical Exam Vitals and nursing note reviewed.  Constitutional:      General: She is not in acute distress.    Appearance: Normal appearance.  HENT:     Head: Normocephalic and atraumatic.  Eyes:     Conjunctiva/sclera: Conjunctivae normal.  Cardiovascular:     Rate and Rhythm: Normal rate and regular rhythm.     Pulses: Normal pulses.     Heart sounds: Normal heart sounds.   Pulmonary:     Effort: Pulmonary effort is normal.     Breath sounds: Normal breath sounds.  Musculoskeletal:        General: Tenderness (cervical spine, top of right shoulder) present. Normal range of motion.     Cervical back: Normal range of motion.     Comments: Pain with empty can test and spurling test  Skin:  General: Skin is warm and dry.  Neurological:     General: No focal deficit present.     Mental Status: She is alert and oriented to person, place, and time.  Psychiatric:        Mood and Affect: Mood normal.        Behavior: Behavior normal.        Thought Content: Thought content normal.        Judgment: Judgment normal.    There were no vitals taken for this visit. Wt Readings from Last 3 Encounters:  06/22/21 195 lb 3.2 oz (88.5 kg)  05/07/21 180 lb (81.6 kg)  11/07/20 180 lb (81.6 kg)     Health Maintenance Due  Topic Date Due   COVID-19 Vaccine (2 - Pfizer risk series) 06/16/2020   INFLUENZA VACCINE  Never done    There are no preventive care reminders to display for this patient.  Lab Results  Component Value Date   TSH 3.620 06/22/2021   Lab Results  Component Value Date   WBC 7.4 05/07/2021   HGB 12.6 05/07/2021   HCT 37.4 05/07/2021   MCV 84 05/07/2021   PLT 218 05/07/2021   Lab Results  Component Value Date   NA 137 05/07/2021   K 3.8 05/07/2021   CO2 20 05/07/2021   GLUCOSE 59 (L) 05/07/2021   BUN 8 05/07/2021   CREATININE 0.58 05/07/2021   BILITOT <0.2 05/07/2021   ALKPHOS 72 05/07/2021   AST 14 05/07/2021   ALT 10 05/07/2021   PROT 7.4 05/07/2021   ALBUMIN 4.4 05/07/2021   CALCIUM 9.1 05/07/2021   ANIONGAP 9 08/11/2018   EGFR 120 05/07/2021   Lab Results  Component Value Date   CHOL 214 (H) 01/22/2020   Lab Results  Component Value Date   HDL 62 01/22/2020   Lab Results  Component Value Date   LDLCALC 118 (H) 01/22/2020   Lab Results  Component Value Date   TRIG 199 (H) 01/22/2020   No results found for:  CHOLHDL No results found for: HGBA1C    Assessment & Plan:   Problem List Items Addressed This Visit       Cardiovascular and Mediastinum   Hypertension - Primary     Other   Anxiety and depression    No orders of the defined types were placed in this encounter.    Follow-up: No follow-ups on file.    Jon Billings, NP

## 2021-08-24 ENCOUNTER — Ambulatory Visit: Payer: Medicaid Other | Admitting: Nurse Practitioner

## 2021-08-24 DIAGNOSIS — I10 Essential (primary) hypertension: Secondary | ICD-10-CM

## 2021-08-24 DIAGNOSIS — F419 Anxiety disorder, unspecified: Secondary | ICD-10-CM

## 2021-09-01 ENCOUNTER — Other Ambulatory Visit: Payer: Self-pay | Admitting: Nurse Practitioner

## 2021-09-02 NOTE — Telephone Encounter (Signed)
Requested medication (s) are due for refill today: yes  Requested medication (s) are on the active medication list: yes  Last refill:  09/18/20 67ml with 6 RF  Future visit scheduled: no, was asked to return in Jan/Feb  Notes to clinic:  This medication can not be delegated, please assess.    Requested Prescriptions  Pending Prescriptions Disp Refills   fluticasone (FLONASE) 50 MCG/ACT nasal spray [Pharmacy Med Name: FLUTICASONE PROP 50 MCG SPRAY] 16 mL 6    Sig: SPRAY 2 SPRAYS INTO EACH NOSTRIL EVERY DAY     Not Delegated - Ear, Nose, and Throat: Nasal Preparations - Corticosteroids Failed - 09/01/2021  6:08 PM      Failed - This refill cannot be delegated      Passed - Valid encounter within last 12 months    Recent Outpatient Visits           2 months ago Neck pain   [facility] McElwee, Lauren A, NP   3 months ago Anxiety and depression   [facility] McElwee, Lauren A, NP   1 year ago Fatigue, unspecified type   [facility] Caro Laroche, DO   1 year ago Annual physical exam   [facility] Valentino Nose, NP   1 year ago GAD (generalized anxiety disorder)   [facility] Valentino Nose, NP

## 2021-09-10 ENCOUNTER — Other Ambulatory Visit: Payer: Self-pay

## 2021-09-10 ENCOUNTER — Ambulatory Visit (INDEPENDENT_AMBULATORY_CARE_PROVIDER_SITE_OTHER): Payer: Medicaid Other | Admitting: Nurse Practitioner

## 2021-09-10 ENCOUNTER — Encounter: Payer: Self-pay | Admitting: Nurse Practitioner

## 2021-09-10 VITALS — BP 125/85 | HR 91 | Temp 99.3°F | Ht 62.99 in | Wt 189.4 lb

## 2021-09-10 DIAGNOSIS — F32A Depression, unspecified: Secondary | ICD-10-CM | POA: Diagnosis not present

## 2021-09-10 DIAGNOSIS — I10 Essential (primary) hypertension: Secondary | ICD-10-CM | POA: Diagnosis not present

## 2021-09-10 DIAGNOSIS — R7989 Other specified abnormal findings of blood chemistry: Secondary | ICD-10-CM

## 2021-09-10 DIAGNOSIS — F419 Anxiety disorder, unspecified: Secondary | ICD-10-CM | POA: Diagnosis not present

## 2021-09-10 MED ORDER — VENLAFAXINE HCL ER 37.5 MG PO CP24: 37.5000 mg | ORAL_CAPSULE | Freq: Every day | ORAL | 0 refills | Status: DC

## 2021-09-10 MED ORDER — NORGESTIMATE-ETH ESTRADIOL 0.25-35 MG-MCG PO TABS: 1.0000 | ORAL_TABLET | Freq: Every day | ORAL | 3 refills | Status: DC

## 2021-09-10 MED ORDER — FLUTICASONE PROPIONATE 50 MCG/ACT NA SUSP: 2.0000 | Freq: Every day | NASAL | 6 refills | Status: DC

## 2021-09-10 NOTE — Progress Notes (Signed)
Established Patient Office Visit  Subjective:  Patient ID: Maureen Hoffman, female    DOB: [birth year]  Age: 36 y.o. MRN: [id number]  CC:  No chief complaint on file.    HPI Tyrisha Benninger Offutt presents for follow up on neck pain, elevated blood pressure, and depression/anxiety.    HYPERTENSION Hypertension status: controlled  Satisfied with current treatment? no Duration of hypertension: years BP monitoring frequency:  daily BP range: 125/80 BP medication side effects:  no Medication compliance:  NA Previous BP meds:none Aspirin: no Recurrent headaches: no Visual changes: no Palpitations: no Dyspnea: no Chest pain: no Lower extremity edema: no Dizzy/lightheaded: no   ANXIETY/DEPRESSION Patient states she has been feeling "really out of whack".  She feels like the Zoloft isn't working as well as it did before.  States she is sweating a lot.  She states the Buspar doesn't really help her. She is currently dealing with a lot personally.    Depression screen [facility] 2/9 09/10/2021 06/22/2021 05/07/2021 01/22/2020 12/11/2019  Decreased Interest '3 2 3 ' 0 0  Down, Depressed, Hopeless '3 1 2 ' 0 0  PHQ - 2 Score '6 3 5 ' 0 0  Altered sleeping '3 1 2 3 2  ' Tired, decreased energy '3 3 3 1 1  ' Change in appetite '3 2 3 2 ' 0  Feeling bad or failure about yourself  3 0 3 1 0  Trouble concentrating 1 0 1 0 0  Moving slowly or fidgety/restless 0 0 0 0 0  Suicidal thoughts 0 0 0 0 0  PHQ-9 Score '19 9 17 7 3  ' Difficult doing work/chores Very difficult - - Somewhat difficult Somewhat difficult   GAD 7 : Generalized Anxiety Score 09/10/2021 06/22/2021 05/07/2021 09/02/2020  Nervous, Anxious, on Edge '3 3 1 3  ' Control/stop worrying 2 0 2 2  Worry too much - different things '3 1 1 3  ' Trouble relaxing '3 3 3 3  ' Restless '3 1 1 2  ' Easily annoyed or irritable '3 3 3 1  ' Afraid - awful might happen 1 0 1 0  Total GAD 7 Score '18 11 12 14  ' Anxiety Difficulty Extremely difficult Very difficult Extremely  difficult -    Past Medical History:  Diagnosis Date   Mood swings    Seizures (HCC)    as a child   Thrombocytopenia affecting pregnancy, antepartum ([place]) 12/03/2016   Thrombocytopenia complicating pregnancy ([place])    2018   Thyroid disease     Past Surgical History:  Procedure Laterality Date   NO PAST SURGERIES      Family History  Problem Relation Age of Onset   Healthy Mother    Colon cancer Father    Hashimoto's thyroiditis Sister    Breast cancer Maternal Grandmother    Diabetes Maternal Grandmother    Breast cancer Paternal Grandmother    COPD Paternal Grandmother    Brain cancer Paternal Grandfather     Social History   Socioeconomic History   Marital status: Single    Spouse name: Not on file   Number of children: Not on file   Years of education: Not on file   Highest education level: Not on file  Occupational History   Not on file  Tobacco Use   Smoking status: Former    Types: Cigarettes    Quit date: 07/13/2010    Years since quitting: 11.1   Smokeless tobacco: Never  Vaping Use   Vaping Use: Never used  Substance and Sexual  Activity   Alcohol use: Yes    Comment: rare   Drug use: No   Sexual activity: Yes    Birth control/protection: Pill  Other Topics Concern   Not on file  Social History Narrative   Not on file   Social Determinants of Health   Financial Resource Strain: Not on file  Food Insecurity: Not on file  Transportation Needs: Not on file  Physical Activity: Not on file  Stress: Not on file  Social Connections: Not on file  Intimate Partner Violence: Not on file    Outpatient Medications Prior to Visit  Medication Sig Dispense Refill   levothyroxine (SYNTHROID) 25 MCG tablet Take 1 tablet (25 mcg total) by mouth daily. 90 tablet 0   norgestimate-ethinyl estradiol (ORTHO-CYCLEN) 0.25-35 MG-MCG tablet Take 1 tablet by mouth daily. 84 tablet 3   vitamin C (ASCORBIC ACID) 250 MG tablet Take 250 mg by mouth daily.      sertraline (ZOLOFT) 100 MG tablet Take 1 tablet (100 mg total) by mouth daily. Need appointment for future refills. 90 tablet 1   buPROPion (WELLBUTRIN XL) 150 MG 24 hr tablet Take 1 tablet (150 mg total) by mouth daily. 90 tablet 1   busPIRone (BUSPAR) 7.5 MG tablet TAKE 1 TABLET (7.5 MG TOTAL) BY MOUTH 2 (TWO) TIMES DAILY AS NEEDED (ANXIETY, NERVOUSNESS). 180 tablet 1   cyclobenzaprine (FLEXERIL) 10 MG tablet Take 1 tablet (10 mg total) by mouth 3 (three) times daily as needed for muscle spasms. 30 tablet 0   fluticasone (FLONASE) 50 MCG/ACT nasal spray Place 2 sprays into both nostrils daily. 16 g 6   gabapentin (NEURONTIN) 300 MG capsule Take 1 capsule (300 mg total) by mouth 2 (two) times daily. Start taking 1 capsule at bedtime, if needed after 3 days can increase to twice a day 60 capsule 1   albuterol (PROAIR HFA) 108 (90 Base) MCG/ACT inhaler Inhale 1-2 puffs into the lungs every 6 (six) hours as needed for wheezing or shortness of breath. 8 g 0   ipratropium (ATROVENT) 0.06 % nasal spray Place 2 sprays into both nostrils 4 (four) times daily. 15 mL 12   predniSONE (DELTASONE) 10 MG tablet Take 6 tablets today, then 5 tablets tomorrow, then decrease by 1 tablet every day until gone 21 tablet 0   No facility-administered medications prior to visit.    No Known Allergies  ROS Review of Systems  Respiratory: Negative.    Cardiovascular: Negative.   Gastrointestinal: Negative.   Endocrine: Positive for heat intolerance.  Genitourinary: Negative.   Skin: Negative.   Neurological: Negative.   Psychiatric/Behavioral:  Positive for dysphoric mood. The patient is nervous/anxious.      Objective:    Physical Exam Vitals and nursing note reviewed.  Constitutional:      General: She is not in acute distress.    Appearance: Normal appearance.  HENT:     Head: Normocephalic and atraumatic.  Eyes:     Conjunctiva/sclera: Conjunctivae normal.  Cardiovascular:     Rate and Rhythm:  Normal rate and regular rhythm.     Pulses: Normal pulses.     Heart sounds: Normal heart sounds.  Pulmonary:     Effort: Pulmonary effort is normal.     Breath sounds: Normal breath sounds.  Musculoskeletal:        General: Tenderness: cervical spine, top of right shoulder. Normal range of motion.     Cervical back: Normal range of motion.     Comments:  Pain with empty can test and spurling test  Skin:    General: Skin is warm and dry.  Neurological:     General: No focal deficit present.     Mental Status: She is alert and oriented to person, place, and time.  Psychiatric:        Mood and Affect: Mood normal.        Behavior: Behavior normal.        Thought Content: Thought content normal.        Judgment: Judgment normal.    BP 125/85 Comment: home blood pressure   Pulse 91    Temp 99.3 F (37.4 C) (Oral)    Ht 5' 2.99" (1.6 m)    Wt 189 lb 6.4 oz (85.9 kg)    SpO2 99%    BMI 33.56 kg/m  Wt Readings from Last 3 Encounters:  09/10/21 189 lb 6.4 oz (85.9 kg)  06/22/21 195 lb 3.2 oz (88.5 kg)  05/07/21 180 lb (81.6 kg)     There are no preventive care reminders to display for this patient.   There are no preventive care reminders to display for this patient.  Lab Results  Component Value Date   TSH 3.620 06/22/2021   Lab Results  Component Value Date   WBC 7.4 05/07/2021   HGB 12.6 05/07/2021   HCT 37.4 05/07/2021   MCV 84 05/07/2021   PLT 218 05/07/2021   Lab Results  Component Value Date   NA 137 05/07/2021   K 3.8 05/07/2021   CO2 20 05/07/2021   GLUCOSE 59 (L) 05/07/2021   BUN 8 05/07/2021   CREATININE 0.58 05/07/2021   BILITOT <0.2 05/07/2021   ALKPHOS 72 05/07/2021   AST 14 05/07/2021   ALT 10 05/07/2021   PROT 7.4 05/07/2021   ALBUMIN 4.4 05/07/2021   CALCIUM 9.1 05/07/2021   ANIONGAP 9 08/11/2018   EGFR 120 05/07/2021   Lab Results  Component Value Date   CHOL 214 (H) 01/22/2020   Lab Results  Component Value Date   HDL 62 01/22/2020    Lab Results  Component Value Date   LDLCALC 118 (H) 01/22/2020   Lab Results  Component Value Date   TRIG 199 (H) 01/22/2020   No results found for: CHOLHDL No results found for: HGBA1C    Assessment & Plan:   Problem List Items Addressed This Visit       Cardiovascular and Mediastinum   Hypertension    Chronic.  Controlled without medication.  Has been checking blood pressures at home and they are running 120s/80s.  Labs ordered today.  Return to clinic in 6 months for reevaluation.  Call sooner if concerns arise.        Relevant Orders   Comp Met (CMET)     Other   Anxiety and depression - Primary    Chronic.  Uncontrolled.  Continue with Wellbutrin 154m daily.  Will stop Zoloft and start Effexor.  Discussed how to taper Zoloft off.  Discussed side effects and benefits of medication with patient during visit.  Labs ordered today.  Return to clinic in 1 months for reevaluation.  Call sooner if concerns arise.        Relevant Medications   venlafaxine XR (EFFEXOR XR) 37.5 MG 24 hr capsule   Other Relevant Orders   Comp Met (CMET)   Elevated TSH    Labs ordered today. Will make recommendations based on lab results.  Continue with Levothyroxine 249m daily.  Relevant Orders   TSH   T4, free    Meds ordered this encounter  Medications   venlafaxine XR (EFFEXOR XR) 37.5 MG 24 hr capsule    Sig: Take 1 capsule (37.5 mg total) by mouth daily with breakfast.    Dispense:  30 capsule    Refill:  0    Order Specific Question:   Supervising Provider    Answer:   Valerie Roys [[id number]]     Follow-up: Return in about 1 month (around 10/08/2021) for Depression/Anxiety FU.    Jon Billings, NP

## 2021-09-10 NOTE — Assessment & Plan Note (Signed)
>>  ASSESSMENT AND PLAN FOR ELEVATED TSH WRITTEN ON 09/10/2021  2:05 PM BY HOLDSWORTH, KAREN, NP  Labs ordered today. Will make recommendations based on lab results.  Continue with Levothyroxine daily.

## 2021-09-10 NOTE — Assessment & Plan Note (Signed)
Labs ordered today. Will make recommendations based on lab results.  Continue with Levothyroxine daily.

## 2021-09-10 NOTE — Assessment & Plan Note (Signed)
Chronic.  Uncontrolled.  Continue with Wellbutrin 150mg  daily.  Will stop Zoloft and start Effexor.  Discussed how to taper Zoloft off.  Discussed side effects and benefits of medication with patient during visit.  Labs ordered today.  Return to clinic in 1 months for reevaluation.  Call sooner if concerns arise.

## 2021-09-10 NOTE — Assessment & Plan Note (Signed)
Chronic.  Controlled without medication.  Has been checking blood pressures at home and they are running 120s/80s.  Labs ordered today.  Return to clinic in 6 months for reevaluation.  Call sooner if concerns arise.

## 2021-09-11 LAB — COMPREHENSIVE METABOLIC PANEL
ALT: 16 IU/L (ref 0–32)
AST: 22 IU/L (ref 0–40)
Albumin/Globulin Ratio: 1.5 (ref 1.2–2.2)
Albumin: 4.7 g/dL (ref 3.8–4.8)
Alkaline Phosphatase: 90 IU/L (ref 44–121)
BUN/Creatinine Ratio: 11 (ref 9–23)
BUN: 9 mg/dL (ref 6–20)
Bilirubin Total: 0.4 mg/dL (ref 0.0–1.2)
CO2: 20 mmol/L (ref 20–29)
Calcium: 9.5 mg/dL (ref 8.7–10.2)
Chloride: 100 mmol/L (ref 96–106)
Creatinine, Ser: 0.84 mg/dL (ref 0.57–1.00)
Globulin, Total: 3.1 g/dL (ref 1.5–4.5)
Glucose: 77 mg/dL (ref 70–99)
Potassium: 4 mmol/L (ref 3.5–5.2)
Sodium: 139 mmol/L (ref 134–144)
Total Protein: 7.8 g/dL (ref 6.0–8.5)
eGFR: 92 mL/min/{1.73_m2} (ref >59)

## 2021-09-11 LAB — TSH: TSH: 4.24 u[IU]/mL (ref 0.450–4.500)

## 2021-09-11 LAB — T4, FREE: Free T4: 1.29 ng/dL (ref 0.82–1.77)

## 2021-09-11 NOTE — Progress Notes (Signed)
Hi Maureen Hoffman.  Your lab work looks good.  No concerns at this time.  Follow up as discussed.

## 2021-10-04 ENCOUNTER — Other Ambulatory Visit: Payer: Self-pay | Admitting: Nurse Practitioner

## 2021-10-05 NOTE — Telephone Encounter (Signed)
Requested medication (s) are due for refill today - yes ? ?Requested medication (s) are on the active medication list -yes ? ?Future visit scheduled -yes ? ?Last refill: 09/10/21 ? ?Notes to clinic: Request RF: Rx RF request for 90 days- new start medication- appointment in 3 days ? ?Requested Prescriptions  ?Pending Prescriptions Disp Refills  ? venlafaxine XR (EFFEXOR-XR) 37.5 MG 24 hr capsule [Pharmacy Med Name: VENLAFAXINE HCL ER 37.5 MG CAP] 90 capsule 1  ?  Sig: TAKE 1 CAPSULE BY MOUTH DAILY WITH BREAKFAST.  ?  ? Psychiatry: Antidepressants - SNRI - desvenlafaxine & venlafaxine Failed - 10/04/2021 12:30 PM  ?  ?  Failed - Lipid Panel in normal range within the last 12 months  ?  Cholesterol, Total  ?Date Value Ref Range Status  ?01/22/2020 214 (H) 100 - 199 mg/dL Final  ? ?LDL Chol Calc (NIH)  ?Date Value Ref Range Status  ?01/22/2020 118 (H) 0 - 99 mg/dL Final  ? ?HDL  ?Date Value Ref Range Status  ?01/22/2020 62 >39 mg/dL Final  ? ?Triglycerides  ?Date Value Ref Range Status  ?01/22/2020 199 (H) 0 - 149 mg/dL Final  ? ?  ?  ?  Passed - Cr in normal range and within 360 days  ?  Creatinine  ?Date Value Ref Range Status  ?11/02/2013 0.59 (L) 0.60 - 1.30 mg/dL Final  ? ?Creatinine, Ser  ?Date Value Ref Range Status  ?09/10/2021 0.84 0.57 - 1.00 mg/dL Final  ?  ?  ?  ?  Passed - Completed PHQ-2 or PHQ-9 in the last 360 days  ?  ?  Passed - Last BP in normal range  ?  BP Readings from Last 1 Encounters:  ?09/10/21 125/85  ?  ?  ?  ?  Passed - Valid encounter within last 6 months  ?  Recent Outpatient Visits   ? ?      ? 3 weeks ago Anxiety and depression  ? [facility] Larae Grooms, NP  ? 3 months ago Neck pain  ? [facility] McElwee, Lauren A, NP  ? 5 months ago Anxiety and depression  ? [facility] McElwee, Lauren A, NP  ? 1 year ago Fatigue, unspecified type  ? [facility] Caro Laroche, DO  ? 1 year ago Annual physical exam  ? [facility]  Valentino Nose, NP  ? ?  ?  ?Future Appointments   ? ?        ? In 3 days Larae Grooms, NP [facility], [facility]  ? ?  ? ?  ?  ?  ? ? ? ?Requested Prescriptions  ?Pending Prescriptions Disp Refills  ? venlafaxine XR (EFFEXOR-XR) 37.5 MG 24 hr capsule [Pharmacy Med Name: VENLAFAXINE HCL ER 37.5 MG CAP] 90 capsule 1  ?  Sig: TAKE 1 CAPSULE BY MOUTH DAILY WITH BREAKFAST.  ?  ? Psychiatry: Antidepressants - SNRI - desvenlafaxine & venlafaxine Failed - 10/04/2021 12:30 PM  ?  ?  Failed - Lipid Panel in normal range within the last 12 months  ?  Cholesterol, Total  ?Date Value Ref Range Status  ?01/22/2020 214 (H) 100 - 199 mg/dL Final  ? ?LDL Chol Calc (NIH)  ?Date Value Ref Range Status  ?01/22/2020 118 (H) 0 - 99 mg/dL Final  ? ?HDL  ?Date Value Ref Range Status  ?01/22/2020 62 >39 mg/dL Final  ? ?Triglycerides  ?Date Value Ref Range Status  ?01/22/2020 199 (  H) 0 - 149 mg/dL Final  ? ?  ?  ?  Passed - Cr in normal range and within 360 days  ?  Creatinine  ?Date Value Ref Range Status  ?11/02/2013 0.59 (L) 0.60 - 1.30 mg/dL Final  ? ?Creatinine, Ser  ?Date Value Ref Range Status  ?09/10/2021 0.84 0.57 - 1.00 mg/dL Final  ?  ?  ?  ?  Passed - Completed PHQ-2 or PHQ-9 in the last 360 days  ?  ?  Passed - Last BP in normal range  ?  BP Readings from Last 1 Encounters:  ?09/10/21 125/85  ?  ?  ?  ?  Passed - Valid encounter within last 6 months  ?  Recent Outpatient Visits   ? ?      ? 3 weeks ago Anxiety and depression  ? [facility] Larae Grooms, NP  ? 3 months ago Neck pain  ? [facility] McElwee, Lauren A, NP  ? 5 months ago Anxiety and depression  ? [facility] McElwee, Lauren A, NP  ? 1 year ago Fatigue, unspecified type  ? [facility] Caro Laroche, DO  ? 1 year ago Annual physical exam  ? [facility] Valentino Nose, NP  ? ?  ?  ?Future Appointments   ? ?        ? In 3 days Larae Grooms, NP [facility],  [facility]  ? ?  ? ?  ?  ?  ? ? ? ?

## 2021-10-08 ENCOUNTER — Ambulatory Visit: Payer: Medicaid Other | Admitting: Nurse Practitioner

## 2021-10-12 ENCOUNTER — Ambulatory Visit: Payer: Medicaid Other | Admitting: Nurse Practitioner

## 2021-10-12 NOTE — Progress Notes (Deleted)
? ?Established Patient Office Visit ? ?Subjective:  ?Patient ID: Maureen Hoffman, female    DOB: [birth year]  Age: 36 y.o. MRN: [id number] ? ?CC:  ?No chief complaint on file. ? ? ? ?HPI ?Maureen Hoffman presents for follow up on neck pain, elevated blood pressure, and depression/anxiety.  ? ? ?ANXIETY/DEPRESSION ?Patient states she has been feeling "really out of whack".  She feels like the Zoloft isn't working as well as it did before.  States she is sweating a lot.  She states the Buspar doesn't really help her. She is currently dealing with a lot personally.  ? ? ?Depression screen [facility] 2/9 09/10/2021 06/22/2021 05/07/2021 01/22/2020 12/11/2019  ?Decreased Interest '3 2 3 ' 0 0  ?Down, Depressed, Hopeless '3 1 2 ' 0 0  ?PHQ - 2 Score '6 3 5 ' 0 0  ?Altered sleeping '3 1 2 3 2  ' ?Tired, decreased energy '3 3 3 1 1  ' ?Change in appetite '3 2 3 2 ' 0  ?Feeling bad or failure about yourself  3 0 3 1 0  ?Trouble concentrating 1 0 1 0 0  ?Moving slowly or fidgety/restless 0 0 0 0 0  ?Suicidal thoughts 0 0 0 0 0  ?PHQ-9 Score '19 9 17 7 3  ' ?Difficult doing work/chores Very difficult - - Somewhat difficult Somewhat difficult  ? ?GAD 7 : Generalized Anxiety Score 09/10/2021 06/22/2021 05/07/2021 09/02/2020  ?Nervous, Anxious, on Edge '3 3 1 3  ' ?Control/stop worrying 2 0 2 2  ?Worry too much - different things '3 1 1 3  ' ?Trouble relaxing '3 3 3 3  ' ?Restless '3 1 1 2  ' ?Easily annoyed or irritable '3 3 3 1  ' ?Afraid - awful might happen 1 0 1 0  ?Total GAD 7 Score '18 11 12 14  ' ?Anxiety Difficulty Extremely difficult Very difficult Extremely difficult -  ? ? ?Past Medical History:  ?Diagnosis Date  ? Mood swings   ? Seizures ([place])   ? as a child  ? Thrombocytopenia affecting pregnancy, antepartum ([place]) 12/03/2016  ? Thrombocytopenia complicating pregnancy ([place])   ? 2018  ? Thyroid disease   ? ? ?Past Surgical History:  ?Procedure Laterality Date  ? NO PAST SURGERIES    ? ? ?Family History  ?Problem Relation Age of Onset  ? Healthy Mother   ? Colon cancer  Father   ? Hashimoto's thyroiditis Sister   ? Breast cancer Maternal Grandmother   ? Diabetes Maternal Grandmother   ? Breast cancer Paternal Grandmother   ? COPD Paternal Grandmother   ? Brain cancer Paternal Grandfather   ? ? ?Social History  ? ?Socioeconomic History  ? Marital status: Single  ?  Spouse name: Not on file  ? Number of children: Not on file  ? Years of education: Not on file  ? Highest education level: Not on file  ?Occupational History  ? Not on file  ?Tobacco Use  ? Smoking status: Former  ?  Types: Cigarettes  ?  Quit date: 07/13/2010  ?  Years since quitting: 11.2  ? Smokeless tobacco: Never  ?Vaping Use  ? Vaping Use: Never used  ?Substance and Sexual Activity  ? Alcohol use: Yes  ?  Comment: rare  ? Drug use: No  ? Sexual activity: Yes  ?  Birth control/protection: Pill  ?Other Topics Concern  ? Not on file  ?Social History Narrative  ? Not on file  ? ?Social Determinants of Health  ? ?Financial Resource Strain: Not on  file  ?Food Insecurity: Not on file  ?Transportation Needs: Not on file  ?Physical Activity: Not on file  ?Stress: Not on file  ?Social Connections: Not on file  ?Intimate Partner Violence: Not on file  ? ? ?Outpatient Medications Prior to Visit  ?Medication Sig Dispense Refill  ? buPROPion (WELLBUTRIN XL) 150 MG 24 hr tablet Take 1 tablet (150 mg total) by mouth daily. 90 tablet 1  ? busPIRone (BUSPAR) 7.5 MG tablet TAKE 1 TABLET (7.5 MG TOTAL) BY MOUTH 2 (TWO) TIMES DAILY AS NEEDED (ANXIETY, NERVOUSNESS). 180 tablet 1  ? cyclobenzaprine (FLEXERIL) 10 MG tablet Take 1 tablet (10 mg total) by mouth 3 (three) times daily as needed for muscle spasms. 30 tablet 0  ? fluticasone (FLONASE) 50 MCG/ACT nasal spray Place 2 sprays into both nostrils daily. 16 g 6  ? gabapentin (NEURONTIN) 300 MG capsule Take 1 capsule (300 mg total) by mouth 2 (two) times daily. Start taking 1 capsule at bedtime, if needed after 3 days can increase to twice a day 60 capsule 1  ? levothyroxine (SYNTHROID)  25 MCG tablet Take 1 tablet (25 mcg total) by mouth daily. 90 tablet 0  ? norgestimate-ethinyl estradiol (ORTHO-CYCLEN) 0.25-35 MG-MCG tablet Take 1 tablet by mouth daily. 84 tablet 3  ? venlafaxine XR (EFFEXOR XR) 37.5 MG 24 hr capsule Take 1 capsule (37.5 mg total) by mouth daily with breakfast. 30 capsule 0  ? vitamin C (ASCORBIC ACID) 250 MG tablet Take 250 mg by mouth daily.    ? ?No facility-administered medications prior to visit.  ? ? ?No Known Allergies ? ?ROS ?Review of Systems  ?Respiratory: Negative.    ?Cardiovascular: Negative.   ?Gastrointestinal: Negative.   ?Endocrine: Positive for heat intolerance.  ?Genitourinary: Negative.   ?Skin: Negative.   ?Neurological: Negative.   ?Psychiatric/Behavioral:  Positive for dysphoric mood. The patient is nervous/anxious.   ? ?  ?Objective:  ?  ?Physical Exam ?Vitals and nursing note reviewed.  ?Constitutional:   ?   General: She is not in acute distress. ?   Appearance: Normal appearance.  ?HENT:  ?   Head: Normocephalic and atraumatic.  ?Eyes:  ?   Conjunctiva/sclera: Conjunctivae normal.  ?Cardiovascular:  ?   Rate and Rhythm: Normal rate and regular rhythm.  ?   Pulses: Normal pulses.  ?   Heart sounds: Normal heart sounds.  ?Pulmonary:  ?   Effort: Pulmonary effort is normal.  ?   Breath sounds: Normal breath sounds.  ?Musculoskeletal:     ?   General: Tenderness: cervical spine, top of right shoulder. Normal range of motion.  ?   Cervical back: Normal range of motion.  ?   Comments: Pain with empty can test and spurling test  ?Skin: ?   General: Skin is warm and dry.  ?Neurological:  ?   General: No focal deficit present.  ?   Mental Status: She is alert and oriented to person, place, and time.  ?Psychiatric:     ?   Mood and Affect: Mood normal.     ?   Behavior: Behavior normal.     ?   Thought Content: Thought content normal.     ?   Judgment: Judgment normal.  ? ? ?There were no vitals taken for this visit. ?Wt Readings from Last 3 Encounters:   ?09/10/21 189 lb 6.4 oz (85.9 kg)  ?06/22/21 195 lb 3.2 oz (88.5 kg)  ?05/07/21 180 lb (81.6 kg)  ? ? ? ?Health  Maintenance Due  ?Topic Date Due  ? COVID-19 Vaccine (2 - Pfizer risk series) 06/16/2020  ? ? ? ?There are no preventive care reminders to display for this patient. ? ?Lab Results  ?Component Value Date  ? TSH 4.240 09/10/2021  ? ?Lab Results  ?Component Value Date  ? WBC 7.4 05/07/2021  ? HGB 12.6 05/07/2021  ? HCT 37.4 05/07/2021  ? MCV 84 05/07/2021  ? PLT 218 05/07/2021  ? ?Lab Results  ?Component Value Date  ? NA 139 09/10/2021  ? K 4.0 09/10/2021  ? CO2 20 09/10/2021  ? GLUCOSE 77 09/10/2021  ? BUN 9 09/10/2021  ? CREATININE 0.84 09/10/2021  ? BILITOT 0.4 09/10/2021  ? ALKPHOS 90 09/10/2021  ? AST 22 09/10/2021  ? ALT 16 09/10/2021  ? PROT 7.8 09/10/2021  ? ALBUMIN 4.7 09/10/2021  ? CALCIUM 9.5 09/10/2021  ? ANIONGAP 9 08/11/2018  ? EGFR 92 09/10/2021  ? ?Lab Results  ?Component Value Date  ? CHOL 214 (H) 01/22/2020  ? ?Lab Results  ?Component Value Date  ? HDL 62 01/22/2020  ? ?Lab Results  ?Component Value Date  ? [place] 118 (H) 01/22/2020  ? ?Lab Results  ?Component Value Date  ? TRIG 199 (H) 01/22/2020  ? ?No results found for: CHOLHDL ?No results found for: HGBA1C ? ?  ?Assessment & Plan:  ? ?Problem List Items Addressed This Visit   ? ?  ? Other  ? Anxiety and depression - Primary  ? ? ?No orders of the defined types were placed in this encounter. ? ? ? ?Follow-up: No follow-ups on file.  ? ? ?Jon Billings, NP ?

## 2021-10-23 ENCOUNTER — Other Ambulatory Visit: Payer: Self-pay | Admitting: Nurse Practitioner

## 2021-10-23 NOTE — Telephone Encounter (Signed)
Medication refill appt scheduled for 10/29/21 ? ? levothyroxine (SYNTHROID) 25 MCG tablet [Lauren A McElwee] ?  ?Preferred pharmacy: [id number] - [place], [place] - [address] ?Delivery method: Pickup ?

## 2021-10-23 NOTE — Telephone Encounter (Signed)
Last seen 09/10/21 ? ?Up coming visit none noted ?

## 2021-10-26 MED ORDER — LEVOTHYROXINE SODIUM 25 MCG PO TABS: 25.0000 ug | ORAL_TABLET | Freq: Every day | ORAL | 0 refills | Status: DC

## 2021-10-26 NOTE — Telephone Encounter (Signed)
Tried calling patient to let her know that her medication was sent in. No answer and no VM.  ? ?OK for [facility] to let patient know that medication was sent in if she calls back.  ?

## 2021-10-26 NOTE — Telephone Encounter (Signed)
Patient scheduled an appointment for 10/29/21, last TSH levels in February were normal. Can refill be sent in for this? ?

## 2021-10-26 NOTE — Addendum Note (Signed)
Addended by: Pablo Ledger on: 10/26/2021 09:52 AM ? ? Modules accepted: Orders ? ?

## 2021-10-28 NOTE — Progress Notes (Signed)
? ?Established Patient Office Visit ? ?Subjective:  ?Patient ID: Maureen Hoffman, female    DOB: [birth year]  Age: 36 y.o. MRN: [id number] ? ?CC:  ?Chief Complaint  ?Patient presents with  ? Hypothyroidism  ?  Follow up - states not feeling well. States fatigued   ? Hyperlipidemia  ? ? ? ?HPI ? ? ?ANXIETY/DEPRESSION ?Patient states she the new anxiety medication that she took did not help her anxiety.  It felt like she wasn't even taking anything.  States she took it for about 3 weeks then stopped taking it.  She is still taking the Wellbutrin 162m daily.  Patient states she doesn't take the Buspar very often because it makes her very sleepy.  Patient states she would like to go back on the Zoloft.   ? ? ? ?  10/29/2021  ?  9:52 AM 09/10/2021  ?  2:11 PM 06/22/2021  ?  1:25 PM 05/07/2021  ?  3:18 PM 01/22/2020  ? 10:43 AM  ?Depression screen PHQ 2/9  ?Decreased Interest '2 3 2 3 ' 0  ?Down, Depressed, Hopeless '3 3 1 2 ' 0  ?PHQ - 2 Score '5 6 3 5 ' 0  ?Altered sleeping '3 3 1 2 3  ' ?Tired, decreased energy '3 3 3 3 1  ' ?Change in appetite '3 3 2 3 2  ' ?Feeling bad or failure about yourself  3 3 0 3 1  ?Trouble concentrating 3 1 0 1 0  ?Moving slowly or fidgety/restless 1 0 0 0 0  ?Suicidal thoughts 0 0 0 0 0  ?PHQ-9 Score '21 19 9 17 7  ' ?Difficult doing work/chores Extremely dIfficult Very difficult   Somewhat difficult  ? ? ?  10/29/2021  ?  9:53 AM 09/10/2021  ?  2:11 PM 06/22/2021  ?  1:25 PM 05/07/2021  ?  3:19 PM  ?GAD 7 : Generalized Anxiety Score  ?Nervous, Anxious, on Edge '3 3 3 1  ' ?Control/stop worrying 3 2 0 2  ?Worry too much - different things '3 3 1 1  ' ?Trouble relaxing '3 3 3 3  ' ?Restless '1 3 1 1  ' ?Easily annoyed or irritable '3 3 3 3  ' ?Afraid - awful might happen 2 1 0 1  ?Total GAD 7 Score '18 18 11 12  ' ?Anxiety Difficulty Extremely difficult Extremely difficult Very difficult Extremely difficult  ? ?NECK PAIN ?Patient states she has been dealing with neck pain on and off for about a year.  It had improved then two weeks ago  she woke up and it was a lot worse.  Patient did not do the physical therapy that was previously recommended to her.  ? ?Past Medical History:  ?Diagnosis Date  ? Mood swings   ? Seizures ([facility]   ? as a child  ? [facility]  ? Thrombocytopenia complicating pregnancy ([facility]   ? 2018  ? Thyroid disease   ? ? ?Past Surgical History:  ?Procedure Laterality Date  ? NO PAST SURGERIES    ? ? ?Family History  ?Problem Relation Age of Onset  ? Healthy Mother   ? Colon cancer Father   ? Hashimoto's thyroiditis Sister   ? Breast cancer Maternal Grandmother   ? Diabetes Maternal Grandmother   ? Breast cancer Paternal Grandmother   ? COPD Paternal Grandmother   ? Brain cancer Paternal Grandfather   ? ? ?Social History  ? ?Socioeconomic History  ? Marital status: Single  ?  Spouse name: Not on  file  ? Number of children: Not on file  ? Years of education: Not on file  ? Highest education level: Not on file  ?Occupational History  ? Not on file  ?Tobacco Use  ? Smoking status: Former  ?  Types: Cigarettes  ?  Quit date: 07/13/2010  ?  Years since quitting: 11.3  ? Smokeless tobacco: Never  ?Vaping Use  ? Vaping Use: Never used  ?Substance and Sexual Activity  ? Alcohol use: Yes  ?  Comment: rare  ? Drug use: No  ? Sexual activity: Yes  ?  Birth control/protection: Pill  ?Other Topics Concern  ? Not on file  ?Social History Narrative  ? Not on file  ? ?Social Determinants of Health  ? ?Financial Resource Strain: Not on file  ?Food Insecurity: Not on file  ?Transportation Needs: Not on file  ?Physical Activity: Not on file  ?Stress: Not on file  ?Social Connections: Not on file  ?Intimate Partner Violence: Not on file  ? ? ?Outpatient Medications Prior to Visit  ?Medication Sig Dispense Refill  ? buPROPion (WELLBUTRIN XL) 150 MG 24 hr tablet Take 1 tablet (150 mg total) by mouth daily. 90 tablet 1  ? fluticasone (FLONASE) 50 MCG/ACT nasal spray Place 2 sprays into both nostrils  daily. 16 g 6  ? gabapentin (NEURONTIN) 300 MG capsule Take 1 capsule (300 mg total) by mouth 2 (two) times daily. Start taking 1 capsule at bedtime, if needed after 3 days can increase to twice a day 60 capsule 1  ? levothyroxine (SYNTHROID) 25 MCG tablet Take 1 tablet (25 mcg total) by mouth daily. 90 tablet 0  ? norgestimate-ethinyl estradiol (ORTHO-CYCLEN) 0.25-35 MG-MCG tablet Take 1 tablet by mouth daily. 84 tablet 3  ? vitamin C (ASCORBIC ACID) 250 MG tablet Take 250 mg by mouth daily.    ? busPIRone (BUSPAR) 7.5 MG tablet TAKE 1 TABLET (7.5 MG TOTAL) BY MOUTH 2 (TWO) TIMES DAILY AS NEEDED (ANXIETY, NERVOUSNESS). (Patient not taking: Reported on 10/29/2021) 180 tablet 1  ? cyclobenzaprine (FLEXERIL) 10 MG tablet Take 1 tablet (10 mg total) by mouth 3 (three) times daily as needed for muscle spasms. (Patient not taking: Reported on 10/29/2021) 30 tablet 0  ? venlafaxine XR (EFFEXOR XR) 37.5 MG 24 hr capsule Take 1 capsule (37.5 mg total) by mouth daily with breakfast. (Patient not taking: Reported on 10/29/2021) 30 capsule 0  ? ?No facility-administered medications prior to visit.  ? ? ?No Known Allergies ? ?ROS ?Review of Systems  ?Constitutional:  Positive for fatigue.  ?Respiratory: Negative.    ?Cardiovascular: Negative.   ?Gastrointestinal: Negative.   ?Genitourinary: Negative.   ?Musculoskeletal:  Positive for neck pain and neck stiffness.  ?Skin: Negative.   ?Neurological: Negative.   ?Psychiatric/Behavioral:  Positive for dysphoric mood. Negative for suicidal ideas. The patient is nervous/anxious.   ? ?  ?Objective:  ?  ?Physical Exam ?Vitals and nursing note reviewed.  ?Constitutional:   ?   General: She is not in acute distress. ?   Appearance: Normal appearance.  ?HENT:  ?   Head: Normocephalic and atraumatic.  ?Eyes:  ?   Conjunctiva/sclera: Conjunctivae normal.  ?Cardiovascular:  ?   Rate and Rhythm: Normal rate and regular rhythm.  ?   Pulses: Normal pulses.  ?   Heart sounds: Normal heart sounds.   ?Pulmonary:  ?   Effort: Pulmonary effort is normal.  ?   Breath sounds: Normal breath sounds.  ?Musculoskeletal:     ?  General: Tenderness: cervical spine, top of right shoulder.  ?   Cervical back: No signs of trauma. Pain with movement and muscular tenderness present. Decreased range of motion.  ?   Comments: Pain with empty can test and spurling test  ?Skin: ?   General: Skin is warm and dry.  ?Neurological:  ?   General: No focal deficit present.  ?   Mental Status: She is alert and oriented to person, place, and time.  ?Psychiatric:     ?   Mood and Affect: Mood normal.     ?   Behavior: Behavior normal.     ?   Thought Content: Thought content normal.     ?   Judgment: Judgment normal.  ? ? ?BP (!) 133/91   Pulse 82   Temp 98.7 ?F (37.1 ?C) (Oral)   Wt 188 lb (85.3 kg)   SpO2 99%   BMI 33.31 kg/m?  ?Wt Readings from Last 3 Encounters:  ?10/29/21 188 lb (85.3 kg)  ?09/10/21 189 lb 6.4 oz (85.9 kg)  ?06/22/21 195 lb 3.2 oz (88.5 kg)  ? ? ? ?Health Maintenance Due  ?Topic Date Due  ? COVID-19 Vaccine (2 - Pfizer risk series) 06/16/2020  ? ? ? ?There are no preventive care reminders to display for this patient. ? ?Lab Results  ?Component Value Date  ? TSH 4.240 09/10/2021  ? ?Lab Results  ?Component Value Date  ? WBC 7.4 05/07/2021  ? HGB 12.6 05/07/2021  ? HCT 37.4 05/07/2021  ? MCV 84 05/07/2021  ? PLT 218 05/07/2021  ? ?Lab Results  ?Component Value Date  ? NA 139 09/10/2021  ? K 4.0 09/10/2021  ? CO2 20 09/10/2021  ? GLUCOSE 77 09/10/2021  ? BUN 9 09/10/2021  ? CREATININE 0.84 09/10/2021  ? BILITOT 0.4 09/10/2021  ? ALKPHOS 90 09/10/2021  ? AST 22 09/10/2021  ? ALT 16 09/10/2021  ? PROT 7.8 09/10/2021  ? ALBUMIN 4.7 09/10/2021  ? CALCIUM 9.5 09/10/2021  ? ANIONGAP 9 08/11/2018  ? EGFR 92 09/10/2021  ? ?Lab Results  ?Component Value Date  ? CHOL 214 (H) 01/22/2020  ? ?Lab Results  ?Component Value Date  ? HDL 62 01/22/2020  ? ?Lab Results  ?Component Value Date  ? [place] 118 (H) 01/22/2020  ? ?Lab  Results  ?Component Value Date  ? TRIG 199 (H) 01/22/2020  ? ?No results found for: CHOLHDL ?No results found for: HGBA1C ? ?  ?Assessment & Plan:  ? ?Problem List Items Addressed This Visit   ? ?  ? Endocrine  ? Terie Purser

## 2021-10-29 ENCOUNTER — Encounter: Payer: Self-pay | Admitting: Nurse Practitioner

## 2021-10-29 ENCOUNTER — Ambulatory Visit: Payer: Medicaid Other | Admitting: Nurse Practitioner

## 2021-10-29 VITALS — BP 133/91 | HR 82 | Temp 98.7°F | Wt 188.0 lb

## 2021-10-29 DIAGNOSIS — M542 Cervicalgia: Secondary | ICD-10-CM | POA: Diagnosis not present

## 2021-10-29 DIAGNOSIS — F32A Depression, unspecified: Secondary | ICD-10-CM | POA: Diagnosis not present

## 2021-10-29 DIAGNOSIS — F419 Anxiety disorder, unspecified: Secondary | ICD-10-CM | POA: Diagnosis not present

## 2021-10-29 DIAGNOSIS — E079 Disorder of thyroid, unspecified: Secondary | ICD-10-CM | POA: Diagnosis not present

## 2021-10-29 MED ORDER — KETOROLAC TROMETHAMINE 60 MG/2ML IM SOLN
60.0000 mg | Freq: Once | INTRAMUSCULAR | Status: AC
  Administered 2021-10-29: 60 mg via INTRAMUSCULAR

## 2021-10-29 MED ORDER — SERTRALINE HCL 25 MG PO TABS: 25.0000 mg | ORAL_TABLET | Freq: Every day | ORAL | 0 refills | Status: DC

## 2021-10-29 MED ORDER — CYCLOBENZAPRINE HCL 10 MG PO TABS: 10.0000 mg | ORAL_TABLET | Freq: Three times a day (TID) | ORAL | 0 refills | Status: DC | PRN

## 2021-10-29 NOTE — Assessment & Plan Note (Signed)
>>  ASSESSMENT AND PLAN FOR NECK PAIN WRITTEN ON 10/29/2021 10:12 AM BY Larae Grooms, NP  Chronic. Ongoing x 1 year. Will give toradol in office today. Will refill Flexeril for patient.  New referral placed for patient to see Physical Therapy.

## 2021-10-29 NOTE — Assessment & Plan Note (Signed)
Chronic. Most recent TSH and T4 are within normal range. She was previously on 110mcg and feeling a lot better.  Discussed risk of hyperthyroidism with increased dose of Levothyroxine.  Will refer to Endocrinology if patient's fatigue is not improved with change in depression and anxiety medication. Follow up in 6 weeks. Will redraw labs at that time as well. ?

## 2021-10-29 NOTE — Assessment & Plan Note (Signed)
Chronic. Ongoing x 1 year. Will give toradol in office today. Will refill Flexeril for patient.  New referral placed for patient to see Physical Therapy.  ?

## 2021-10-29 NOTE — Assessment & Plan Note (Signed)
Chronic. Not well controlled. Would like to go back to Zoloft with the Wellbutrin.  Will start at 25mg  and increase to 50mg  after 2 weeks. After 4 weeks can increase to 100mg  daily.  Follow up in 6 weeks for reevaluation. ?

## 2021-10-30 ENCOUNTER — Encounter: Payer: Self-pay | Admitting: Nurse Practitioner

## 2021-10-30 MED ORDER — MELOXICAM 7.5 MG PO TABS: 7.5000 mg | ORAL_TABLET | Freq: Every day | ORAL | 0 refills | Status: DC

## 2021-11-06 ENCOUNTER — Other Ambulatory Visit: Payer: Self-pay

## 2021-11-06 MED ORDER — NORGESTIMATE-ETH ESTRADIOL 0.25-35 MG-MCG PO TABS: 1.0000 | ORAL_TABLET | Freq: Every day | ORAL | 3 refills | Status: DC

## 2021-11-06 NOTE — Telephone Encounter (Signed)
Last appt 10/29/21  ? ?Up coming appt 04/30/22 ? ?Last pap was 01/22/20 ?

## 2021-11-25 ENCOUNTER — Encounter: Payer: Self-pay | Admitting: Nurse Practitioner

## 2021-12-10 ENCOUNTER — Other Ambulatory Visit: Payer: Self-pay | Admitting: Nurse Practitioner

## 2021-12-10 NOTE — Telephone Encounter (Signed)
Requested medication (s) are due for refill today: yes  Requested medication (s) are on the active medication list: yes  Last refill:  10/29/21 #30/0  Future visit scheduled: yes  Notes to clinic:  Unable to refill per protocol, cannot delegate.    Requested Prescriptions  Pending Prescriptions Disp Refills   cyclobenzaprine (FLEXERIL) 10 MG tablet [Pharmacy Med Name: CYCLOBENZAPRINE 10 MG TABLET] 30 tablet 0    Sig: TAKE 1 TABLET BY MOUTH THREE TIMES A DAY AS NEEDED FOR MUSCLE SPASMS     Not Delegated - Analgesics:  Muscle Relaxants Failed - 12/10/2021 11:05 AM      Failed - This refill cannot be delegated      Passed - Valid encounter within last 6 months    Recent Outpatient Visits           1 month ago Anxiety and depression   [facility] Larae Grooms, NP   3 months ago Anxiety and depression   [facility] Larae Grooms, NP   5 months ago Neck pain   [facility] McElwee, Lauren A, NP   7 months ago Anxiety and depression   [facility] McElwee, Lauren A, NP   1 year ago Fatigue, unspecified type   [facility] Rumball, Darl Householder, DO       Future Appointments             In 4 months Larae Grooms, NP [facility]

## 2022-01-14 ENCOUNTER — Other Ambulatory Visit: Payer: Self-pay | Admitting: Nurse Practitioner

## 2022-01-15 ENCOUNTER — Encounter: Payer: Medicaid Other | Admitting: Nurse Practitioner

## 2022-01-19 ENCOUNTER — Other Ambulatory Visit: Payer: Self-pay

## 2022-01-19 ENCOUNTER — Encounter: Payer: Self-pay | Admitting: Nurse Practitioner

## 2022-01-19 MED ORDER — BUPROPION HCL ER (XL) 150 MG PO TB24: 150.0000 mg | ORAL_TABLET | Freq: Every day | ORAL | 1 refills | Status: DC

## 2022-01-21 NOTE — Telephone Encounter (Signed)
Can we call the pharmacy and verify what patient picked at prior to yesterday's refill.  Also find out who prescribed it.

## 2022-01-22 MED ORDER — BUPROPION HCL ER (XL) 300 MG PO TB24: 300.0000 mg | ORAL_TABLET | Freq: Every day | ORAL | 1 refills | Status: DC

## 2022-01-22 NOTE — Telephone Encounter (Signed)
Pt was getting 3oo mg bupropion prescribed by Rodman Pickle.Trinna Post from [facility] spoke with me.

## 2022-01-23 ENCOUNTER — Other Ambulatory Visit: Payer: Self-pay | Admitting: Nurse Practitioner

## 2022-01-25 NOTE — Telephone Encounter (Signed)
Patient is to take 4 daily to equal 100mg - number given per month would be #120 Requested Prescriptions  Pending Prescriptions Disp Refills  . sertraline (ZOLOFT) 25 MG tablet [Pharmacy Med Name: SERTRALINE HCL 25 MG TABLET] 90 tablet 0    Sig: TAKE 1 TABLET (25 MG TOTAL) BY MOUTH DAILY. TAKE 1 TAB DAILY FOR 2 WEEKS. INCREASE TO 2 TABS DAILY FOR WEEKS 2-4 AND THEN INCREASE TO 4 TABS.     Psychiatry:  Antidepressants - SSRI - sertraline Passed - 01/23/2022  8:30 AM      Passed - AST in normal range and within 360 days    AST  Date Value Ref Range Status  09/10/2021 22 0 - 40 IU/L Final   SGOT(AST)  Date Value Ref Range Status  11/02/2013 24 15 - 37 Unit/L Final         Passed - ALT in normal range and within 360 days    ALT  Date Value Ref Range Status  09/10/2021 16 0 - 32 IU/L Final   SGPT (ALT)  Date Value Ref Range Status  11/02/2013 25 12 - 78 U/L Final         Passed - Completed PHQ-2 or PHQ-9 in the last 360 days      Passed - Valid encounter within last 6 months    Recent Outpatient Visits          2 months ago Anxiety and depression   [facility], NP   4 months ago Anxiety and depression   [facility], NP   7 months ago Neck pain   [facility] McElwee, Lauren A, NP   8 months ago Anxiety and depression   [facility] McElwee, Lauren A, NP   1 year ago Fatigue, unspecified type   [facility] Rumball, Larae Grooms, DO      Future Appointments            In 3 months Darl Householder, NP [facility], [facility]

## 2022-02-02 ENCOUNTER — Ambulatory Visit (INDEPENDENT_AMBULATORY_CARE_PROVIDER_SITE_OTHER)
Admit: 2022-02-02 | Discharge: 2022-02-02 | Disposition: A | Discharge status: Home / Self Care | Payer: Medicaid Other | Attending: Emergency Medicine | Admitting: Emergency Medicine

## 2022-02-02 ENCOUNTER — Ambulatory Visit
Admission: EM | Admit: 2022-02-02 | Discharge: 2022-02-02 | Disposition: A | Discharge status: Home / Self Care | Payer: Medicaid Other | Attending: Emergency Medicine | Admitting: Emergency Medicine

## 2022-02-02 DIAGNOSIS — R1011 Right upper quadrant pain: Secondary | ICD-10-CM

## 2022-02-02 DIAGNOSIS — R1013 Epigastric pain: Secondary | ICD-10-CM | POA: Insufficient documentation

## 2022-02-02 LAB — COMPREHENSIVE METABOLIC PANEL
ALT: 19 U/L (ref 0–44)
AST: 17 U/L (ref 15–41)
Albumin: 3.9 g/dL (ref 3.5–5.0)
Alkaline Phosphatase: 86 U/L (ref 38–126)
Anion gap: 9 (ref 5–15)
BUN: 10 mg/dL (ref 6–20)
CO2: 24 mmol/L (ref 22–32)
Calcium: 8.9 mg/dL (ref 8.9–10.3)
Chloride: 103 mmol/L (ref 98–111)
Creatinine, Ser: 0.8 mg/dL (ref 0.44–1.00)
GFR, Estimated: >60 mL/min (ref >60)
Glucose, Bld: 78 mg/dL (ref 70–99)
Potassium: 3.7 mmol/L (ref 3.5–5.1)
Sodium: 136 mmol/L (ref 135–145)
Total Bilirubin: 0.4 mg/dL (ref 0.3–1.2)
Total Protein: 8.1 g/dL (ref 6.5–8.1)

## 2022-02-02 LAB — CBC WITH DIFFERENTIAL/PLATELET
Abs Immature Granulocytes: 0.03 10*3/uL (ref 0.00–0.07)
Basophils Absolute: 0 10*3/uL (ref 0.0–0.1)
Basophils Relative: 0 %
Eosinophils Absolute: 0.1 10*3/uL (ref 0.0–0.5)
Eosinophils Relative: 1 %
HCT: 36.5 % (ref 36.0–46.0)
Hemoglobin: 12.2 g/dL (ref 12.0–15.0)
Immature Granulocytes: 0 %
Lymphocytes Relative: 17 %
Lymphs Abs: 1.8 10*3/uL (ref 0.7–4.0)
MCH: 28.4 pg (ref 26.0–34.0)
MCHC: 33.4 g/dL (ref 30.0–36.0)
MCV: 85.1 fL (ref 80.0–100.0)
Monocytes Absolute: 0.8 10*3/uL (ref 0.1–1.0)
Monocytes Relative: 8 %
Neutro Abs: 7.7 10*3/uL (ref 1.7–7.7)
Neutrophils Relative %: 74 %
Platelets: 159 10*3/uL (ref 150–400)
RBC: 4.29 MIL/uL (ref 3.87–5.11)
RDW: 13.6 % (ref 11.5–15.5)
WBC: 10.6 10*3/uL — ABNORMAL HIGH (ref 4.0–10.5)
nRBC: 0 % (ref 0.0–0.2)

## 2022-02-02 LAB — LIPASE, BLOOD: Lipase: 26 U/L (ref 11–51)

## 2022-02-02 MED ORDER — DICYCLOMINE HCL 20 MG PO TABS: 20.0000 mg | ORAL_TABLET | Freq: Two times a day (BID) | ORAL | 0 refills | Status: DC

## 2022-02-02 NOTE — ED Provider Notes (Signed)
[facility]    CSN: [id number] Arrival date & time: 02/02/22  1518      History   Chief Complaint Chief Complaint  Patient presents with   Abdominal Pain    HPI Maureen Hoffman is a 36 y.o. female.   HPI  72 old female here for evaluation of upper abdominal pain.  Patient reports that for the last 2 days she has been experiencing pain in her upper abdomen that is a constant dull ache but will occasionally become sharp and spread across to both sides.  This does not radiate through to her back.  She states that she has taken Tums, Prevacid, and Pepto-Bismol without any relief of her symptoms.  She states that she does have nausea when the pain gets sharp.  She also has a decreased appetite but indicates that when she eats the pain becomes worse.  She denies any fever, vomiting, urinary complaints, vaginal itching, or vaginal discharge.  Does report that the pain increases on the right upper quadrant of her abdomen when she takes a deep breath.  Past Medical History:  Diagnosis Date   Mood swings    Seizures (HCC)    as a child   Thrombocytopenia affecting pregnancy, antepartum (HCC) 12/03/2016   Thrombocytopenia complicating pregnancy (HCC)    2018   Thyroid disease     Patient Active Problem List   Diagnosis Date Noted   Thyroid disease    Neck pain 05/08/2021   Obesity (BMI 30-39.9) 05/08/2021   Elevated TSH 09/02/2020   Hypertension 09/02/2020   Anxiety and depression 11/13/2019   Hair loss 11/13/2019    Past Surgical History:  Procedure Laterality Date   NO PAST SURGERIES      OB History     Gravida  3   Para  1   Term  1   Preterm      AB  1   Living  2      SAB  1   IAB      Ectopic      Multiple  0   Live Births  1            Home Medications    Prior to Admission medications   Medication Sig Start Date End Date Taking? Authorizing Provider  buPROPion (WELLBUTRIN XL) 300 MG 24 hr tablet Take 1 tablet (300 mg  total) by mouth daily. 01/22/22  Yes Larae Grooms, NP  cyclobenzaprine (FLEXERIL) 10 MG tablet TAKE 1 TABLET BY MOUTH THREE TIMES A DAY AS NEEDED FOR MUSCLE SPASMS 01/15/22  Yes Cannady, Jolene T, NP  dicyclomine (BENTYL) 20 MG tablet Take 1 tablet (20 mg total) by mouth 2 (two) times daily. 02/02/22  Yes Becky Augusta, NP  fluticasone (FLONASE) 50 MCG/ACT nasal spray Place 2 sprays into both nostrils daily. 09/10/21  Yes Larae Grooms, NP  gabapentin (NEURONTIN) 300 MG capsule TAKE 1 CAPSULE (300 MG TOTAL) BY MOUTH 2 (TWO) TIMES DAILY. START TAKING 1 CAPSULE AT BEDTIME, IF NEEDED AFTER 3 DAYS CAN INCREASE TO TWICE A DAY 12/10/21  Yes Larae Grooms, NP  levothyroxine (SYNTHROID) 25 MCG tablet TAKE 1 TABLET BY MOUTH EVERY DAY 01/15/22  Yes Cannady, Jolene T, NP  meloxicam (MOBIC) 7.5 MG tablet Take 1 tablet (7.5 mg total) by mouth daily. 10/30/21  Yes Larae Grooms, NP  norgestimate-ethinyl estradiol (ORTHO-CYCLEN) 0.25-35 MG-MCG tablet Take 1 tablet by mouth daily. 11/06/21  Yes Cannady, Jolene T, NP  sertraline (ZOLOFT) 25 MG tablet  TAKE 1 TABLET (25 MG TOTAL) BY MOUTH DAILY. TAKE 1 TAB DAILY FOR 2 WEEKS. INCREASE TO 2 TABS DAILY FOR WEEKS 2-4 AND THEN INCREASE TO 4 TABS. 01/25/22  Yes Larae Grooms, NP  vitamin C (ASCORBIC ACID) 250 MG tablet Take 250 mg by mouth daily.    [provider]    Family History Family History  Problem Relation Age of Onset   Healthy Mother    Colon cancer Father    Hashimoto's thyroiditis Sister    Breast cancer Maternal Grandmother    Diabetes Maternal Grandmother    Breast cancer Paternal Grandmother    COPD Paternal Grandmother    Brain cancer Paternal Grandfather     Social History Social History   Tobacco Use   Smoking status: Former    Types: Cigarettes    Quit date: 07/13/2010    Years since quitting: 11.5   Smokeless tobacco: Never  Vaping Use   Vaping Use: Never used  Substance Use Topics   Alcohol use: Yes    Comment:  rare   Drug use: No     Allergies   Patient has no known allergies.   Review of Systems Review of Systems  Constitutional:  Negative for fever.  Gastrointestinal:  Positive for abdominal pain and nausea. Negative for diarrhea and vomiting.  Musculoskeletal:  Negative for back pain.  Skin:  Negative for rash.  Hematological: Negative.   Psychiatric/Behavioral: Negative.       Physical Exam Triage Vital Signs ED Triage Vitals  Enc Vitals Group     BP      Pulse      Resp      Temp      Temp src      SpO2      Weight      Height      Head Circumference      Peak Flow      Pain Score      Pain Loc      Pain Edu?      Excl. in GC?    No data found.  Updated Vital Signs BP (!) 147/91 (BP Location: Left Arm)   Pulse 90   Temp 98.9 F (37.2 C) (Oral)   Resp 18   Ht 5\' 3"  (1.6 m)   Wt 180 lb (81.6 kg)   LMP 01/26/2022   SpO2 100%   BMI 31.89 kg/m   Visual Acuity Right Eye Distance:   Left Eye Distance:   Bilateral Distance:    Right Eye Near:   Left Eye Near:    Bilateral Near:     Physical Exam Vitals and nursing note reviewed.  Constitutional:      Appearance: Normal appearance. She is ill-appearing.  HENT:     Head: Normocephalic and atraumatic.  Cardiovascular:     Rate and Rhythm: Normal rate and regular rhythm.     Pulses: Normal pulses.     Heart sounds: Normal heart sounds. No murmur heard.    No friction rub. No gallop.  Pulmonary:     Effort: Pulmonary effort is normal.     Breath sounds: Normal breath sounds. No wheezing, rhonchi or rales.  Abdominal:     General: Abdomen is flat. Bowel sounds are normal.     Palpations: Abdomen is soft.     Tenderness: There is abdominal tenderness. There is no guarding or rebound.  Skin:    General: Skin is warm and dry.  Capillary Refill: Capillary refill takes less than 2 seconds.     Findings: No erythema or rash.  Neurological:     General: No focal deficit present.     Mental Status:  She is alert and oriented to person, place, and time.  Psychiatric:        Mood and Affect: Mood normal.        Behavior: Behavior normal.        Thought Content: Thought content normal.        Judgment: Judgment normal.      UC Treatments / Results  Labs (all labs ordered are listed, but only abnormal results are displayed) Labs Reviewed  CBC WITH DIFFERENTIAL/PLATELET - Abnormal; Notable for the following components:      Result Value   WBC 10.6 (*)    All other components within normal limits  COMPREHENSIVE METABOLIC PANEL  LIPASE, BLOOD    EKG   Radiology US Abdomen Limited RUQ (LIVER/GB)  Result Date: 02/02/2022 CLINICAL DATA:  Pain right upper quadrant EXAM: ULTRASOUND ABDOMEN LIMITED RIGHT UPPER QUADRANT COMPARISON:  CT done on 08/11/2018 FINDINGS: Gallbladder: No gallstones or wall thickening visualized. No sonographic Murphy sign noted by sonographer. Common bile duct: Diameter: 3.4 mm Liver: There are few small hyperechoic foci in the liver largest measuring 1.5 cm in maximum diameter suggesting possible hemangiomas. There is no dilation of intrahepatic bile ducts. Portal vein is patent on color Doppler imaging with normal direction of blood flow towards the liver. Other: None. IMPRESSION: There are no demonstrable gallbladder stones. There are no signs of acute cholecystitis. There are few small hyperechoic foci in liver suggesting possible hemangiomas. Electronically Signed   By: Ernie Avena M.D.   On: 02/02/2022 17:31    Procedures Procedures (including critical care time)  Medications Ordered in UC Medications - No data to display  Initial Impression / Assessment and Plan / UC Course  I have reviewed the triage vital signs and the nursing notes.  Pertinent labs & imaging results that were available during my care of the patient were reviewed by me and considered in my medical decision making (see chart for details).  Patient is a pleasant, though mildly  ill-appearing, 36 year old female here for evaluation of 2 days worth of constant dull upper abdominal pain that will occasionally become sharp and radiate across her abdomen in both directions.  No radiation through to her back.  She does endorse nausea but no vomiting.  She states that the nausea mostly comes on when the pain gets sharp.  She has had a decreased appetite but also indicates that when she eats the pain gets worse.  She has tried over-the-counter antacids without any improvement of her symptoms.  She also denies fever.  She does not still have her gallbladder.  Physical exam reveals a benign cardiopulmonary exam with S1-S2 heart sounds with regular rate and rhythm and lung sounds that are clear to auscultation all fields.  Abdomen is soft, flat, with epigastric and right upper quadrant tenderness.  The remainder the abdomen is benign.  No guarding or rebound.  Given that patient has this continuous dull epigastric abdominal pain, sharp increase in pain in the right upper quadrant with deep respiration, worsening of pain with eating, decreased appetite, nausea I am concerned that she may have gallbladder disease.  I will order CBC, CMP, and a lipase.  I will also order a right upper quadrant ultrasound to look at the patient's gallbladder.  CBC shows a very  mildly elevated white count of 10.6.  The remainder of the differential is unremarkable.  Gallbladder ultrasound report from radiology states that there are no demonstrable gallbladder stones or signs of acute cholecystitis.  There are a few small hyperechoic foci in the liver suggesting possible hemangiomas.  I will have the patient follow-up with her PCP regarding the hemangiomas.  CMP is unremarkable.  Lipase is unremarkable.  Etiology of the patient's pain is on clear as there is no evidence of cholecystitis on ultrasound or through her lab work.  She does have a mildly elevated white count but no significant leukocytosis.  Patient  may have some degree of gastritis and I will discharge her home on Bentyl 20 mg every 6 hours as needed.  If her symptoms persist she should follow-up with her primary care provider or return for reevaluation.   Final Clinical Impressions(s) / UC Diagnoses   Final diagnoses:  Epigastric pain     Discharge Instructions      Your blood work today was very reassuring.  There is no evidence of infection, elevation of liver enzymes, or elevation of digestive enzymes associated with your gallbladder or pancreas.  The ultrasound did not demonstrate any evidence of gallstones or gallbladder inflammation.  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  These are usually benign.    Your pain may be caused by stomach inflammation, also known as gastritis.  I will prescribe you Bentyl that she can use for abdominal pain and cramping.  You can take this every 6 hours as needed for pain.  You may also continue to use over-the-counter Prevacid or Zantac to help decrease acid production in your stomach.  If your symptoms continue, or they worsen, either follow-up with your primary care provider or return for reevaluation.     ED Prescriptions     Medication Sig Dispense Auth. Provider   dicyclomine (BENTYL) 20 MG tablet Take 1 tablet (20 mg total) by mouth 2 (two) times daily. 20 tablet Becky Augusta, NP      PDMP not reviewed this encounter.   Becky Augusta, NP 02/02/22 1750

## 2022-02-02 NOTE — ED Triage Notes (Signed)
Pt c/o upper abdominal pain x2days.   Pt had a full bowel movement today.   Pt states that the pain is under the rib cage and spreads to either sides of her upper abdomen before going away.   Pt states that it hurts when taking a deep breath   Pt denies hitting her side or having any falls.   Pt states the pain comes about 2-3 times an hour.   Pt denies any urinary pain or odor  Pt states that she feels best when laying on her left side and the pain feels worse on the right side.

## 2022-02-02 NOTE — Discharge Instructions (Addendum)
Your blood work today was very reassuring.  There is no evidence of infection, elevation of liver enzymes, or elevation of digestive enzymes associated with your gallbladder or pancreas.  The ultrasound did not demonstrate any evidence of gallstones or gallbladder inflammation.  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  These are usually benign.    Your pain may be caused by stomach inflammation, also known as gastritis.  I will prescribe you Bentyl that she can use for abdominal pain and cramping.  You can take this every 6 hours as needed for pain.  You may also continue to use over-the-counter Prevacid or Zantac to help decrease acid production in your stomach.  If your symptoms continue, or they worsen, either follow-up with your primary care provider or return for reevaluation.

## 2022-02-08 ENCOUNTER — Other Ambulatory Visit: Payer: Self-pay | Admitting: Nurse Practitioner

## 2022-02-09 ENCOUNTER — Other Ambulatory Visit: Payer: Self-pay | Admitting: Nurse Practitioner

## 2022-02-09 NOTE — Telephone Encounter (Signed)
Requested Prescriptions  Pending Prescriptions Disp Refills  . sertraline (ZOLOFT) 25 MG tablet [Pharmacy Med Name: SERTRALINE HCL 25 MG TABLET] 360 tablet 0    Sig: TAKE 1 TAB DAILY FOR 2 WEEKS. INCREASE TO 2 TABS DAILY FOR WEEKS 2-4 AND THEN INCREASE TO 4 TABS.     Psychiatry:  Antidepressants - SSRI - sertraline Passed - 02/08/2022 12:21 PM      Passed - AST in normal range and within 360 days    AST  Date Value Ref Range Status  02/02/2022 17 15 - 41 U/L Final   SGOT(AST)  Date Value Ref Range Status  11/02/2013 24 15 - 37 Unit/L Final         Passed - ALT in normal range and within 360 days    ALT  Date Value Ref Range Status  02/02/2022 19 0 - 44 U/L Final   SGPT (ALT)  Date Value Ref Range Status  11/02/2013 25 12 - 78 U/L Final         Passed - Completed PHQ-2 or PHQ-9 in the last 360 days      Passed - Valid encounter within last 6 months    Recent Outpatient Visits          3 months ago Anxiety and depression   [facility] Larae Grooms, NP   5 months ago Anxiety and depression   [facility] Larae Grooms, NP   7 months ago Neck pain   [facility] McElwee, Lauren A, NP   9 months ago Anxiety and depression   [facility] McElwee, Lauren A, NP   1 year ago Fatigue, unspecified type   [facility] Rumball, Darl Householder, DO      Future Appointments            In 2 months Larae Grooms, NP [facility]

## 2022-02-10 NOTE — Telephone Encounter (Signed)
Requested medication (s) are due for refill today:   Yes  Requested medication (s) are on the active medication list:   Yes  Future visit scheduled:   Yes   Last ordered: 02/09/2022 #360, 0 refills  Returned because pharmacy needs clarification on dose.   See phar note.   Requested Prescriptions  Pending Prescriptions Disp Refills   sertraline (ZOLOFT) 25 MG tablet [Pharmacy Med Name: SERTRALINE HCL 25 MG TABLET] 360 tablet 0    Sig: TAKE 1 TAB DAILY FOR 2 WEEKS. INCREASE TO 2 TABS DAILY FOR WEEKS 2-4 AND THEN INCREASE TO 4 TABS.     Psychiatry:  Antidepressants - SSRI - sertraline Passed - 02/09/2022  1:59 PM      Passed - AST in normal range and within 360 days    AST  Date Value Ref Range Status  02/02/2022 17 15 - 41 U/L Final   SGOT(AST)  Date Value Ref Range Status  11/02/2013 24 15 - 37 Unit/L Final         Passed - ALT in normal range and within 360 days    ALT  Date Value Ref Range Status  02/02/2022 19 0 - 44 U/L Final   SGPT (ALT)  Date Value Ref Range Status  11/02/2013 25 12 - 78 U/L Final         Passed - Completed PHQ-2 or PHQ-9 in the last 360 days      Passed - Valid encounter within last 6 months    Recent Outpatient Visits           3 months ago Anxiety and depression   [facility] Larae Grooms, NP   5 months ago Anxiety and depression   [facility] Larae Grooms, NP   7 months ago Neck pain   [facility] McElwee, Lauren A, NP   9 months ago Anxiety and depression   [facility] McElwee, Lauren A, NP   1 year ago Fatigue, unspecified type   [facility] Rumball, Darl Householder, DO       Future Appointments             In 2 months Larae Grooms, NP [facility]

## 2022-02-16 ENCOUNTER — Telehealth: Payer: Self-pay | Admitting: Nurse Practitioner

## 2022-02-16 DIAGNOSIS — E079 Disorder of thyroid, unspecified: Secondary | ICD-10-CM

## 2022-02-16 NOTE — Telephone Encounter (Signed)
[phone] Pt is calling to request referral to endo. Please advise

## 2022-02-16 NOTE — Telephone Encounter (Signed)
Called and spoke to patient. She states she would like to see Endo to manage her thyroid and medication.

## 2022-03-03 ENCOUNTER — Encounter: Payer: Medicaid Other | Admitting: Nurse Practitioner

## 2022-03-03 NOTE — Progress Notes (Deleted)
LMP 01/26/2022    Subjective:    Patient ID: Maureen Hoffman, female    DOB: 1985/07/29, 36 y.o.   MRN: [id number]  HPI: Maureen Hoffman is a 36 y.o. female presenting on 03/03/2022 for comprehensive medical examination. Current medical complaints include:{Blank single:19197::"none","***"}  She currently lives with: Menopausal Symptoms: {Blank single:19197::"yes","no"}  HYPOTHYROIDISM Thyroid control status:{Blank single:19197::"controlled","uncontrolled","better","worse","exacerbated","stable"} Satisfied with current treatment? {Blank single:19197::"yes","no"} Medication side effects: {Blank single:19197::"yes","no"} Medication compliance: {Blank single:19197::"excellent compliance","good compliance","fair compliance","poor compliance"} Etiology of hypothyroidism:  Recent dose adjustment:{Blank single:19197::"yes","no"} Fatigue: {Blank single:19197::"yes","no"} Cold intolerance: {Blank single:19197::"yes","no"} Heat intolerance: {Blank single:19197::"yes","no"} Weight gain: {Blank single:19197::"yes","no"} Weight loss: {Blank single:19197::"yes","no"} Constipation: {Blank single:19197::"yes","no"} Diarrhea/loose stools: {Blank single:19197::"yes","no"} Palpitations: {Blank single:19197::"yes","no"} Lower extremity edema: {Blank single:19197::"yes","no"} Anxiety/depressed mood: {Blank single:19197::"yes","no"}  MOOD  Depression Screen done today and results listed below:     10/29/2021    9:52 AM 09/10/2021    2:11 PM 06/22/2021    1:25 PM 05/07/2021    3:18 PM 01/22/2020   10:43 AM  Depression screen PHQ 2/9  Decreased Interest 2 3 2 3  0  Down, Depressed, Hopeless 3 3 1 2  0  PHQ - 2 Score 5 6 3 5  0  Altered sleeping 3 3 1 2 3   Tired, decreased energy 3 3 3 3 1   Change in appetite 3 3 2 3 2   Feeling bad or failure about yourself  3 3 0 3 1  Trouble concentrating 3 1 0 1 0  Moving slowly or fidgety/restless 1 0 0 0 0  Suicidal thoughts 0 0 0 0 0  PHQ-9 Score 21 19 9 17  7   Difficult doing work/chores Extremely dIfficult Very difficult   Somewhat difficult    The patient {has/does not have:19849} a history of falls. I {did/did not:19850} complete a risk assessment for falls. A plan of care for falls {was/was not:19852} documented.   Past Medical History:  Past Medical History:  Diagnosis Date   Mood swings    Seizures (HCC)    as a child   Thrombocytopenia affecting pregnancy, antepartum (HCC) 12/03/2016   Thrombocytopenia complicating pregnancy (HCC)    2018   Thyroid disease     Surgical History:  Past Surgical History:  Procedure Laterality Date   NO PAST SURGERIES      Medications:  Current Outpatient Medications on File Prior to Visit  Medication Sig   buPROPion (WELLBUTRIN XL) 300 MG 24 hr tablet Take 1 tablet (300 mg total) by mouth daily.   cyclobenzaprine (FLEXERIL) 10 MG tablet TAKE 1 TABLET BY MOUTH THREE TIMES A DAY AS NEEDED FOR MUSCLE SPASMS   dicyclomine (BENTYL) 20 MG tablet Take 1 tablet (20 mg total) by mouth 2 (two) times daily.   fluticasone (FLONASE) 50 MCG/ACT nasal spray Place 2 sprays into both nostrils daily.   gabapentin (NEURONTIN) 300 MG capsule TAKE 1 CAPSULE (300 MG TOTAL) BY MOUTH 2 (TWO) TIMES DAILY. START TAKING 1 CAPSULE AT BEDTIME, IF NEEDED AFTER 3 DAYS CAN INCREASE TO TWICE A DAY   levothyroxine (SYNTHROID) 25 MCG tablet TAKE 1 TABLET BY MOUTH EVERY DAY   meloxicam (MOBIC) 7.5 MG tablet Take 1 tablet (7.5 mg total) by mouth daily.   norgestimate-ethinyl estradiol (ORTHO-CYCLEN) 0.25-35 MG-MCG tablet Take 1 tablet by mouth daily.   sertraline (ZOLOFT) 25 MG tablet TAKE 1 TAB DAILY FOR 2 WEEKS. INCREASE TO 2 TABS DAILY FOR WEEKS 2-4 AND THEN INCREASE TO 4 TABS.   vitamin C (ASCORBIC ACID) 250 MG tablet Take 250  mg by mouth daily.   No current facility-administered medications on file prior to visit.    Allergies:  No Known Allergies  Social History:  Social History   Socioeconomic History   Marital  status: Single    Spouse name: Not on file   Number of children: Not on file   Years of education: Not on file   Highest education level: Not on file  Occupational History   Not on file  Tobacco Use   Smoking status: Former    Types: Cigarettes    Quit date: 07/13/2010    Years since quitting: 11.6   Smokeless tobacco: Never  Vaping Use   Vaping Use: Never used  Substance and Sexual Activity   Alcohol use: Yes    Comment: rare   Drug use: No   Sexual activity: Yes    Birth control/protection: Pill  Other Topics Concern   Not on file  Social History Narrative   Not on file   Social Determinants of Health   Financial Resource Strain: Not on file  Food Insecurity: Not on file  Transportation Needs: Not on file  Physical Activity: Not on file  Stress: Not on file  Social Connections: Not on file  Intimate Partner Violence: Not on file   Social History   Tobacco Use  Smoking Status Former   Types: Cigarettes   Quit date: 07/13/2010   Years since quitting: 11.6  Smokeless Tobacco Never   Social History   Substance and Sexual Activity  Alcohol Use Yes   Comment: rare    Family History:  Family History  Problem Relation Age of Onset   Healthy Mother    Colon cancer Father    Hashimoto's thyroiditis Sister    Breast cancer Maternal Grandmother    Diabetes Maternal Grandmother    Breast cancer Paternal Grandmother    COPD Paternal Grandmother    Brain cancer Paternal Grandfather     Past medical history, surgical history, medications, allergies, family history and social history reviewed with patient today and changes made to appropriate areas of the chart.   ROS All other ROS negative except what is listed above and in the HPI.      Objective:    LMP 01/26/2022   Wt Readings from Last 3 Encounters:  02/02/22 180 lb (81.6 kg)  10/29/21 188 lb (85.3 kg)  09/10/21 189 lb 6.4 oz (85.9 kg)    Physical Exam  Results for orders placed or performed  during the hospital encounter of 02/02/22  CBC with Differential  Result Value Ref Range   WBC 10.6 (H) 4.0 - 10.5 K/uL   RBC 4.29 3.87 - 5.11 MIL/uL   Hemoglobin 12.2 12.0 - 15.0 g/dL   HCT 09.3 23.5 - 57.3 %   MCV 85.1 80.0 - 100.0 fL   MCH 28.4 26.0 - 34.0 pg   MCHC 33.4 30.0 - 36.0 g/dL   RDW 22.0 25.4 - 27.0 %   Platelets 159 150 - 400 K/uL   nRBC 0.0 0.0 - 0.2 %   Neutrophils Relative % 74 %   Neutro Abs 7.7 1.7 - 7.7 K/uL   Lymphocytes Relative 17 %   Lymphs Abs 1.8 0.7 - 4.0 K/uL   Monocytes Relative 8 %   Monocytes Absolute 0.8 0.1 - 1.0 K/uL   Eosinophils Relative 1 %   Eosinophils Absolute 0.1 0.0 - 0.5 K/uL   Basophils Relative 0 %   Basophils Absolute 0.0 0.0 - 0.1 K/uL  Immature Granulocytes 0 %   Abs Immature Granulocytes 0.03 0.00 - 0.07 K/uL  Comprehensive metabolic panel  Result Value Ref Range   Sodium 136 135 - 145 mmol/L   Potassium 3.7 3.5 - 5.1 mmol/L   Chloride 103 98 - 111 mmol/L   CO2 24 22 - 32 mmol/L   Glucose, Bld 78 70 - 99 mg/dL   BUN 10 6 - 20 mg/dL   Creatinine, Ser 1.60 0.44 - 1.00 mg/dL   Calcium 8.9 8.9 - 10.9 mg/dL   Total Protein 8.1 6.5 - 8.1 g/dL   Albumin 3.9 3.5 - 5.0 g/dL   AST 17 15 - 41 U/L   ALT 19 0 - 44 U/L   Alkaline Phosphatase 86 38 - 126 U/L   Total Bilirubin 0.4 0.3 - 1.2 mg/dL   GFR, Estimated >32 >35 mL/min   Anion gap 9 5 - 15  Lipase, blood  Result Value Ref Range   Lipase 26 11 - 51 U/L      Assessment & Plan:   Problem List Items Addressed This Visit   None    Follow up plan: No follow-ups on file.   LABORATORY TESTING:  - Pap smear: {Blank single:19197::"pap done","not applicable","up to date","done elsewhere"}  IMMUNIZATIONS:   - Tdap: Tetanus vaccination status reviewed: {tetanus status:315746}. - Influenza: {Blank single:19197::"Up to date","Administered today","Postponed to flu season","Refused","Given elsewhere"} - Pneumovax: {Blank single:19197::"Up to date","Administered today","Not  applicable","Refused","Given elsewhere"} - Prevnar: {Blank single:19197::"Up to date","Administered today","Not applicable","Refused","Given elsewhere"} - COVID: {Blank single:19197::"Up to date","Administered today","Not applicable","Refused","Given elsewhere"} - HPV: {Blank single:19197::"Up to date","Administered today","Not applicable","Refused","Given elsewhere"} - Shingrix vaccine: {Blank single:19197::"Up to date","Administered today","Not applicable","Refused","Given elsewhere"}  SCREENING: -Mammogram: {Blank single:19197::"Up to date","Ordered today","Not applicable","Refused","Done elsewhere"}  - Colonoscopy: {Blank single:19197::"Up to date","Ordered today","Not applicable","Refused","Done elsewhere"}  - Bone Density: {Blank single:19197::"Up to date","Ordered today","Not applicable","Refused","Done elsewhere"}  -Hearing Test: {Blank single:19197::"Up to date","Ordered today","Not applicable","Refused","Done elsewhere"}  -Spirometry: {Blank single:19197::"Up to date","Ordered today","Not applicable","Refused","Done elsewhere"}   PATIENT COUNSELING:   Advised to take 1 mg of folate supplement per day if capable of pregnancy.   Sexuality: Discussed sexually transmitted diseases, partner selection, use of condoms, avoidance of unintended pregnancy  and contraceptive alternatives.   Advised to avoid cigarette smoking.  I discussed with the patient that most people either abstain from alcohol or drink within safe limits (<=14/week and <=4 drinks/occasion for males, <=7/weeks and <= 3 drinks/occasion for females) and that the risk for alcohol disorders and other health effects rises proportionally with the number of drinks per week and how often a drinker exceeds daily limits.  Discussed cessation/primary prevention of drug use and availability of treatment for abuse.   Diet: Encouraged to adjust caloric intake to maintain  or achieve ideal body weight, to reduce intake of dietary  saturated fat and total fat, to limit sodium intake by avoiding high sodium foods and not adding table salt, and to maintain adequate dietary potassium and calcium preferably from fresh fruits, vegetables, and low-fat dairy products.    stressed the importance of regular exercise  Injury prevention: Discussed safety belts, safety helmets, smoke detector, smoking near bedding or upholstery.   Dental health: Discussed importance of regular tooth brushing, flossing, and dental visits.    NEXT PREVENTATIVE PHYSICAL DUE IN 1 YEAR. No follow-ups on file.

## 2022-03-08 ENCOUNTER — Telehealth: Payer: Medicaid Other | Admitting: Emergency Medicine

## 2022-03-08 DIAGNOSIS — R8290 Unspecified abnormal findings in urine: Secondary | ICD-10-CM

## 2022-03-08 NOTE — Progress Notes (Signed)
Because you have no symptoms of discomfort or frequency with urination but have an abnormal smell, this is not something I can treat by evisit and  I feel your condition warrants further evaluation and I recommend that you be seen for a face to face visit.  Please contact your primary care physician practice  or go to an urgent care to be seen. Many offices offer virtual options to be seen via video if you are not comfortable going in person to a medical facility at this time.  NOTE: You will NOT be charged for this eVisit.  If you do not have a PCP, [facility] offers a free physician referral service available at [phone]. Our trained staff has the experience, knowledge and resources to put you in touch with a physician who is right for you.    If you are having a true medical emergency please call 911.   Your e-visit answers were reviewed by a board certified advanced clinical practitioner to complete your personal care plan.  Thank you for using e-Visits.      For an urgent face to face visit, [facility] has seven urgent care centers for your convenience:     [facility] at [facility] Directions [phone] [address] [place], [place] [address]    [facility] Health Urgent Care Center [facility]) Get Driving Directions [phone] [address] [place], [place] [address]  [facility] Health Urgent Care Center [facility]) Get Driving Directions [phone] [address] [place],  [place]  [address]  [facility] Health Urgent Care Center [facility] at [employer] Directions  [phone] [place],  [place] [address]   [facility] Health Urgent Care at [facility] Get Driving Directions [phone] [address], [address] [place], [place] [address]   [facility] Health Urgent Care at [facility] Get Driving Directions  [phone] [address] [place], [place] [address]   [facility] Health Urgent Care at [facility] Directions [phone] [address] [place], [place] [address]

## 2022-03-10 ENCOUNTER — Ambulatory Visit
Admission: RE | Admit: 2022-03-10 | Discharge: 2022-03-10 | Disposition: A | Discharge status: Home / Self Care | Payer: Medicaid Other | Source: Ambulatory Visit | Attending: Emergency Medicine | Admitting: Emergency Medicine

## 2022-03-10 VITALS — BP 128/98 | HR 86

## 2022-03-10 DIAGNOSIS — L739 Follicular disorder, unspecified: Secondary | ICD-10-CM

## 2022-03-10 DIAGNOSIS — N3000 Acute cystitis without hematuria: Secondary | ICD-10-CM

## 2022-03-10 DIAGNOSIS — Z113 Encounter for screening for infections with a predominantly sexual mode of transmission: Secondary | ICD-10-CM

## 2022-03-10 LAB — URINALYSIS, ROUTINE W REFLEX MICROSCOPIC
Bilirubin Urine: NEGATIVE
Glucose, UA: NEGATIVE mg/dL
Hgb urine dipstick: NEGATIVE
Leukocytes,Ua: NEGATIVE
Nitrite: POSITIVE — AB
Specific Gravity, Urine: 1.02 (ref 1.005–1.030)
pH: 6.5 (ref 5.0–8.0)

## 2022-03-10 LAB — URINALYSIS, MICROSCOPIC (REFLEX): RBC / HPF: NONE SEEN RBC/hpf (ref 0–5)

## 2022-03-10 LAB — WET PREP, GENITAL
Clue Cells Wet Prep HPF POC: NONE SEEN
Sperm: NONE SEEN
Trich, Wet Prep: NONE SEEN
WBC, Wet Prep HPF POC: <10 — AB (ref <10)

## 2022-03-10 LAB — HIV ANTIBODY (ROUTINE TESTING W REFLEX): HIV Screen 4th Generation wRfx: NONREACTIVE

## 2022-03-10 MED ORDER — FLUCONAZOLE 150 MG PO TABS: 150.0000 mg | ORAL_TABLET | Freq: Once | ORAL | 1 refills | Status: AC

## 2022-03-10 MED ORDER — CEPHALEXIN 500 MG PO CAPS: 500.0000 mg | ORAL_CAPSULE | Freq: Four times a day (QID) | ORAL | 0 refills | Status: AC

## 2022-03-10 NOTE — ED Provider Notes (Signed)
HPI  SUBJECTIVE:  Maureen Hoffman is a 36 y.o. female who presents with intermittent vaginal irritation described as burning discomfort for the past several days accompanied with dysuria, odorous urine.  She reports an hour long episode of left pelvic pain 2 days ago, this has since resolved and has not returned.  No urinary urgency, frequency, cloudy urine, hematuria, vomiting, fevers, abdominal, back pain.  No vaginal odor, bleeding, change in her baseline discharge.  No vaginal itching or genital rash.  She reports noticing a nonpainful "bump" on her left outer labia described as a ball underneath her skin this morning.  No expressible purulent drainage.  She has not shaved in 2 weeks.  She was with a new female sexual partner last month, who is asymptomatic.  They did not use protection.  She denies having any other partners.  She would like to be tested for STDs.  No antipyretic in the past 6 hours.  She tried ibuprofen and 4 doses of leftover amoxicillin without improvement in her symptoms.  No aggravating or alleviating factors.  She has a past medical history of BV, yeast, UTI, pyelonephritis, nephrolithiasis, Hashimoto's.  No history of diabetes, hypertension, STDs, MRSA.  LMP: 2 and half weeks ago.  Denies possibility of being pregnant.  PCP: [facility].   Past Medical History:  Diagnosis Date   Mood swings    Seizures (HCC)    as a child   Thrombocytopenia affecting pregnancy, antepartum (HCC) 12/03/2016   Thrombocytopenia complicating pregnancy (HCC)    2018   Thyroid disease     Past Surgical History:  Procedure Laterality Date   NO PAST SURGERIES      Family History  Problem Relation Age of Onset   Healthy Mother    Colon cancer Father    Hashimoto's thyroiditis Sister    Breast cancer Maternal Grandmother    Diabetes Maternal Grandmother    Breast cancer Paternal Grandmother    COPD Paternal Grandmother    Brain cancer Paternal Grandfather     Social  History   Tobacco Use   Smoking status: Former    Types: Cigarettes    Quit date: 07/13/2010    Years since quitting: 11.6   Smokeless tobacco: Never  Vaping Use   Vaping Use: Never used  Substance Use Topics   Alcohol use: Yes    Comment: rare   Drug use: No    No current facility-administered medications for this encounter.  Current Outpatient Medications:    buPROPion (WELLBUTRIN XL) 300 MG 24 hr tablet, Take 1 tablet (300 mg total) by mouth daily., Disp: 90 tablet, Rfl: 1   cephALEXin (KEFLEX) 500 MG capsule, Take 1 capsule (500 mg total) by mouth 4 (four) times daily for 7 days., Disp: 28 capsule, Rfl: 0   fluconazole (DIFLUCAN) 150 MG tablet, Take 1 tablet (150 mg total) by mouth once for 1 dose. 1 tab po x 1. May repeat in 72 hours if no improvement, Disp: 2 tablet, Rfl: 1   levothyroxine (SYNTHROID) 25 MCG tablet, TAKE 1 TABLET BY MOUTH EVERY DAY, Disp: 90 tablet, Rfl: 0   norgestimate-ethinyl estradiol (ORTHO-CYCLEN) 0.25-35 MG-MCG tablet, Take 1 tablet by mouth daily., Disp: 84 tablet, Rfl: 3   cyclobenzaprine (FLEXERIL) 10 MG tablet, TAKE 1 TABLET BY MOUTH THREE TIMES A DAY AS NEEDED FOR MUSCLE SPASMS, Disp: 30 tablet, Rfl: 0   dicyclomine (BENTYL) 20 MG tablet, Take 1 tablet (20 mg total) by mouth 2 (two) times daily., Disp:  20 tablet, Rfl: 0   fluticasone (FLONASE) 50 MCG/ACT nasal spray, Place 2 sprays into both nostrils daily., Disp: 16 g, Rfl: 6   gabapentin (NEURONTIN) 300 MG capsule, TAKE 1 CAPSULE (300 MG TOTAL) BY MOUTH 2 (TWO) TIMES DAILY. START TAKING 1 CAPSULE AT BEDTIME, IF NEEDED AFTER 3 DAYS CAN INCREASE TO TWICE A DAY, Disp: 60 capsule, Rfl: 1   meloxicam (MOBIC) 7.5 MG tablet, Take 1 tablet (7.5 mg total) by mouth daily., Disp: 30 tablet, Rfl: 0   sertraline (ZOLOFT) 25 MG tablet, TAKE 1 TAB DAILY FOR 2 WEEKS. INCREASE TO 2 TABS DAILY FOR WEEKS 2-4 AND THEN INCREASE TO 4 TABS., Disp: 360 tablet, Rfl: 0   vitamin C (ASCORBIC ACID) 250 MG tablet, Take 250 mg by  mouth daily., Disp: , Rfl:   No Known Allergies   ROS  As noted in HPI.   Physical Exam  BP (!) 128/98 (BP Location: Left Arm)   Pulse 86   SpO2 100%   Constitutional: Well developed, well nourished, no acute distress Eyes:  EOMI, conjunctiva normal bilaterally HENT: Normocephalic, atraumatic,mucus membranes moist Respiratory: Normal inspiratory effort Cardiovascular: Normal rate GI: nondistended soft.   Positive suprapubic tenderness.  No flank or other abdominal tenderness back: No CVA tenderness GU: 0.5 cm nontender mobile mass left outer labia.  Pustule purulent drainage.  Slight surrounding erythema.  Normal vaginal mucosa.  Normal os. Thin oderous   white vaginal discharge. No discharge.  Uterus smooth, NT. No CMT. No adnexal tenderness. No adnexal masses.  Chaperone present during exam skin: No rash, skin intact Musculoskeletal: no deformities Neurologic: Alert & oriented x 3, no focal neuro deficits Psychiatric: Speech and behavior appropriate   ED Course   Medications - No data to display  Orders Placed This Encounter  Procedures   Wet prep, genital    Standing Status:   Standing    Number of Occurrences:   1   Urine Culture    Standing Status:   Standing    Number of Occurrences:   1    Order Specific Question:   Indication    Answer:   Dysuria   Urinalysis, Routine w reflex microscopic    Standing Status:   Standing    Number of Occurrences:   1   HIV Antibody (routine testing w rflx)    Standing Status:   Standing    Number of Occurrences:   1   RPR    Standing Status:   Standing    Number of Occurrences:   1   Urinalysis, Microscopic (reflex)    Standing Status:   Standing    Number of Occurrences:   1    Results for orders placed or performed during the hospital encounter of 03/10/22 (from the past 24 hour(s))  Wet prep, genital     Status: Abnormal   Collection Time: 03/10/22  1:05 PM   Specimen: Vaginal  Result Value Ref Range   Yeast Wet  Prep HPF POC PRESENT (A) NONE SEEN   Trich, Wet Prep NONE SEEN NONE SEEN   Clue Cells Wet Prep HPF POC NONE SEEN NONE SEEN   WBC, Wet Prep HPF POC <10 (A) <10   Sperm NONE SEEN   Urinalysis, Routine w reflex microscopic Vaginal     Status: Abnormal   Collection Time: 03/10/22  1:27 PM  Result Value Ref Range   Color, Urine AMBER (A) YELLOW   APPearance CLEAR CLEAR   Specific Gravity, Urine 1.020  1.005 - 1.030   pH 6.5 5.0 - 8.0   Glucose, UA NEGATIVE NEGATIVE mg/dL   Hgb urine dipstick NEGATIVE NEGATIVE   Bilirubin Urine NEGATIVE NEGATIVE   Ketones, ur TRACE (A) NEGATIVE mg/dL   Protein, ur TRACE (A) NEGATIVE mg/dL   Nitrite POSITIVE (A) NEGATIVE   Leukocytes,Ua NEGATIVE NEGATIVE  Urinalysis, Microscopic (reflex)     Status: Abnormal   Collection Time: 03/10/22  1:27 PM  Result Value Ref Range   RBC / HPF NONE SEEN 0 - 5 RBC/hpf   WBC, UA 0-5 0 - 5 WBC/hpf   Bacteria, UA MANY (A) NONE SEEN   Squamous Epithelial / LPF 0-5 0 - 5   No results found.  ED Clinical Impression  1. Folliculitis   2. Acute cystitis without hematuria   3. Screening for STDs (sexually transmitted diseases)     ED Assessment/Plan  She has yeast on her wet prep.  She does not have BV or Trichomonas.  Her UA is positive for nitrites with many bacteria, suggestive of a urinary tract infection.  Sending urine off for culture to confirm antibiotic choice.  The mass on her labia appears to be an early folliculitis.  Will send home with Keflex 500 mg p.o. every 6 hours for 7 days which will cover a folliculitis in addition to UTI.  Advised warm compresses.  She also has BV.  Will send home with Flagyl. Sent off GC/chlamydia, HIV, RPR. Will not treat empirically now.. Advised pt to refrain from sexual contact until she  knows lab results, symptoms resolve, and partner(s) are treated if necessary. Pt provided working phone number. Follow-up with PCP as needed. Discussed labs, MDM, plan and followup with patient.  Pt agrees with plan.    Meds ordered this encounter  Medications   cephALEXin (KEFLEX) 500 MG capsule    Sig: Take 1 capsule (500 mg total) by mouth 4 (four) times daily for 7 days.    Dispense:  28 capsule    Refill:  0   fluconazole (DIFLUCAN) 150 MG tablet    Sig: Take 1 tablet (150 mg total) by mouth once for 1 dose. 1 tab po x 1. May repeat in 72 hours if no improvement    Dispense:  2 tablet    Refill:  1    *This clinic note was created using Scientist, clinical (histocompatibility and immunogenetics). Therefore, there may be occasional mistakes despite careful proofreading.  ?     Domenick Gong, MD 03/10/22 1426

## 2022-03-10 NOTE — Discharge Instructions (Addendum)
Your urinalysis came back consistent with a urinary tract infection.  I have sent it off for culture to make sure that we have you on the correct antibiotic.  Keflex will take care of a urinary tract infection in addition to a folliculitis/skin infection.  You also have a yeast infection.  You do not have BV or Trichomonas.  Your other labs will be back in several days.  We will contact you if they are positive.  Finish all of the antibiotics, even if you feel better.  Warm compresses to your labia.  Give [place] a working phone number so that we can contact you if needed. Refrain from sexual contact until all of your labs have come back, symptoms have resolved, and your partner(s) are treated if necessary. Return to the ER if you get worse, have a fever >100.4, or for any concerns.   Go to www.goodrx.com  or www.costplusdrugs.com to look up your medications. This will give you a list of where you can find your prescriptions at the most affordable prices. Or ask the pharmacist what the cash price is, or if they have any other discount programs available to help make your medication more affordable. This can be less expensive than what you would pay with insurance.

## 2022-03-10 NOTE — ED Triage Notes (Addendum)
Pt presents with vaginal irritation, some irritation after urination and odor. She would also like to be tested for an STD. Pt has taken 4 amoxicillin pills that she has left over

## 2022-03-11 LAB — RPR: RPR Ser Ql: NONREACTIVE

## 2022-03-11 LAB — CERVICOVAGINAL ANCILLARY ONLY
Chlamydia: NEGATIVE
Comment: NEGATIVE
Comment: NORMAL
Neisseria Gonorrhea: NEGATIVE

## 2022-03-12 ENCOUNTER — Telehealth (HOSPITAL_COMMUNITY): Payer: Self-pay | Admitting: Emergency Medicine

## 2022-03-12 LAB — URINE CULTURE: Culture: >=100000 — AB

## 2022-03-12 MED ORDER — NITROFURANTOIN MONOHYD MACRO 100 MG PO CAPS: 100.0000 mg | ORAL_CAPSULE | Freq: Two times a day (BID) | ORAL | 0 refills | Status: DC

## 2022-04-02 ENCOUNTER — Encounter: Payer: Self-pay | Admitting: Nurse Practitioner

## 2022-04-14 ENCOUNTER — Other Ambulatory Visit: Payer: Self-pay | Admitting: Nurse Practitioner

## 2022-04-15 NOTE — Telephone Encounter (Signed)
Requested medication (s) are due for refill today - yes  Requested medication (s) are on the active medication list -yes  Future visit scheduled -yes  Last refill: 01/15/22 #30  Notes to clinic: non delegated Rx  Requested Prescriptions  Pending Prescriptions Disp Refills   cyclobenzaprine (FLEXERIL) 10 MG tablet [Pharmacy Med Name: CYCLOBENZAPRINE 10 MG TABLET] 30 tablet 0    Sig: TAKE 1 TABLET BY MOUTH THREE TIMES A DAY AS NEEDED FOR MUSCLE SPASM     Not Delegated - Analgesics:  Muscle Relaxants Failed - 04/14/2022  4:06 PM      Failed - This refill cannot be delegated      Passed - Valid encounter within last 6 months    Recent Outpatient Visits           5 months ago Anxiety and depression   [facility] Jon Billings, NP   7 months ago Anxiety and depression   [facility] Jon Billings, NP   9 months ago Neck pain   [facility] McElwee, Lauren A, NP   11 months ago Anxiety and depression   [facility] McElwee, Lauren A, NP   1 year ago Fatigue, unspecified type   [employer], Jake Church, DO       Future Appointments             In 1 week Jon Billings, NP [place], [place]               Requested Prescriptions  Pending Prescriptions Disp Refills   cyclobenzaprine (FLEXERIL) 10 MG tablet [Pharmacy Med Name: CYCLOBENZAPRINE 10 MG TABLET] 30 tablet 0    Sig: TAKE 1 TABLET BY MOUTH THREE TIMES A DAY AS NEEDED FOR MUSCLE SPASM     Not Delegated - Analgesics:  Muscle Relaxants Failed - 04/14/2022  4:06 PM      Failed - This refill cannot be delegated      Passed - Valid encounter within last 6 months    Recent Outpatient Visits           5 months ago Anxiety and depression   [place], NP   7 months ago Anxiety and depression   [facility] Jon Billings, NP   9 months ago Neck pain   [facility]  McElwee, Lauren A, NP   11 months ago Anxiety and depression   [facility] McElwee, Lauren A, NP   1 year ago Fatigue, unspecified type   [place], Jake Church, DO       Future Appointments             In 1 week Jon Billings, NP [facility], [place]

## 2022-04-27 ENCOUNTER — Encounter: Payer: Self-pay | Admitting: Nurse Practitioner

## 2022-04-27 ENCOUNTER — Ambulatory Visit: Payer: Medicaid Other | Admitting: Nurse Practitioner

## 2022-04-27 VITALS — BP 140/98 | HR 87 | Temp 98.6°F | Wt 181.3 lb

## 2022-04-27 DIAGNOSIS — F419 Anxiety disorder, unspecified: Secondary | ICD-10-CM

## 2022-04-27 DIAGNOSIS — F32A Depression, unspecified: Secondary | ICD-10-CM

## 2022-04-27 DIAGNOSIS — E079 Disorder of thyroid, unspecified: Secondary | ICD-10-CM | POA: Diagnosis not present

## 2022-04-27 DIAGNOSIS — I10 Essential (primary) hypertension: Secondary | ICD-10-CM

## 2022-04-27 MED ORDER — SERTRALINE HCL 25 MG PO TABS: ORAL_TABLET | ORAL | 1 refills | Status: DC

## 2022-04-27 MED ORDER — NORGESTIMATE-ETH ESTRADIOL 0.25-35 MG-MCG PO TABS: 1.0000 | ORAL_TABLET | Freq: Every day | ORAL | 3 refills | Status: DC

## 2022-04-27 MED ORDER — BUPROPION HCL ER (XL) 300 MG PO TB24: 300.0000 mg | ORAL_TABLET | Freq: Every day | ORAL | 1 refills | Status: DC

## 2022-04-27 MED ORDER — GABAPENTIN 300 MG PO CAPS: 300.0000 mg | ORAL_CAPSULE | Freq: Every day | ORAL | 1 refills | Status: DC

## 2022-04-27 MED ORDER — LEVOTHYROXINE SODIUM 25 MCG PO TABS: 25.0000 ug | ORAL_TABLET | Freq: Every day | ORAL | 1 refills | Status: DC

## 2022-04-27 MED ORDER — CYCLOBENZAPRINE HCL 10 MG PO TABS: ORAL_TABLET | ORAL | 0 refills | Status: DC

## 2022-04-27 MED ORDER — POLYMYXIN B-TRIMETHOPRIM 10000-0.1 UNIT/ML-% OP SOLN: 1.0000 [drp] | Freq: Four times a day (QID) | OPHTHALMIC | 0 refills | Status: DC

## 2022-04-27 NOTE — Assessment & Plan Note (Signed)
Chronic.  Controlled.  Continue with current medication regimen of Wellbutrin 300mg  and Zoloft 25mg .  Refill sent today.  Labs ordered today.  Return to clinic in 6 months for reevaluation.  Call sooner if concerns arise.

## 2022-04-27 NOTE — Assessment & Plan Note (Signed)
Chronic. Not well controlled.  Patient not feeling well today.  Follow up in 2 months.  Call sooner if concerns arise.

## 2022-04-27 NOTE — Assessment & Plan Note (Signed)
Chronic. Most recent TSH and T4 are within normal range.  Continue with current medication regimen.  Refill sent today.  Follow up in 6 months.

## 2022-04-27 NOTE — Progress Notes (Signed)
Established Patient Office Visit  Subjective:  Patient ID: Maureen Hoffman, female    DOB: Dec 26, 1985  Age: 36 y.o. MRN: [id number]  CC:  Chief Complaint  Patient presents with   Depression   Anxiety    6 month follow up    Eye Problem    Recent contact with someone who had pink eye c/o swollen eyelids, crusty eyelids.    Cough    Onset 3 days ago.    Medication Refill    Patient would like refill on maintenance meds      HPI   ANXIETY/DEPRESSION Patient states she is still taking the Wellbutrin 328m.  She is also taking the Zoloft.  Feels like they are working okay.  Does not want to increase the dose of the Zoloft because she had really bad sweating episodes when the dose was highter in the past.  the new anxiety medication that she took did not help her anxiety.  It felt like she wasn't even taking anything.  States she took it for about 3 weeks then stopped taking it.  She is still taking the Wellbutrin 156mdaily.  Patient states she doesn't take the Buspar very often because it makes her very sleepy.  Patient states she would like to go back on the Zoloft.        04/27/2022    1:47 PM 10/29/2021    9:52 AM 09/10/2021    2:11 PM 06/22/2021    1:25 PM 05/07/2021    3:18 PM  Depression screen PHQ 2/9  Decreased Interest '1 2 3 2 3  ' Down, Depressed, Hopeless '1 3 3 1 2  ' PHQ - 2 Score '2 5 6 3 5  ' Altered sleeping '1 3 3 1 2  ' Tired, decreased energy '2 3 3 3 3  ' Change in appetite '2 3 3 2 3  ' Feeling bad or failure about yourself  '1 3 3 ' 0 3  Trouble concentrating '1 3 1 ' 0 1  Moving slowly or fidgety/restless 0 1 0 0 0  Suicidal thoughts 0 0 0 0 0  PHQ-9 Score '9 21 19 9 17  ' Difficult doing work/chores Very difficult Extremely dIfficult Very difficult        04/27/2022    1:47 PM 10/29/2021    9:53 AM 09/10/2021    2:11 PM 06/22/2021    1:25 PM  GAD 7 : Generalized Anxiety Score  Nervous, Anxious, on Edge '1 3 3 3  ' Control/stop worrying '1 3 2 ' 0  Worry too much - different  things '1 3 3 1  ' Trouble relaxing '1 3 3 3  ' Restless '1 1 3 1  ' Easily annoyed or irritable '2 3 3 3  ' Afraid - awful might happen 0 2 1 0  Total GAD 7 Score '7 18 18 11  ' Anxiety Difficulty Very difficult Extremely difficult Extremely difficult Very difficult   HYPOTHYROIDISM Thyroid control status:controlled Satisfied with current treatment? yes Medication side effects: no Medication compliance: excellent compliance Etiology of hypothyroidism:  Recent dose adjustment:no Fatigue: yes Cold intolerance: yes Heat intolerance: no Weight gain: no Weight loss: no Constipation: no Diarrhea/loose stools: yes Palpitations: yes Lower extremity edema: no Anxiety/depressed mood: yes  UPPER RESPIRATORY TRACT INFECTION Negative home COVID Worst symptom: coughing x 3 days Fever: no Cough: yes Shortness of breath: no Wheezing: no Chest pain: no Chest tightness: no Chest congestion: no Nasal congestion: yes Runny nose: yes Post nasal drip: yes Sneezing: yes Sore throat: yes Swollen glands: yes Sinus  pressure: no Headache: no Face pain: no Toothache: no Ear pain: no bilateral Ear pressure: yes left Eyes red/itching:yes Eye drainage/crusting: yes  Vomiting: no Rash: no Fatigue: yes Sick contacts: no Strep contacts: no  Context: worse Recurrent sinusitis: no Relief with OTC cold/cough medications: yes  Treatments attempted: cold/sinus     Past Medical History:  Diagnosis Date   Mood swings    Seizures (HCC)    as a child   Thrombocytopenia affecting pregnancy, antepartum ([place]) 12/03/2016   Thrombocytopenia complicating pregnancy ([place])    2018   Thyroid disease     Past Surgical History:  Procedure Laterality Date   NO PAST SURGERIES      Family History  Problem Relation Age of Onset   Healthy Mother    Colon cancer Father    Hashimoto's thyroiditis Sister    Breast cancer Maternal Grandmother    Diabetes Maternal Grandmother    Breast cancer Paternal  Grandmother    COPD Paternal Grandmother    Brain cancer Paternal Grandfather     Social History   Socioeconomic History   Marital status: Single    Spouse name: Not on file   Number of children: Not on file   Years of education: Not on file   Highest education level: Not on file  Occupational History   Not on file  Tobacco Use   Smoking status: Former    Types: Cigarettes    Quit date: 07/13/2010    Years since quitting: 11.7   Smokeless tobacco: Never  Vaping Use   Vaping Use: Never used  Substance and Sexual Activity   Alcohol use: Yes    Comment: rare   Drug use: No   Sexual activity: Yes    Birth control/protection: Pill  Other Topics Concern   Not on file  Social History Narrative   Not on file   Social Determinants of Health   Financial Resource Strain: Not on file  Food Insecurity: Not on file  Transportation Needs: Not on file  Physical Activity: Not on file  Stress: Not on file  Social Connections: Not on file  Intimate Partner Violence: Not on file    Outpatient Medications Prior to Visit  Medication Sig Dispense Refill   fluticasone (FLONASE) 50 MCG/ACT nasal spray Place 2 sprays into both nostrils daily. 16 g 6   vitamin C (ASCORBIC ACID) 250 MG tablet Take 250 mg by mouth daily.     buPROPion (WELLBUTRIN XL) 300 MG 24 hr tablet Take 1 tablet (300 mg total) by mouth daily. 90 tablet 1   gabapentin (NEURONTIN) 300 MG capsule TAKE 1 CAPSULE (300 MG TOTAL) BY MOUTH 2 (TWO) TIMES DAILY. START TAKING 1 CAPSULE AT BEDTIME, IF NEEDED AFTER 3 DAYS CAN INCREASE TO TWICE A DAY 60 capsule 1   levothyroxine (SYNTHROID) 25 MCG tablet TAKE 1 TABLET BY MOUTH EVERY DAY 90 tablet 0   norgestimate-ethinyl estradiol (ORTHO-CYCLEN) 0.25-35 MG-MCG tablet Take 1 tablet by mouth daily. 84 tablet 3   sertraline (ZOLOFT) 25 MG tablet TAKE 1 TAB DAILY FOR 2 WEEKS. INCREASE TO 2 TABS DAILY FOR WEEKS 2-4 AND THEN INCREASE TO 4 TABS. 360 tablet 0   cyclobenzaprine (FLEXERIL) 10  MG tablet TAKE 1 TABLET BY MOUTH THREE TIMES A DAY AS NEEDED FOR MUSCLE SPASMS (Patient not taking: Reported on 04/27/2022) 30 tablet 0   dicyclomine (BENTYL) 20 MG tablet Take 1 tablet (20 mg total) by mouth 2 (two) times daily. (Patient not taking: Reported on  04/27/2022) 20 tablet 0   meloxicam (MOBIC) 7.5 MG tablet Take 1 tablet (7.5 mg total) by mouth daily. (Patient not taking: Reported on 04/27/2022) 30 tablet 0   nitrofurantoin, macrocrystal-monohydrate, (MACROBID) 100 MG capsule Take 1 capsule (100 mg total) by mouth 2 (two) times daily. (Patient not taking: Reported on 04/27/2022) 10 capsule 0   No facility-administered medications prior to visit.    No Known Allergies  ROS Review of Systems  Constitutional:  Positive for fatigue.  Respiratory: Negative.    Cardiovascular: Negative.   Gastrointestinal: Negative.   Genitourinary: Negative.   Musculoskeletal:  Positive for neck pain and neck stiffness.  Skin: Negative.   Neurological: Negative.   Psychiatric/Behavioral:  Positive for dysphoric mood. Negative for suicidal ideas. The patient is nervous/anxious.       Objective:    Physical Exam Vitals and nursing note reviewed.  Constitutional:      General: She is not in acute distress.    Appearance: Normal appearance.  HENT:     Head: Normocephalic and atraumatic.     Right Ear: A middle ear effusion is present.     Left Ear: A middle ear effusion is present.     Nose: Congestion and rhinorrhea present.     Right Sinus: No maxillary sinus tenderness or frontal sinus tenderness.     Left Sinus: No maxillary sinus tenderness or frontal sinus tenderness.     Mouth/Throat:     Pharynx: Posterior oropharyngeal erythema present. No oropharyngeal exudate.  Eyes:     Conjunctiva/sclera: Conjunctivae normal.  Cardiovascular:     Rate and Rhythm: Normal rate and regular rhythm.     Pulses: Normal pulses.     Heart sounds: Normal heart sounds.  Pulmonary:     Effort:  Pulmonary effort is normal.     Breath sounds: Normal breath sounds.  Musculoskeletal:        General: Tenderness: cervical spine, top of right shoulder.     Cervical back: No signs of trauma. Pain with movement and muscular tenderness present. Decreased range of motion.     Comments: Pain with empty can test and spurling test  Skin:    General: Skin is warm and dry.  Neurological:     General: No focal deficit present.     Mental Status: She is alert and oriented to person, place, and time.  Psychiatric:        Mood and Affect: Mood normal.        Behavior: Behavior normal.        Thought Content: Thought content normal.        Judgment: Judgment normal.     BP (!) 140/98   Pulse 87   Temp 98.6 F (37 C) (Oral)   Wt 181 lb 4.8 oz (82.2 kg)   LMP 04/21/2022 (Approximate)   SpO2 98%   BMI 32.12 kg/m  Wt Readings from Last 3 Encounters:  04/27/22 181 lb 4.8 oz (82.2 kg)  02/02/22 180 lb (81.6 kg)  10/29/21 188 lb (85.3 kg)     Health Maintenance Due  Topic Date Due   COVID-19 Vaccine (2 - Pfizer risk series) 06/16/2020     There are no preventive care reminders to display for this patient.  Lab Results  Component Value Date   TSH 4.240 09/10/2021   Lab Results  Component Value Date   WBC 10.6 (H) 02/02/2022   HGB 12.2 02/02/2022   HCT 36.5 02/02/2022   MCV 85.1 02/02/2022  PLT 159 02/02/2022   Lab Results  Component Value Date   NA 136 02/02/2022   K 3.7 02/02/2022   CO2 24 02/02/2022   GLUCOSE 78 02/02/2022   BUN 10 02/02/2022   CREATININE 0.80 02/02/2022   BILITOT 0.4 02/02/2022   ALKPHOS 86 02/02/2022   AST 17 02/02/2022   ALT 19 02/02/2022   PROT 8.1 02/02/2022   ALBUMIN 3.9 02/02/2022   CALCIUM 8.9 02/02/2022   ANIONGAP 9 02/02/2022   EGFR 92 09/10/2021   Lab Results  Component Value Date   CHOL 214 (H) 01/22/2020   Lab Results  Component Value Date   HDL 62 01/22/2020   Lab Results  Component Value Date   LDLCALC 118 (H)  01/22/2020   Lab Results  Component Value Date   TRIG 199 (H) 01/22/2020   No results found for: "CHOLHDL" No results found for: "HGBA1C"    Assessment & Plan:   Problem List Items Addressed This Visit       Cardiovascular and Mediastinum   Hypertension - Primary    Chronic. Not well controlled.  Patient not feeling well today.  Follow up in 2 months.  Call sooner if concerns arise.       Relevant Orders   Comp Met (CMET)     Endocrine   Thyroid disease    Chronic. Most recent TSH and T4 are within normal range.  Continue with current medication regimen.  Refill sent today.  Follow up in 6 months.       Relevant Medications   levothyroxine (SYNTHROID) 25 MCG tablet   Other Relevant Orders   TSH   T4, free     Other   Anxiety and depression    Chronic.  Controlled.  Continue with current medication regimen of Wellbutrin 331m and Zoloft 276m  Refill sent today.  Labs ordered today.  Return to clinic in 6 months for reevaluation.  Call sooner if concerns arise.        Relevant Medications   buPROPion (WELLBUTRIN XL) 300 MG 24 hr tablet   sertraline (ZOLOFT) 25 MG tablet    Meds ordered this encounter  Medications   buPROPion (WELLBUTRIN XL) 300 MG 24 hr tablet    Sig: Take 1 tablet (300 mg total) by mouth daily.    Dispense:  90 tablet    Refill:  1    Order Specific Question:   Supervising Provider    Answer:   JOValerie Roys1[[id number]] gabapentin (NEURONTIN) 300 MG capsule    Sig: Take 1 capsule (300 mg total) by mouth at bedtime. Start taking 1 capsule at bedtime, if needed after 3 days can increase to twice a day    Dispense:  90 capsule    Refill:  1    Order Specific Question:   Supervising Provider    Answer:   JOValerie Roys1[[id number]] levothyroxine (SYNTHROID) 25 MCG tablet    Sig: Take 1 tablet (25 mcg total) by mouth daily.    Dispense:  90 tablet    Refill:  1    Order Specific Question:   Supervising Provider    Answer:   JOValerie Roys1[[id number]] norgestimate-ethinyl estradiol (ORTHO-CYCLEN) 0.25-35 MG-MCG tablet    Sig: Take 1 tablet by mouth daily.    Dispense:  84 tablet    Refill:  3    Order Specific Question:   Supervising Provider    Answer:   JOWynetta Emery  MEGAN P [[id number]]   sertraline (ZOLOFT) 25 MG tablet    Sig: Take 1 tablet daily    Dispense:  90 tablet    Refill:  1    Order Specific Question:   Supervising Provider    Answer:   Valerie Roys [[id number]]   cyclobenzaprine (FLEXERIL) 10 MG tablet    Sig: TAKE 1 TABLET BY MOUTH THREE TIMES A DAY AS NEEDED FOR MUSCLE SPASMS    Dispense:  30 tablet    Refill:  0    Order Specific Question:   Supervising Provider    Answer:   Valerie Roys [[id number]]   trimethoprim-polymyxin b (POLYTRIM) ophthalmic solution    Sig: Place 1 drop into both eyes every 6 (six) hours.    Dispense:  10 mL    Refill:  0    Order Specific Question:   Supervising Provider    Answer:   Valerie Roys [[id number]]     Follow-up: Return in about 2 months (around 06/27/2022) for BP Check.    Jon Billings, NP

## 2022-04-28 LAB — COMPREHENSIVE METABOLIC PANEL
ALT: 9 IU/L (ref 0–32)
AST: 12 IU/L (ref 0–40)
Albumin/Globulin Ratio: 1.3 (ref 1.2–2.2)
Albumin: 3.9 g/dL (ref 3.9–4.9)
Alkaline Phosphatase: 76 IU/L (ref 44–121)
BUN/Creatinine Ratio: 18 (ref 9–23)
BUN: 12 mg/dL (ref 6–20)
Bilirubin Total: 0.2 mg/dL (ref 0.0–1.2)
CO2: 21 mmol/L (ref 20–29)
Calcium: 8.7 mg/dL (ref 8.7–10.2)
Chloride: 103 mmol/L (ref 96–106)
Creatinine, Ser: 0.68 mg/dL (ref 0.57–1.00)
Globulin, Total: 2.9 g/dL (ref 1.5–4.5)
Glucose: 113 mg/dL — ABNORMAL HIGH (ref 70–99)
Potassium: 3.6 mmol/L (ref 3.5–5.2)
Sodium: 139 mmol/L (ref 134–144)
Total Protein: 6.8 g/dL (ref 6.0–8.5)
eGFR: 116 mL/min/{1.73_m2} (ref >59)

## 2022-04-28 LAB — TSH: TSH: 5.5 u[IU]/mL — ABNORMAL HIGH (ref 0.450–4.500)

## 2022-04-28 LAB — T4, FREE: Free T4: 0.87 ng/dL (ref 0.82–1.77)

## 2022-04-28 MED ORDER — LEVOTHYROXINE SODIUM 50 MCG PO TABS: 50.0000 ug | ORAL_TABLET | Freq: Every day | ORAL | 1 refills | Status: AC

## 2022-04-28 NOTE — Addendum Note (Signed)
Addended by: Jon Billings on: 04/28/2022 08:42 AM   Modules accepted: Orders

## 2022-04-28 NOTE — Progress Notes (Signed)
Hi Maureen Hoffman. It was good to see you yesterday.  Your lab work shows that your thyroid is elevated.  I increased your dose to 60mcg daily.  Your glucose was a little bit high so we will recheck that at our next visit.  No other concerns at this time.  Follow up as discussed.

## 2022-04-30 ENCOUNTER — Ambulatory Visit: Payer: Medicaid Other | Admitting: Nurse Practitioner

## 2022-06-28 ENCOUNTER — Ambulatory Visit: Payer: Medicaid Other | Admitting: Nurse Practitioner

## 2022-06-28 NOTE — Progress Notes (Deleted)
Established Patient Office Visit  Subjective:  Patient ID: Maureen Hoffman, female    DOB: 1985/10/24  Age: 36 y.o. MRN: [id number]  CC:  No chief complaint on file.    HPI   ANXIETY/DEPRESSION Patient states she is still taking the Wellbutrin 327m.  She is also taking the Zoloft.  Feels like they are working okay.  Does not want to increase the dose of the Zoloft because she had really bad sweating episodes when the dose was highter in the past.  the new anxiety medication that she took did not help her anxiety.  It felt like she wasn't even taking anything.  States she took it for about 3 weeks then stopped taking it.  She is still taking the Wellbutrin 159mdaily.  Patient states she doesn't take the Buspar very often because it makes her very sleepy.  Patient states she would like to go back on the Zoloft.        04/27/2022    1:47 PM 10/29/2021    9:52 AM 09/10/2021    2:11 PM 06/22/2021    1:25 PM 05/07/2021    3:18 PM  Depression screen PHQ 2/9  Decreased Interest _0 Down, Depressed, Hopeless _1 PHQ - 2 Score _2 Altered sleeping _3 Tired, decreased energy _4 Change in appetite _5 Feeling bad or failure about yourself  _6 0 3  Trouble concentrating _7 0 1  Moving slowly or fidgety/restless 0 1 0 0 0  Suicidal thoughts 0 0 0 0 0  PHQ-9 Score _8 Difficult doing work/chores Very difficult Extremely dIfficult Very difficult        04/27/2022    1:47 PM 10/29/2021    9:53 AM 09/10/2021    2:11 PM 06/22/2021    1:25 PM  GAD 7 : Generalized Anxiety Score  Nervous, Anxious, on Edge _9 Control/stop worrying _10 0  Worry too much - different things _11 Trouble relaxing _12 Restless _13 Easily annoyed or irritable _14 Afraid - awful might happen 0 2 1 0  Total GAD 7 Score _15 Anxiety Difficulty Very difficult Extremely difficult Extremely difficult Very difficult    HYPOTHYROIDISM Thyroid control status:controlled Satisfied with current treatment? yes Medication side effects: no Medication compliance: excellent compliance Etiology of hypothyroidism:  Recent dose adjustment:no Fatigue: yes Cold intolerance: yes Heat intolerance: no Weight gain: no Weight loss: no Constipation: no Diarrhea/loose stools: yes Palpitations: yes Lower extremity edema: no Anxiety/depressed mood: yes    Past Medical History:  Diagnosis Date   Mood swings    Seizures (HCC)    as a child   [place]   [place]   [place]   Thyroid disease     Past Surgical History:  Procedure Laterality Date   NO PAST SURGERIES      Family History  Problem Relation Age of Onset   Healthy Mother    Colon cancer Father    Hashimoto's thyroiditis Sister    Breast cancer Maternal Grandmother    Diabetes Maternal Grandmother    Breast cancer Paternal Grandmother    COPD  Paternal Grandmother    Brain cancer Paternal Grandfather     Social History   Socioeconomic History   Marital status: Single    Spouse name: Not on file   Number of children: Not on file   Years of education: Not on file   Highest education level: Not on file  Occupational History   Not on file  Tobacco Use   Smoking status: Former    Types: Cigarettes    Quit date: 07/13/2010    Years since quitting: 11.9   Smokeless tobacco: Never  Vaping Use   Vaping Use: Never used  Substance and Sexual Activity   Alcohol use: Yes    Comment: rare   Drug use: No   Sexual activity: Yes    Birth control/protection: Pill  Other Topics Concern   Not on file  Social History Narrative   Not on file   Social Determinants of Health   Financial Resource Strain: Not on file  Food Insecurity: Not on file  Transportation Needs: Not on file  Physical Activity: Not on file  Stress: Not on file  Social  Connections: Not on file  Intimate Partner Violence: Not on file    Outpatient Medications Prior to Visit  Medication Sig Dispense Refill   buPROPion (WELLBUTRIN XL) 300 MG 24 hr tablet Take 1 tablet (300 mg total) by mouth daily. 90 tablet 1   cyclobenzaprine (FLEXERIL) 10 MG tablet TAKE 1 TABLET BY MOUTH THREE TIMES A DAY AS NEEDED FOR MUSCLE SPASMS 30 tablet 0   fluticasone (FLONASE) 50 MCG/ACT nasal spray Place 2 sprays into both nostrils daily. 16 g 6   gabapentin (NEURONTIN) 300 MG capsule Take 1 capsule (300 mg total) by mouth at bedtime. Start taking 1 capsule at bedtime, if needed after 3 days can increase to twice a day 90 capsule 1   levothyroxine (SYNTHROID) 50 MCG tablet Take 1 tablet (50 mcg total) by mouth daily. 90 tablet 1   norgestimate-ethinyl estradiol (ORTHO-CYCLEN) 0.25-35 MG-MCG tablet Take 1 tablet by mouth daily. 84 tablet 3   sertraline (ZOLOFT) 25 MG tablet Take 1 tablet daily 90 tablet 1   trimethoprim-polymyxin b (POLYTRIM) ophthalmic solution Place 1 drop into both eyes every 6 (six) hours. 10 mL 0   vitamin C (ASCORBIC ACID) 250 MG tablet Take 250 mg by mouth daily.     No facility-administered medications prior to visit.    No Known Allergies  ROS Review of Systems  Constitutional:  Positive for fatigue.  Respiratory: Negative.    Cardiovascular: Negative.   Gastrointestinal: Negative.   Genitourinary: Negative.   Musculoskeletal:  Positive for neck pain and neck stiffness.  Skin: Negative.   Neurological: Negative.   Psychiatric/Behavioral:  Positive for dysphoric mood. Negative for suicidal ideas. The patient is nervous/anxious.       Objective:    Physical Exam Vitals and nursing note reviewed.  Constitutional:      General: She is not in acute distress.    Appearance: Normal appearance.  HENT:     Head: Normocephalic and atraumatic.     Right Ear: A middle ear effusion is present.     Left Ear: A middle ear effusion is present.      Nose: Congestion and rhinorrhea present.     Right Sinus: No maxillary sinus tenderness or frontal sinus tenderness.     Left Sinus: No maxillary sinus tenderness or frontal sinus tenderness.     Mouth/Throat:     Pharynx: Posterior  oropharyngeal erythema present. No oropharyngeal exudate.  Eyes:     Conjunctiva/sclera: Conjunctivae normal.  Cardiovascular:     Rate and Rhythm: Normal rate and regular rhythm.     Pulses: Normal pulses.     Heart sounds: Normal heart sounds.  Pulmonary:     Effort: Pulmonary effort is normal.     Breath sounds: Normal breath sounds.  Musculoskeletal:        General: Tenderness: cervical spine, top of right shoulder.     Cervical back: No signs of trauma. Pain with movement and muscular tenderness present. Decreased range of motion.     Comments: Pain with empty can test and spurling test  Skin:    General: Skin is warm and dry.  Neurological:     General: No focal deficit present.     Mental Status: She is alert and oriented to person, place, and time.  Psychiatric:        Mood and Affect: Mood normal.        Behavior: Behavior normal.        Thought Content: Thought content normal.        Judgment: Judgment normal.     There were no vitals taken for this visit. Wt Readings from Last 3 Encounters:  04/27/22 181 lb 4.8 oz (82.2 kg)  02/02/22 180 lb (81.6 kg)  10/29/21 188 lb (85.3 kg)     Health Maintenance Due  Topic Date Due   COVID-19 Vaccine (2 - Pfizer risk series) 06/16/2020     There are no preventive care reminders to display for this patient.  Lab Results  Component Value Date   TSH 5.500 (H) 04/27/2022   Lab Results  Component Value Date   WBC 10.6 (H) 02/02/2022   HGB 12.2 02/02/2022   HCT 36.5 02/02/2022   MCV 85.1 02/02/2022   PLT 159 02/02/2022   Lab Results  Component Value Date   NA 139 04/27/2022   K 3.6 04/27/2022   CO2 21 04/27/2022   GLUCOSE 113 (H) 04/27/2022   BUN 12 04/27/2022   CREATININE 0.68  04/27/2022   BILITOT 0.2 04/27/2022   ALKPHOS 76 04/27/2022   AST 12 04/27/2022   ALT 9 04/27/2022   PROT 6.8 04/27/2022   ALBUMIN 3.9 04/27/2022   CALCIUM 8.7 04/27/2022   ANIONGAP 9 02/02/2022   EGFR 116 04/27/2022   Lab Results  Component Value Date   CHOL 214 (H) 01/22/2020   Lab Results  Component Value Date   HDL 62 01/22/2020   Lab Results  Component Value Date   LDLCALC 118 (H) 01/22/2020   Lab Results  Component Value Date   TRIG 199 (H) 01/22/2020   No results found for: "CHOLHDL" No results found for: "HGBA1C"    Assessment & Plan:   Problem List Items Addressed This Visit       Cardiovascular and Mediastinum   Hypertension - Primary     Other   Anxiety and depression    No orders of the defined types were placed in this encounter.    Follow-up: No follow-ups on file.    Jon Billings, NP

## 2022-07-21 ENCOUNTER — Encounter: Payer: Self-pay | Admitting: Nurse Practitioner

## 2022-08-06 ENCOUNTER — Other Ambulatory Visit: Payer: Self-pay | Admitting: Nurse Practitioner

## 2022-09-08 ENCOUNTER — Ambulatory Visit: Payer: Medicaid Other | Admitting: Family Medicine

## 2022-09-08 ENCOUNTER — Ambulatory Visit
Admission: RE | Admit: 2022-09-08 | Discharge: 2022-09-08 | Disposition: A | Discharge status: Home / Self Care | Payer: Medicaid Other | Attending: Family Medicine | Admitting: Family Medicine

## 2022-09-08 ENCOUNTER — Ambulatory Visit
Admission: RE | Admit: 2022-09-08 | Discharge: 2022-09-08 | Disposition: A | Discharge status: Home / Self Care | Payer: Medicaid Other | Source: Ambulatory Visit | Attending: Family Medicine | Admitting: Family Medicine

## 2022-09-08 ENCOUNTER — Encounter: Payer: Self-pay | Admitting: Family Medicine

## 2022-09-08 VITALS — BP 148/110 | HR 86 | Ht 63.0 in | Wt 173.4 lb

## 2022-09-08 DIAGNOSIS — M5412 Radiculopathy, cervical region: Secondary | ICD-10-CM | POA: Diagnosis not present

## 2022-09-08 DIAGNOSIS — M542 Cervicalgia: Secondary | ICD-10-CM | POA: Diagnosis not present

## 2022-09-08 DIAGNOSIS — F419 Anxiety disorder, unspecified: Secondary | ICD-10-CM | POA: Diagnosis not present

## 2022-09-08 DIAGNOSIS — E079 Disorder of thyroid, unspecified: Secondary | ICD-10-CM | POA: Diagnosis not present

## 2022-09-08 DIAGNOSIS — F32A Depression, unspecified: Secondary | ICD-10-CM

## 2022-09-08 DIAGNOSIS — I10 Essential (primary) hypertension: Secondary | ICD-10-CM

## 2022-09-08 MED ORDER — TRAZODONE HCL 50 MG PO TABS: 25.0000 mg | ORAL_TABLET | Freq: Every evening | ORAL | 1 refills | Status: DC | PRN

## 2022-09-08 MED ORDER — DULOXETINE HCL 30 MG PO CPEP: ORAL_CAPSULE | ORAL | 0 refills | Status: DC

## 2022-09-08 MED ORDER — HYDROXYZINE PAMOATE 25 MG PO CAPS: 25.0000 mg | ORAL_CAPSULE | Freq: Three times a day (TID) | ORAL | 1 refills | Status: DC | PRN

## 2022-09-08 MED ORDER — HYDROCHLOROTHIAZIDE 25 MG PO TABS: 25.0000 mg | ORAL_TABLET | Freq: Every day | ORAL | 1 refills | Status: DC

## 2022-09-08 NOTE — Patient Instructions (Signed)
-   Stop sertraline - After 1 week start duloxetine (Cymbalta) and take per Rx instructions - Start blood pressure medication - Can use hydroxyzine as-needed for anxiety attacks - Can use trazodone nightly for sleep - Referral coordinator will contact you for scheduling visits with PT and endocrinology - Return in 2 months

## 2022-09-14 ENCOUNTER — Encounter: Payer: Self-pay | Admitting: Family Medicine

## 2022-09-14 NOTE — Assessment & Plan Note (Signed)
Chronic condition, affecting ADLs, PHQ and GAD scores reviewed with patient.  Both pharmacologic and nonpharmacologic treatment strategies reviewed and she is amenable to initiation of medications.  Can utilize trazodone for as needed sleep, initiate duloxetine and continue Wellbutrin.  As needed hydroxyzine over the interim advised.

## 2022-09-14 NOTE — Assessment & Plan Note (Signed)
Chronic condition involving left upper extremity, affecting ADLs.  Examination with tenderness throughout the paraspinal left greater than right musculature, equivocal left positive Spurling's on the left with otherwise intact sensorimotor function in the bilateral upper extremities.  Plan for dedicated cervical spine x-rays, from a medication management standpoint utilize gabapentin, duloxetine initiation with titration to 60 mg after 2 weeks.  She is to start formal physical therapy as well, referral placed today.

## 2022-09-14 NOTE — Assessment & Plan Note (Signed)
Is amenable to seek endocrinology input for optimization of her Synthroid regimen.

## 2022-09-14 NOTE — Progress Notes (Signed)
Primary Care / Sports Medicine Office Visit  Patient Information:  Patient ID: Maureen Hoffman, female DOB: 1986/06/23 Age: 37 y.o. MRN: [id number]   Maureen Hoffman is a pleasant 37 y.o. female presenting with the following:  Chief Complaint  Patient presents with   Establish Care    Vitals:   09/08/22 0837 09/08/22 0846  BP: (!) 148/100 (!) 148/110  Pulse: 86   SpO2: 99%    Vitals:   09/08/22 0837  Weight: 173 lb 6.4 oz (78.7 kg)  Height: 5' 3"$  (1.6 m)   Body mass index is 30.72 kg/m.     Independent interpretation of notes and tests performed by another provider:   None  Procedures performed:   None  Pertinent History, Exam, Impression, and Recommendations:   [name] was seen today for establish care.  Anxiety and depression Assessment & Plan: Chronic condition, affecting ADLs, PHQ and GAD scores reviewed with patient.  Both pharmacologic and nonpharmacologic treatment strategies reviewed and she is amenable to initiation of medications.  Can utilize trazodone for as needed sleep, initiate duloxetine and continue Wellbutrin.  As needed hydroxyzine over the interim advised.   Thyroid disease Assessment & Plan: Is amenable to seek endocrinology input for optimization of her Synthroid regimen.  Orders: -     Ambulatory referral to Endocrinology  Primary hypertension -     hydroCHLOROthiazide; Take 1 tablet (25 mg total) by mouth daily.  Dispense: 30 tablet; Refill: 1  Left cervical radiculopathy Assessment & Plan: Chronic condition involving left upper extremity, affecting ADLs.  Examination with tenderness throughout the paraspinal left greater than right musculature, equivocal left positive Spurling's on the left with otherwise intact sensorimotor function in the bilateral upper extremities.  Plan for dedicated cervical spine x-rays, from a medication management standpoint utilize gabapentin, duloxetine initiation with titration to 60 mg after 2  weeks.  She is to start formal physical therapy as well, referral placed today.  Orders: -     Ambulatory referral to Physical Therapy -     DG Cervical Spine Complete; Future  Other orders -     DULoxetine HCl; Take 1 capsule (30 mg total) by mouth every evening for 14 days, THEN 2 capsules (60 mg total) every evening.  Dispense: 106 capsule; Refill: 0 -     traZODone HCl; Take 0.5-1 tablets (25-50 mg total) by mouth at bedtime as needed for sleep.  Dispense: 30 tablet; Refill: 1 -     hydrOXYzine Pamoate; Take 1 capsule (25 mg total) by mouth every 8 (eight) hours as needed for anxiety.  Dispense: 30 capsule; Refill: 1     Orders & Medications Meds ordered this encounter  Medications   DULoxetine (CYMBALTA) 30 MG capsule    Sig: Take 1 capsule (30 mg total) by mouth every evening for 14 days, THEN 2 capsules (60 mg total) every evening.    Dispense:  106 capsule    Refill:  0   traZODone (DESYREL) 50 MG tablet    Sig: Take 0.5-1 tablets (25-50 mg total) by mouth at bedtime as needed for sleep.    Dispense:  30 tablet    Refill:  1   hydrOXYzine (VISTARIL) 25 MG capsule    Sig: Take 1 capsule (25 mg total) by mouth every 8 (eight) hours as needed for anxiety.    Dispense:  30 capsule    Refill:  1   hydrochlorothiazide (HYDRODIURIL) 25 MG tablet    Sig: Take 1  tablet (25 mg total) by mouth daily.    Dispense:  30 tablet    Refill:  1   Orders Placed This Encounter  Procedures   DG Cervical Spine Complete   Ambulatory referral to Endocrinology   Ambulatory referral to Physical Therapy     Return in about 2 months (around 11/07/2022).     Montel Culver, MD, [facility]   Primary Care Sports Medicine Primary Care and Sports Medicine at [facility]

## 2022-09-23 ENCOUNTER — Ambulatory Visit: Payer: Medicaid Other | Attending: Family Medicine

## 2022-09-23 DIAGNOSIS — M542 Cervicalgia: Secondary | ICD-10-CM | POA: Diagnosis not present

## 2022-09-23 DIAGNOSIS — M5412 Radiculopathy, cervical region: Secondary | ICD-10-CM | POA: Diagnosis not present

## 2022-09-23 NOTE — Therapy (Signed)
OUTPATIENT PHYSICAL THERAPY NECK EVALUATION  Patient Name: Maureen Hoffman MRN: [id number] DOB:1985-08-25, 37 y.o., female Today's Date: 09/23/2022   PT End of Session - 09/23/22 0843     Visit Number 1    Number of Visits 17    Date for PT Re-Evaluation 11/18/22    Authorization Type eval: 09/23/22    PT Start Time 0845    PT Stop Time 0930    PT Time Calculation (min) 45 min    Activity Tolerance Patient tolerated treatment well    Behavior During Therapy [facility] for tasks assessed/performed            Past Medical History:  Diagnosis Date   Hashimoto's disease    Mood swings    Seizures ([place])    as a child   Thrombocytopenia affecting pregnancy, antepartum ([place]) 12/03/2016   Thrombocytopenia complicating pregnancy ([place])    2018   Thyroid disease    Past Surgical History:  Procedure Laterality Date   NO PAST SURGERIES     Patient Active Problem List   Diagnosis Date Noted   Left cervical radiculopathy 09/08/2022   Thyroid disease    Neck pain 05/08/2021   Obesity (BMI 30-39.9) 05/08/2021   Elevated TSH 09/02/2020   Hypertension 09/02/2020   Anxiety and depression 11/13/2019   Hair loss 11/13/2019    PCP: Montel Culver, MD  REFERRING PROVIDER: Montel Culver, MD  REFERRING DIAGNOSIS: M54.12 (ICD-10-CM) - Left cervical radiculopathy   THERAPY DIAG: Cervicalgia  RATIONALE FOR EVALUATION AND TREATMENT: Rehabilitation  ONSET DATE: 09/2020  FOLLOW UP APPT WITH PROVIDER: Yes    SUBJECTIVE:                                                                                                                                                                                         Chief Complaint: Neck pain  Pertinent History Pt referred to PT for chronic neck pain which started 2 years ago (March 2022). She woke up one morning and her neck felt stiff. No history of neck trauma and symptoms are unchanged since onset. The pain is constant and generally worsens  throughout the day. Sometimes it is a mild nuisance and other times it is very limiting to her daily activities including a progressive decrease in her cervical rotation. Pain is located in the bilateral cervical spine and it radiates down to the shoulder blades as well as into the top of the shoulders. She also gets intermittent pain (shocking sensation) as well as N/T which radiates down the LUE into digits 4 and 5. LUE symptoms occur 2-3 times/wk for 30 minutes at a time.  Pt reports regular stiffness and popping in her neck and notices improvement in her symptoms when her neck pops. The pain causes her regular headaches. She has a past history migraines but not recently. History of HTN and hypothyroidism. She reports occasional BUE tremor in her hands. Pt complains of difficulty sleeping which has improved slightly since starting the medication prescribed by Dr. Zigmund Daniel. She has restless legs which wake her up at night and believes she might have sleep apnea because she snores at night and occasionally wakes up gasping for air. She gets occasional dizziness/rocking when bending forward or turning quickly and reports a couple episodes per year of vertigo when rolling in bed. No family history of autoimmune diseases. Grandmother with severe osteoporosis. Her father underwent a cervical fusion.   Cervical radiographs: 09/08/22: FINDINGS: Seven cervical segments are well visualized. Loss of the normal cervical lordosis is noted but stable from the prior CT examination. Vertebral body height is well maintained. No significant neural foraminal narrowing is seen on the right. Very mild neural foraminal narrowing is noted at C4-5 and C5-6 on the left. The odontoid is within normal limits. No acute fracture is noted.   Pain:  Pain Intensity: Present: 1/10, Best: 1/10, Worst: 8/10 Pain location: Bilateral cervical paraspinals radiating down to the top of the shoulders and down the LUE Pain Quality:  stiffness with  intermittent sharp pain  "Aching all day long" Radiating: Yes, see above history Numbness/Tingling: Yes, see above history Focal Weakness: No Aggravating factors: cervical rotation, forward flexion, extended sitting Relieving factors: moving her neck, massage, heating pad 24-hour pain behavior: worsens as the day progresses History of prior neck injury, pain, surgery, or therapy: No Falls: Has patient fallen in last 6 months? 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  Recent memory is intact.  Remote memory is intact.  Attention span and concentration are intact.  Expressive speech is intact.  Patient's fund of knowledge is within normal limits for educational level.    Gross Musculoskeletal Assessment Tremor: Mild BUE hand tremor noted; Bulk: Normal Tone: Normal  Gait Deferred  Posture Forward head and rounded shoulders;  AROM AROM (Normal range in degrees) AROM 09/23/2022  Cervical  Flexion (50) 50  Extension (80) 45*  Right lateral flexion (45) 45  Left lateral flexion (45) 40*  Right rotation (85) 60*  Left rotation (85) 70*  (* = pain; Blank rows = not tested)   MMT MMT (out of 5) Right 09/23/2022 Left 09/23/2022  Cervical (isometric)  Flexion WNL  Extension WNL   Lateral Flexion WNL* WNL  Rotation WNL WNL      Shoulder   Flexion 5* 5*  Extension    Abduction 5 5  Internal rotation 5 5  Horizontal abduction    Horizontal adduction    Lower Trapezius  Rhomboids        Elbow  Flexion 5 5  Extension 5 5  Pronation    Supination        Wrist  Flexion 5 5  Extension 5 5  Radial deviation    Ulnar deviation        MCP  Flexion 5 5  Extension 5 5  Abduction 5 5  Adduction 5 5  (* = pain; Blank rows = not tested)  Sensation Decreased sensation in LUE dermatomes C5 and C8. Otherwise sensation throughout BUE is intact and symmetrical to light touch sensation   Reflexes Deferred  Palpation Location LEFT  RIGHT           Suboccipitals 1 1  Cervical paraspinals 1 1  Upper Trapezius 1 1  Levator Scapulae 0 0  Rhomboid Major/Minor 0 0  (Blank rows = not tested) Graded on 0-4 scale (0 = no pain, 1 = pain, 2 = pain with wincing/grimacing/flinching, 3 = pain with withdrawal, 4 = unwilling to allow palpation), (Blank rows = not tested)  Repeated Movements No centralization or peripheralization of symptoms with repeated cervical retraction;  Passive Accessory Intervertebral Motion Deferred   SPECIAL TESTS Spurlings A (ipsilateral lateral flexion/axial compression): R: Negative L: Negative Spurlings B (ipsilateral lateral flexion/contralateral rotation/axial compression): R: Negative L: Negative Distraction Test: Positive for pain relief  Hoffman Sign (cervical cord compression): R: Negative L: Negative ULTT Median: R: Not examined L: Positive ULTT Ulnar: R: Not examined L: Positive ULTT Radial: R: Not examined L: Negative   TODAY'S TREATMENT  Deferred   PATIENT EDUCATION:  Education details: Plan of care Person educated: Patient Education method: Explanation Education comprehension: verbalized understanding   HOME EXERCISE PROGRAM: None currently   ASSESSMENT:  CLINICAL IMPRESSION: Patient is a 37 y.o. female  who was seen today for physical therapy evaluation and treatment for neck pain. She is very tender to palpation along bilateral cervical spine, suboccipitals, and upper traps. Positive median and ulnar ULTT. Relief with cervical distraction. Painful AROM with mild limitations most notable in rotation and extension. Decreased sensation in LUE dermatomes C5 and C8.   OBJECTIVE IMPAIRMENTS: decreased ROM and pain.   ACTIVITY LIMITATIONS: carrying, bending, and sitting  PARTICIPATION LIMITATIONS: meal prep, cleaning, laundry, community activity, and occupation  PERSONAL FACTORS: Past/current experiences, Time since onset of injury/illness/exacerbation, and 1-2 comorbidities: hypothyroidism and anxiety, are also affecting patient's functional outcome.   REHAB POTENTIAL: Good  CLINICAL DECISION MAKING: Evolving/moderate complexity  EVALUATION COMPLEXITY: Moderate   GOALS: Goals reviewed with patient? Yes  SHORT TERM GOALS: Target date: 10/21/2022  Pt will be independent with HEP to improve strength and decrease neck pain to improve pain-free function at home and work. Baseline:  Goal status: INITIAL  2.  Pt will decrease worst neck pain by at least 2 points on the NPRS in order to demonstrate clinically significant reduction in neck pain. Baseline: 09/23/22: worst: 8/10; Goal status: INITIAL   LONG TERM GOALS: Target date: 11/18/2022  Pt will increase FOTO to at least 66 to demonstrate significant improvement in function at home and work related to neck pain  Baseline: 09/23/22: 63 Goal status: INITIAL  2.  Pt will decrease NDI score by at least 19% in order demonstrate clinically significant reduction in neck pain/disability.       Baseline: 09/23/22: To be completed at next visit Goal status: INITIAL  3.  Pt will report at least 75% improvement in her neck symptoms in order to complete all  household responsibilities as well as try to play pickleball without significant increase in  pain.  Baseline:  Goal status: INITIAL   PLAN: PT FREQUENCY: 2x/week  PT DURATION: 8 weeks  PLANNED INTERVENTIONS: Therapeutic exercises, Therapeutic activity, Neuromuscular re-education, Balance training, Gait training, Patient/Family education, Joint manipulation, Joint mobilization, Vestibular training, Canalith repositioning, Dry Needling, Electrical stimulation, Spinal manipulation, Spinal mobilization, Cryotherapy, Moist heat, Taping, Traction, Ultrasound, Ionotophoresis '4mg'$ /ml Dexamethasone, and Manual therapy  PLAN FOR NEXT SESSION: Assess PAIVM, test reflexes, consider manual or mechanical traction, nerve glides, soft tissue mobilization, consider TDN, issue HEP;  Lyndel Safe Bridgitt Raggio PT, DPT, GCS  Michaeleen Down 09/23/2022, 10:54 AM

## 2022-09-25 ENCOUNTER — Encounter: Payer: Self-pay | Admitting: Family Medicine

## 2022-09-27 NOTE — Telephone Encounter (Signed)
Please review.  KP

## 2022-10-01 ENCOUNTER — Ambulatory Visit: Payer: Medicaid Other

## 2022-10-01 DIAGNOSIS — M542 Cervicalgia: Secondary | ICD-10-CM

## 2022-10-03 ENCOUNTER — Other Ambulatory Visit: Payer: Self-pay | Admitting: Family Medicine

## 2022-10-03 DIAGNOSIS — I10 Essential (primary) hypertension: Secondary | ICD-10-CM

## 2022-10-04 ENCOUNTER — Ambulatory Visit: Payer: Medicaid Other | Attending: Family Medicine

## 2022-10-04 DIAGNOSIS — M542 Cervicalgia: Secondary | ICD-10-CM | POA: Insufficient documentation

## 2022-10-04 NOTE — Telephone Encounter (Signed)
Requested medication (s) are due for refill today: requesting 90 days supply  Requested medication (s) are on the active medication list: yes   Last refill:  trazadone-09/08/22 #30 1 refills, cymbalta- 09/08/22-11/07/22 #106 0 refills,hydrodiuril-09/08/22 #30 1 refill  Future visit scheduled: yes in 1 month   Notes to clinic:   Pharmacy comment: REQUEST FOR Maureen Hoffman.  Do you want to refill for 90 day supply?     Requested Prescriptions  Pending Prescriptions Disp Refills   traZODone (DESYREL) 50 MG tablet [Pharmacy Med Name: TRAZODONE 50 MG TABLET] 90 tablet 1    Sig: TAKE 0.5-1 TABLETS BY MOUTH AT BEDTIME AS NEEDED FOR SLEEP.     Psychiatry: Antidepressants - Serotonin Modulator Passed - 10/03/2022  9:33 AM      Passed - Completed PHQ-2 or PHQ-9 in the last 360 days      Passed - Valid encounter within last 6 months    Recent Outpatient Visits           3 weeks ago Anxiety and depression   [facility] [place], Earley Abide, MD   5 months ago Primary hypertension   [place], NP   11 months ago Anxiety and depression   [place] Jon Billings, NP   1 year ago Anxiety and depression   [place] Jon Billings, NP   1 year ago Neck pain   [place], NP       Future Appointments             In 1 month Zigmund Daniel, Earley Abide, MD [place] at [place], [facility]             DULoxetine (CYMBALTA) 30 MG capsule [Pharmacy Med Name: DULOXETINE HCL DR 30 MG CAP] 180 capsule 1    Sig: TAKE 1 CAPSULE BY MOUTH EVERY EVENING FOR 14 DAYS, THEN 2 CAPSULES (60 MG TOTAL) EVERY EVENING.     Psychiatry: Antidepressants - SNRI - duloxetine Failed - 10/03/2022  9:33 AM      Failed - Last BP in normal range    BP Readings from Last 1 Encounters:  09/08/22  (!) 148/110         Passed - Cr in normal range and within 360 days    Creatinine  Date Value Ref Range Status  11/02/2013 0.59 (L) 0.60 - 1.30 mg/dL Final   Creatinine, Ser  Date Value Ref Range Status  04/27/2022 0.68 0.57 - 1.00 mg/dL Final         Passed - eGFR is 30 or above and within 360 days    EGFR (African American)  Date Value Ref Range Status  11/02/2013 >60  Final   GFR calc Af Amer  Date Value Ref Range Status  09/02/2020 136 >59 mL/min/1.73 Final    Comment:    **In accordance with recommendations from the NKF-ASN Task force,**   [facility] is in the process of updating its eGFR calculation to the   2021 CKD-EPI creatinine equation that estimates kidney function   without a race variable.    EGFR (Non-African Amer.)  Date Value Ref Range Status  11/02/2013 >60  Final    Comment:    eGFR values <43m/min/1.73 m2 may be an indication of chronic kidney disease (CKD). Calculated eGFR is useful in patients with stable renal  function. The eGFR calculation will not be reliable in acutely ill patients when serum creatinine is changing rapidly. It is not useful in  patients on dialysis. The eGFR calculation may not be applicable to patients at the low and high extremes of body sizes, pregnant women, and vegetarians.    GFR, Estimated  Date Value Ref Range Status  02/02/2022 >60 >60 mL/min Final    Comment:    (NOTE) Calculated using the CKD-EPI Creatinine Equation (2021)    eGFR  Date Value Ref Range Status  04/27/2022 116 >59 mL/min/1.73 Final         Passed - Completed PHQ-2 or PHQ-9 in the last 360 days      Passed - Valid encounter within last 6 months    Recent Outpatient Visits           3 weeks ago Anxiety and depression   [place] [place], Earley Abide, MD   5 months ago Primary hypertension   [place], NP   11 months ago Anxiety and depression    [place] Jon Billings, NP   1 year ago Anxiety and depression   [place] Jon Billings, NP   1 year ago Neck pain   [place], NP       Future Appointments             In 1 month Zigmund Daniel Earley Abide, MD [place] at [place], [facility]             hydrochlorothiazide (HYDRODIURIL) 25 MG tablet [Pharmacy Med Name: HYDROCHLOROTHIAZIDE 25 MG TAB] 90 tablet 1    Sig: Take 1 tablet (25 mg total) by mouth daily.     Cardiovascular: Diuretics - Thiazide Failed - 10/03/2022  9:33 AM      Failed - Last BP in normal range    BP Readings from Last 1 Encounters:  09/08/22 (!) 148/110         Passed - Cr in normal range and within 180 days    Creatinine  Date Value Ref Range Status  11/02/2013 0.59 (L) 0.60 - 1.30 mg/dL Final   Creatinine, Ser  Date Value Ref Range Status  04/27/2022 0.68 0.57 - 1.00 mg/dL Final         Passed - K in normal range and within 180 days    Potassium  Date Value Ref Range Status  04/27/2022 3.6 3.5 - 5.2 mmol/L Final  11/02/2013 4.2 3.5 - 5.1 mmol/L Final         Passed - Na in normal range and within 180 days    Sodium  Date Value Ref Range Status  04/27/2022 139 134 - 144 mmol/L Final  11/02/2013 137 136 - 145 mmol/L Final         Passed - Valid encounter within last 6 months    Recent Outpatient Visits           3 weeks ago Anxiety and depression   [place] [facility] [place], Earley Abide, MD   5 months ago Primary hypertension   [place], NP   11 months ago Anxiety and depression   [place], NP   1 year ago Anxiety and depression   [place]  [place], NP   1 year ago Neck pain   [place],  Scheryl Darter, NP       Future Appointments             In 1 month Zigmund Daniel, Earley Abide, MD [place] at [facility], [facility]

## 2022-10-11 ENCOUNTER — Ambulatory Visit: Payer: Medicaid Other

## 2022-10-12 NOTE — Telephone Encounter (Signed)
Appointment made for 3/25 at 11am

## 2022-10-18 ENCOUNTER — Encounter: Payer: Self-pay | Admitting: Family Medicine

## 2022-10-18 ENCOUNTER — Ambulatory Visit (INDEPENDENT_AMBULATORY_CARE_PROVIDER_SITE_OTHER): Payer: Medicaid Other | Admitting: Family Medicine

## 2022-10-18 VITALS — BP 150/110 | HR 92 | Ht 63.0 in | Wt 152.0 lb

## 2022-10-18 DIAGNOSIS — Z114 Encounter for screening for human immunodeficiency virus [HIV]: Secondary | ICD-10-CM | POA: Diagnosis not present

## 2022-10-18 DIAGNOSIS — R7989 Other specified abnormal findings of blood chemistry: Secondary | ICD-10-CM | POA: Diagnosis not present

## 2022-10-18 DIAGNOSIS — I10 Essential (primary) hypertension: Secondary | ICD-10-CM

## 2022-10-18 DIAGNOSIS — Z1159 Encounter for screening for other viral diseases: Secondary | ICD-10-CM | POA: Diagnosis not present

## 2022-10-18 DIAGNOSIS — M5412 Radiculopathy, cervical region: Secondary | ICD-10-CM

## 2022-10-18 DIAGNOSIS — E079 Disorder of thyroid, unspecified: Secondary | ICD-10-CM | POA: Diagnosis not present

## 2022-10-18 DIAGNOSIS — Z13220 Encounter for screening for lipoid disorders: Secondary | ICD-10-CM

## 2022-10-18 DIAGNOSIS — E559 Vitamin D deficiency, unspecified: Secondary | ICD-10-CM

## 2022-10-18 DIAGNOSIS — R0681 Apnea, not elsewhere classified: Secondary | ICD-10-CM | POA: Insufficient documentation

## 2022-10-18 MED ORDER — HYDROCHLOROTHIAZIDE 25 MG PO TABS: 25.0000 mg | ORAL_TABLET | Freq: Every day | ORAL | 1 refills | Status: DC

## 2022-10-18 MED ORDER — NORGESTIMATE-ETH ESTRADIOL 0.25-35 MG-MCG PO TABS: 1.0000 | ORAL_TABLET | Freq: Every day | ORAL | 3 refills | Status: DC

## 2022-10-18 MED ORDER — LISINOPRIL 10 MG PO TABS: 10.0000 mg | ORAL_TABLET | Freq: Every day | ORAL | 3 refills | Status: DC

## 2022-10-18 NOTE — Assessment & Plan Note (Signed)
Chronic, denies any active cardiopulmonary complaints.  Has been compliant with hydrochlorothiazide 25 mg, pressures noted.  In addition to addition of lisinopril 10 mg, referral to ENT/sleep medicine placed to evaluate for OSA given witnessed apneic episodes, referral to cardiology for further optimization.  Close follow-up scheduled.

## 2022-10-18 NOTE — Progress Notes (Signed)
Primary Care / Sports Medicine Office Visit  Patient Information:  Patient ID: CLEATIS HOFFERT, female DOB: Jan 22, 1986 Age: 37 y.o. MRN: [id number]   MIRIAN CAISSIE is a pleasant 37 y.o. female presenting with the following:  Chief Complaint  Patient presents with   hand tremors    4 weeks stopped taking Cymbalta and is feeling much better    Vitals:   10/18/22 1109 10/18/22 1113  BP: (!) 148/110 (!) 150/110  Pulse: 92   SpO2: 98%    Vitals:   10/18/22 1109  Weight: 152 lb (68.9 kg)  Height: 5\' 3"  (1.6 m)   Body mass index is 26.93 kg/m.  No results found.   Independent interpretation of notes and tests performed by another provider:   None  Procedures performed:   None  Pertinent History, Exam, Impression, and Recommendations:   Gwynn was seen today for hand tremors.  Primary hypertension Assessment & Plan: Chronic, denies any active cardiopulmonary complaints.  Has been compliant with hydrochlorothiazide 25 mg, pressures noted.  In addition to addition of lisinopril 10 mg, referral to ENT/sleep medicine placed to evaluate for OSA given witnessed apneic episodes, referral to cardiology for further optimization.  Close follow-up scheduled.  Orders: -     hydroCHLOROthiazide; Take 1 tablet (25 mg total) by mouth daily.  Dispense: 30 tablet; Refill: 1 -     Comprehensive metabolic panel -     CBC -     Lipid panel -     TSH -     T3, free -     T4, free -     Lisinopril; Take 1 tablet (10 mg total) by mouth daily.  Dispense: 30 tablet; Refill: 3 -     Ambulatory referral to Cardiology  Thyroid disease Assessment & Plan: Updated labs ordered, has visit with endocrinology at the end of next month.  Orders: -     TSH -     T3, free -     T4, free -     Thyroid peroxidase antibody  Elevated TSH -     TSH -     T3, free -     T4, free -     Thyroid peroxidase antibody  Vitamin D deficiency -     VITAMIN D 25 Hydroxy (Vit-D Deficiency,  Fractures)  Screening for lipoid disorders -     Comprehensive metabolic panel -     Lipid panel  Screening for HIV (human immunodeficiency virus) -     HIV Antibody (routine testing w rflx)  Need for hepatitis C screening test -     Hepatitis C antibody  Left cervical radiculopathy Assessment & Plan: Chronic condition, stable symptomatology.  Recent cervical spine x-rays do demonstrate reversal of cervical lordosis, left sided foraminal narrowing.  She has attended 1 physical therapy session but plans to attend more, could not tolerate duloxetine due to hand tremor but has been dosing gabapentin with positive response.   Witnessed apneic spells Assessment & Plan: See additional assessment(s) for plan details.  Orders: -     Ambulatory referral to ENT  Other orders -     Norgestimate-Eth Estradiol; Take 1 tablet by mouth daily.  Dispense: 84 tablet; Refill: 3   I provided a total time of 48 minutes including both face-to-face and non-face-to-face time on 10/18/2022 inclusive of time utilized for medical chart review, information gathering, care coordination with staff, and documentation completion.   Orders & Medications Meds ordered  this encounter  Medications   hydrochlorothiazide (HYDRODIURIL) 25 MG tablet    Sig: Take 1 tablet (25 mg total) by mouth daily.    Dispense:  30 tablet    Refill:  1   norgestimate-ethinyl estradiol (ORTHO-CYCLEN) 0.25-35 MG-MCG tablet    Sig: Take 1 tablet by mouth daily.    Dispense:  84 tablet    Refill:  3   lisinopril (ZESTRIL) 10 MG tablet    Sig: Take 1 tablet (10 mg total) by mouth daily.    Dispense:  30 tablet    Refill:  3   Orders Placed This Encounter  Procedures   Comprehensive metabolic panel   Hepatitis C antibody   CBC   Lipid panel   HIV Antibody (routine testing w rflx)   TSH   VITAMIN D 25 Hydroxy (Vit-D Deficiency, Fractures)   T3, free   T4, free   Thyroid peroxidase antibody   Ambulatory referral to  Cardiology   Ambulatory referral to ENT     No follow-ups on file.     Montel Culver, MD, [facility]   Primary Care Sports Medicine [facility] Care and [facility] Medicine at [facility]

## 2022-10-18 NOTE — Assessment & Plan Note (Signed)
See additional assessment(s) for plan details. 

## 2022-10-18 NOTE — Assessment & Plan Note (Signed)
Chronic condition, stable symptomatology.  Recent cervical spine x-rays do demonstrate reversal of cervical lordosis, left sided foraminal narrowing.  She has attended 1 physical therapy session but plans to attend more, could not tolerate duloxetine due to hand tremor but has been dosing gabapentin with positive response.

## 2022-10-18 NOTE — Assessment & Plan Note (Signed)
Updated labs ordered, has visit with endocrinology at the end of next month.

## 2022-10-18 NOTE — Patient Instructions (Addendum)
-   Obtain fasting labs with orders provided (can have water or black coffee but otherwise no food or drink x 8 hours before labs) - Review information provided - Remain off of duloxetine (Cymbalta) - Continue hydrochlorothiazide - Start lisinopril daily - Referral coordinator will contact you regarding visits with Cardiology and ENT/Sleep Medicine - Return early May

## 2022-10-21 ENCOUNTER — Ambulatory Visit: Payer: Medicaid Other | Admitting: Family Medicine

## 2022-10-29 DIAGNOSIS — R7989 Other specified abnormal findings of blood chemistry: Secondary | ICD-10-CM | POA: Diagnosis not present

## 2022-10-29 DIAGNOSIS — Z1159 Encounter for screening for other viral diseases: Secondary | ICD-10-CM | POA: Diagnosis not present

## 2022-10-29 DIAGNOSIS — I10 Essential (primary) hypertension: Secondary | ICD-10-CM | POA: Diagnosis not present

## 2022-10-29 DIAGNOSIS — E079 Disorder of thyroid, unspecified: Secondary | ICD-10-CM | POA: Diagnosis not present

## 2022-10-29 DIAGNOSIS — Z13220 Encounter for screening for lipoid disorders: Secondary | ICD-10-CM | POA: Diagnosis not present

## 2022-10-29 DIAGNOSIS — E559 Vitamin D deficiency, unspecified: Secondary | ICD-10-CM | POA: Diagnosis not present

## 2022-10-29 DIAGNOSIS — Z114 Encounter for screening for human immunodeficiency virus [HIV]: Secondary | ICD-10-CM | POA: Diagnosis not present

## 2022-10-30 LAB — CBC
Hematocrit: 37 % (ref 34.0–46.6)
Hemoglobin: 11.5 g/dL (ref 11.1–15.9)
MCH: 24.9 pg — ABNORMAL LOW (ref 26.6–33.0)
MCHC: 31.1 g/dL — ABNORMAL LOW (ref 31.5–35.7)
MCV: 80 fL (ref 79–97)
Platelets: 266 10*3/uL (ref 150–450)
RBC: 4.62 x10E6/uL (ref 3.77–5.28)
RDW: 15 % (ref 11.7–15.4)
WBC: 6.5 10*3/uL (ref 3.4–10.8)

## 2022-10-30 LAB — COMPREHENSIVE METABOLIC PANEL
ALT: 12 IU/L (ref 0–32)
AST: 15 IU/L (ref 0–40)
Albumin/Globulin Ratio: 1.4 (ref 1.2–2.2)
Albumin: 4.4 g/dL (ref 3.9–4.9)
Alkaline Phosphatase: 66 IU/L (ref 44–121)
BUN/Creatinine Ratio: 14 (ref 9–23)
BUN: 12 mg/dL (ref 6–20)
Bilirubin Total: 0.4 mg/dL (ref 0.0–1.2)
CO2: 21 mmol/L (ref 20–29)
Calcium: 9.3 mg/dL (ref 8.7–10.2)
Chloride: 100 mmol/L (ref 96–106)
Creatinine, Ser: 0.86 mg/dL (ref 0.57–1.00)
Globulin, Total: 3.1 g/dL (ref 1.5–4.5)
Glucose: 91 mg/dL (ref 70–99)
Potassium: 4 mmol/L (ref 3.5–5.2)
Sodium: 135 mmol/L (ref 134–144)
Total Protein: 7.5 g/dL (ref 6.0–8.5)
eGFR: 89 mL/min/{1.73_m2} (ref >59)

## 2022-10-30 LAB — LIPID PANEL
Chol/HDL Ratio: 3.6 ratio (ref 0.0–4.4)
Cholesterol, Total: 236 mg/dL — ABNORMAL HIGH (ref 100–199)
HDL: 65 mg/dL (ref >39)
LDL Chol Calc (NIH): 144 mg/dL — ABNORMAL HIGH (ref 0–99)
Triglycerides: 151 mg/dL — ABNORMAL HIGH (ref 0–149)
VLDL Cholesterol Cal: 27 mg/dL (ref 5–40)

## 2022-10-30 LAB — HEPATITIS C ANTIBODY: Hep C Virus Ab: NONREACTIVE

## 2022-10-30 LAB — HIV ANTIBODY (ROUTINE TESTING W REFLEX): HIV Screen 4th Generation wRfx: NONREACTIVE

## 2022-10-30 LAB — TSH: TSH: 3.52 u[IU]/mL (ref 0.450–4.500)

## 2022-10-30 LAB — THYROID PEROXIDASE ANTIBODY: Thyroperoxidase Ab SerPl-aCnc: 475 IU/mL — ABNORMAL HIGH (ref 0–34)

## 2022-10-30 LAB — T4, FREE: Free T4: 1.2 ng/dL (ref 0.82–1.77)

## 2022-10-30 LAB — T3, FREE: T3, Free: 3 pg/mL (ref 2.0–4.4)

## 2022-10-30 LAB — VITAMIN D 25 HYDROXY (VIT D DEFICIENCY, FRACTURES): Vit D, 25-Hydroxy: 36.8 ng/mL (ref 30.0–100.0)

## 2022-11-04 ENCOUNTER — Other Ambulatory Visit: Payer: Self-pay

## 2022-11-04 ENCOUNTER — Other Ambulatory Visit: Payer: Self-pay | Admitting: Nurse Practitioner

## 2022-11-04 DIAGNOSIS — R7301 Impaired fasting glucose: Secondary | ICD-10-CM

## 2022-11-04 DIAGNOSIS — D649 Anemia, unspecified: Secondary | ICD-10-CM

## 2022-11-04 NOTE — Progress Notes (Signed)
noted 

## 2022-11-04 NOTE — Telephone Encounter (Signed)
Requested medication (s) are due for refill today: yes  Requested medication (s) are on the active medication list: yes  Last refill:  09/10/21 #16g/6  Future visit scheduled: yes  Notes to clinic:  prescribed by Clydie Braun, NP from [facility]. Please review for refill     Requested Prescriptions  Pending Prescriptions Disp Refills   fluticasone (FLONASE) 50 MCG/ACT nasal spray [Pharmacy Med Name: FLUTICASONE PROP 50 MCG SPRAY] 16 mL 6    Sig: SPRAY 2 SPRAYS INTO EACH NOSTRIL EVERY DAY     Ear, Nose, and Throat: Nasal Preparations - Corticosteroids Passed - 11/04/2022 11:09 AM      Passed - Valid encounter within last 12 months    Recent Outpatient Visits           2 weeks ago Primary hypertension   [place] [facility] Ocie Bob, MD   1 month ago Anxiety and depression   [place] [facility] Ocie Bob, MD   6 months ago Primary hypertension   [place] [facility], [facility]   1 year ago Anxiety and depression   [facility]   1 year ago Anxiety and depression    [facility]       Future Appointments             In 1 week Herbie [place] Piedad Climes, MD [facility] at [facility]   In 3 weeks Ashley Royalty, Ocie Bob, MD [facility] Health Primary Care & Sports Medicine at [facility]

## 2022-11-05 ENCOUNTER — Other Ambulatory Visit: Payer: Self-pay | Admitting: Family Medicine

## 2022-11-05 NOTE — Telephone Encounter (Signed)
Requested Prescriptions  Pending Prescriptions Disp Refills   traZODone (DESYREL) 50 MG tablet [Pharmacy Med Name: TRAZODONE 50 MG TABLET] 30 tablet 1    Sig: TAKE 0.5-1 TABLETS BY MOUTH AT BEDTIME AS NEEDED FOR SLEEP.     Psychiatry: Antidepressants - Serotonin Modulator Passed - 11/05/2022 12:32 PM      Passed - Completed PHQ-2 or PHQ-9 in the last 360 days      Passed - Valid encounter within last 6 months    Recent Outpatient Visits           2 weeks ago Primary hypertension   [place] [facility] Ocie Bob, MD   1 month ago Anxiety and depression   [place] [facility], Ocie Bob, MD   6 months ago Primary hypertension   [place] [facility] Larae Grooms, NP   1 year ago Anxiety and depression   [place] [facility]ae [facility]rooms, NP   1 year ago Anxiety and depression   [place] [facility] Larae Grooms, NP       Future Appointments             In 1 week Marykay Lex, MD [facility] at [facility]   In 2 weeks Ashley Royalty, Ocie Bob, MD [facility] Health Primary Care & Sports Medicine at [facility]

## 2022-11-08 ENCOUNTER — Ambulatory Visit: Payer: Medicaid Other | Admitting: Family Medicine

## 2022-11-15 ENCOUNTER — Ambulatory Visit: Payer: Medicaid Other | Attending: Cardiology | Admitting: Cardiology

## 2022-11-15 ENCOUNTER — Encounter: Payer: Self-pay | Admitting: Cardiology

## 2022-11-15 VITALS — BP 126/80 | HR 74 | Ht 63.0 in | Wt 171.2 lb

## 2022-11-15 DIAGNOSIS — E669 Obesity, unspecified: Secondary | ICD-10-CM

## 2022-11-15 DIAGNOSIS — E079 Disorder of thyroid, unspecified: Secondary | ICD-10-CM

## 2022-11-15 DIAGNOSIS — I10 Essential (primary) hypertension: Secondary | ICD-10-CM

## 2022-11-15 NOTE — Progress Notes (Unsigned)
Primary Care Provider: Jerrol Banana, MD [facility] Cardiologist: Bryan Lemma, MD Electrophysiologist: None  Clinic Note: No chief complaint on file.  ===================================  ASSESSMENT/PLAN   Problem List Items Addressed This Visit   None  ===================================  HPI:    Maureen Hoffman is a 37 y.o. female who is being seen today for the evaluation of *** at the request of Maureen Royalty Ocie Bob, MD.  Maureen Hoffman was last seen on ***  Recent Hospitalizations: ***  Reviewed  CV studies:    The following studies were reviewed today: (if available, images/films reviewed: From Epic Chart or Care Everywhere) ***:   Interval History:   Maureen Hoffman   CV Review of Symptoms (Summary): {roscv:310661}  REVIEWED OF SYSTEMS   ROS  I have reviewed and (if needed) personally updated the patient's problem list, medications, allergies, past medical and surgical history, social and family history.   PAST MEDICAL HISTORY   Past Medical History:  Diagnosis Date   Hashimoto's disease    Mood swings    Seizures    as a child   Thrombocytopenia affecting pregnancy, antepartum 12/03/2016   Thrombocytopenia complicating pregnancy    2018   Thyroid disease     PAST SURGICAL HISTORY   Past Surgical History:  Procedure Laterality Date   NO PAST SURGERIES      MEDICATIONS/ALLERGIES     Current Meds  Medication Sig   buPROPion (WELLBUTRIN XL) 300 MG 24 hr tablet Take 1 tablet (300 mg total) by mouth daily.   fluticasone (FLONASE) 50 MCG/ACT nasal spray SPRAY 2 SPRAYS INTO EACH NOSTRIL EVERY DAY   gabapentin (NEURONTIN) 300 MG capsule Take 1 capsule (300 mg total) by mouth at bedtime. Start taking 1 capsule at bedtime, if needed after 3 days can increase to twice a day   hydrochlorothiazide (HYDRODIURIL) 25 MG tablet Take 1 tablet (25 mg total) by mouth daily.   levothyroxine (SYNTHROID) 50 MCG tablet Take 1 tablet (50 mcg  total) by mouth daily.   Multiple Vitamin (MULTIVITAMIN) tablet Take 1 tablet by mouth daily.   norgestimate-ethinyl estradiol (ORTHO-CYCLEN) 0.25-35 MG-MCG tablet Take 1 tablet by mouth daily.   traZODone (DESYREL) 50 MG tablet TAKE 0.5-1 TABLETS BY MOUTH AT BEDTIME AS NEEDED FOR SLEEP.    No Known Allergies  SOCIAL HISTORY/FAMILY HISTORY   Reviewed in Epic:   Social History   Tobacco Use   Smoking status: Former    Types: Cigarettes    Quit date: 07/13/2010    Years since quitting: 12.3   Smokeless tobacco: Never  Vaping Use   Vaping Use: Never used  Substance Use Topics   Alcohol use: Yes    Comment: rare   Drug use: No   Social History   Social History Narrative   Not on file   Family History  Problem Relation Age of Onset   Healthy Mother    Colon cancer Father    Hashimoto's thyroiditis Sister    Breast cancer Maternal Grandmother    Diabetes Maternal Grandmother    Breast cancer Paternal Grandmother    COPD Paternal Grandmother    Brain cancer Paternal Grandfather     OBJCTIVE -PE, EKG, labs   Wt Readings from Last 3 Encounters:  11/15/22 171 lb 3.2 oz (77.7 kg)  10/18/22 152 lb (68.9 kg)  09/08/22 173 lb 6.4 oz (78.7 kg)    Physical Exam: BP 126/80 (BP Location: Left Arm, Patient Position: Sitting, Cuff Size: Normal)  Pulse 74   Ht  (1.6 m)   Wt 171 lb 3.2 oz (77.7 kg)   SpO2 100%   BMI 30.33 kg/m  Physical Exam   Adult ECG Report  Rate: *** ;  Rhythm: {rhythm:17366};   Narrative Interpretation: ***  Recent Labs:  ***  Lab Results  Component Value Date   CHOL 236 (H) 10/29/2022   HDL 65 10/29/2022   LDLCALC 144 (H) 10/29/2022   TRIG 151 (H) 10/29/2022   CHOLHDL 3.6 10/29/2022   Lab Results  Component Value Date   CREATININE 0.86 10/29/2022   BUN 12 10/29/2022   NA 135 10/29/2022   K 4.0 10/29/2022   CL 100 10/29/2022   CO2 21 10/29/2022      Latest Ref Rng & Units 10/29/2022    1:06 PM 02/02/2022    4:20 PM 05/07/2021     4:09 PM  CBC  WBC 3.4 - 10.8 x10E3/uL 6.5  10.6  7.4   Hemoglobin 11.1 - 15.9 g/dL 16.1  09.6  04.5   Hematocrit 34.0 - 46.6 % 37.0  36.5  37.4   Platelets 150 - 450 x10E3/uL 266  159  218     No results found for: "HGBA1C" Lab Results  Component Value Date   TSH 3.520 10/29/2022    ================================================== I spent a total of *** minutes with the patient spent in direct patient consultation.  Additional time spent with chart review  / charting (studies, outside notes, etc): *** min Total Time: *** min  Current medicines are reviewed at length with the patient today.  (+/- concerns) ***  Notice: This dictation was prepared with Dragon dictation along with smart phrase technology. Any transcriptional errors that result from this process are unintentional and may not be corrected upon review.   Studies Ordered:  No orders of the defined types were placed in this encounter.  No orders of the defined types were placed in this encounter.   Patient Instructions / Medication Changes & Studies & Tests Ordered   There are no Patient Instructions on file for this visit.    Marykay Lex, MD, MS Bryan Lemma, M.D., M.S. Interventional Cardiologist  [facility]  Pager # [phone] Phone # [phone] [address] [place], [place] [address]   Thank you for choosing [facility] [place]!!

## 2022-11-15 NOTE — Patient Instructions (Signed)
Medication Instructions:   No changes  *If you need a refill on your cardiac medications before your next appointment, please call your pharmacy*  Other Instructions    Keep  blood pressure log -  keep a record of your pressures - 3 to 4 times a week  do not take your blood pressure the same time each day. Until your next appointment - you can send information through [employer]:  24 hour urine metanephrines    If you have labs (blood work) drawn today and your tests are completely normal, you will receive your results only by: MyChart Message (if you have MyChart) OR A paper copy in the mail If you have any lab test that is abnormal or we need to change your treatment, we will call you to review the results.   Testing/Procedures: Will be schedule at [address] Your physician has requested that you have a renal artery duplex. During this test, an ultrasound is used to evaluate blood flow to the kidneys. Allow one hour for this exam. Do not eat after midnight the day before and avoid carbonated beverages. Take your medications as you usually do.    Follow-Up: At [facility], you and your health needs are our priority.  As part of our continuing mission to provide you with exceptional heart care, we have created designated Provider Care Teams.  These Care Teams include your primary Cardiologist (physician) and Advanced Practice Providers (APPs -  Physician Assistants and Nurse Practitioners) who all work together to provide you with the care you need, when you need it.     Your next appointment:   2 to 3 month(s)  The format for your next appointment:   Virtual Visit   Provider:   Bryan Lemma, MD    Other Instructions    Keep  blood pressure log -  keep a record of your pressures - 3 to 4 times a week  do not take your blood pressure the same time each day. Until your next appointment - you can send information through [facility]

## 2022-11-16 ENCOUNTER — Encounter: Payer: Self-pay | Admitting: Cardiology

## 2022-11-16 NOTE — Assessment & Plan Note (Signed)
She is working on diet and exercise.  Has also cut out sweets and alcohol as the goalie empty calories.  Hopefully she will be able to drop weight.

## 2022-11-16 NOTE — Assessment & Plan Note (Addendum)
Now hypothyroid he has a history of burned-out Hashimoto's.  Seems to be stable.  Referred to endocrinology for management now.  Lab Results  Component Value Date   TSH 3.520 10/29/2022   Continue Synthroid.  Dosing per PCP or now endocrinology, after referral

## 2022-11-16 NOTE — Assessment & Plan Note (Signed)
She is relatively young for having hypertension and no real family history of hypertension.  Need to look for secondary causes.  Patient may, her blood pressure is well-controlled here today.  This is probably in no small part related to her cutting down the alcohol and working on dietary changes and exercise.  We may not need to do much more titration medications if she continues on the trend.  Unfortunately, with her being on HCTZ we cannot do renal and aldosterone levels, nor can be accurately assessed sodium potassium levels.. Will check renal artery Dopplers and urine metanephrines since there seems to be pronounced pressures. I have asked that she keep track of her blood pressure recordings maybe 3-4 times a week 3 times a day.  If she has blood pressure recordings over 135/85 on average, I will want her to on one half of the lisinopril tablets that she was prescribed.     Will reassess in a few months after studies are done.

## 2022-11-18 ENCOUNTER — Other Ambulatory Visit: Payer: Self-pay | Admitting: Family Medicine

## 2022-11-19 DIAGNOSIS — E063 Autoimmune thyroiditis: Secondary | ICD-10-CM | POA: Diagnosis not present

## 2022-11-19 NOTE — Telephone Encounter (Signed)
Unable to refill per protocol, Rx request is too soon.   Requested Prescriptions  Pending Prescriptions Disp Refills   traZODone (DESYREL) 50 MG tablet [Pharmacy Med Name: TRAZODONE 50 MG TABLET] 90 tablet 1    Sig: TAKE 1/2 TO 1 TABLET BY MOUTH AT BEDTIME AS NEEDED FOR SLEEP     Psychiatry: Antidepressants - Serotonin Modulator Passed - 11/18/2022  7:16 PM      Passed - Completed PHQ-2 or PHQ-9 in the last 360 days      Passed - Valid encounter within last 6 months    Recent Outpatient Visits           1 month ago Primary hypertension   [place] [facility], Ocie Bob, MD   2 months ago Anxiety and depression   [place] [facility], Ocie Bob, MD   6 months ago Primary hypertension   [place] [facility] Larae Grooms, NP   1 year ago Anxiety and depression   [place] [facility] Lar[facility], [facility]   1 year ago Anxiety and depression   [place] [facility] Larae Grooms, NP       Future Appointments             In 6 days Ashley Royalty, Ocie Bob, MD [facility] Health Primary Care & Sports Medicine at [facility], [facility]   In 2 months Marykay Lex, MD [place] [facility] Heart[facility] at [facility]

## 2022-11-22 ENCOUNTER — Other Ambulatory Visit: Payer: Self-pay | Admitting: Family Medicine

## 2022-11-22 ENCOUNTER — Encounter: Payer: Self-pay | Admitting: Family Medicine

## 2022-11-22 DIAGNOSIS — I10 Essential (primary) hypertension: Secondary | ICD-10-CM

## 2022-11-23 NOTE — Telephone Encounter (Signed)
Requested medication (s) are due for refill today: yes  Requested medication (s) are on the active medication list: yes  Last refill:  10/18/22 #30 1 refills  Future visit scheduled: yes in 2 days   Notes to clinic:  requesting 90 day supply. Do you want to refill for #30 or #90?     Requested Prescriptions  Pending Prescriptions Disp Refills   hydrochlorothiazide (HYDRODIURIL) 25 MG tablet [Pharmacy Med Name: HYDROCHLOROTHIAZIDE 25 MG TAB] 90 tablet 1    Sig: Take 1 tablet (25 mg total) by mouth daily.     Cardiovascular: Diuretics - Thiazide Passed - 11/22/2022  7:16 PM      Passed - Cr in normal range and within 180 days    Creatinine  Date Value Ref Range Status  11/02/2013 0.59 (L) 0.60 - 1.30 mg/dL Final   Creatinine, Ser  Date Value Ref Range Status  10/29/2022 0.86 0.57 - 1.00 mg/dL Final         Passed - K in normal range and within 180 days    Potassium  Date Value Ref Range Status  10/29/2022 4.0 3.5 - 5.2 mmol/L Final  11/02/2013 4.2 3.5 - 5.1 mmol/L Final         Passed - Na in normal range and within 180 days    Sodium  Date Value Ref Range Status  10/29/2022 135 134 - 144 mmol/L Final  11/02/2013 137 136 - 145 mmol/L Final         Passed - Last BP in normal range    BP Readings from Last 1 Encounters:  11/15/22 126/80         Passed - Valid encounter within last 6 months    Recent Outpatient Visits           1 month ago Primary hypertension   [place] [facility], Ocie Bob, MD   2 months ago Anxiety and depression   [place] [facility], Ocie Bob, MD   7 months ago Primary hypertension   [place] [facility] Larae Grooms, NP   1 year ago Anxiety and depression   [place] [facility] Lar[facility] Grooms, NP   1 year ago Anxiety and depression   [place] [facility] Larae Grooms, NP        Future Appointments             In 2 days Ashley Royalty, Ocie Bob, MD [facility] Health Primary Care & Sports Medicine at [facility]   In 2 months Marykay Lex, MD [facility] at [facility]

## 2022-11-25 ENCOUNTER — Ambulatory Visit: Payer: Medicaid Other | Admitting: Family Medicine

## 2022-11-30 ENCOUNTER — Encounter (INDEPENDENT_AMBULATORY_CARE_PROVIDER_SITE_OTHER): Payer: Medicaid Other | Admitting: Family Medicine

## 2022-11-30 DIAGNOSIS — F418 Other specified anxiety disorders: Secondary | ICD-10-CM

## 2022-11-30 MED ORDER — CARIPRAZINE HCL 1.5 MG PO CAPS
1.5000 mg | ORAL_CAPSULE | Freq: Every day | ORAL | 0 refills | Status: AC
Start: 2022-11-30 — End: ?

## 2022-11-30 NOTE — Telephone Encounter (Signed)
  Thank you for your message seeking medical advice.* My assessment and recommendation are as follows: 41  Sincerely,  Jerrol Banana, MD    *This exchange required the expertise of a doctor, nurse practitioner, physician assistant, optometrist or certified nurse midwife and qualifies as a Medical Advice Message, please visit [email or web address] for more details. Ali Molina will bill your insurance on your behalf; copays and deductibles may apply. Questions? Reply to this message.

## 2022-11-30 NOTE — Telephone Encounter (Signed)
Please advise 

## 2022-12-02 ENCOUNTER — Other Ambulatory Visit: Payer: Self-pay | Admitting: Nurse Practitioner

## 2022-12-02 ENCOUNTER — Other Ambulatory Visit: Payer: Self-pay | Admitting: Family Medicine

## 2022-12-02 NOTE — Telephone Encounter (Signed)
Requested Prescriptions  Pending Prescriptions Disp Refills   gabapentin (NEURONTIN) 300 MG capsule [Pharmacy Med Name: GABAPENTIN 300 MG CAPSULE] 60 capsule 2    Sig: TAKE 1 CAPSULE (300 MG TOTAL) BY MOUTH AT BEDTIME. START TAKING 1 CAPSULE AT BEDTIME, IF NEEDED AFTER 3 DAYS CAN INCREASE TO TWICE A DAY     Neurology: Anticonvulsants - gabapentin Passed - 12/02/2022 12:37 PM      Passed - Cr in normal range and within 360 days    Creatinine  Date Value Ref Range Status  11/02/2013 0.59 (L) 0.60 - 1.30 mg/dL Final   Creatinine, Ser  Date Value Ref Range Status  10/29/2022 0.86 0.57 - 1.00 mg/dL Final         Passed - Completed PHQ-2 or PHQ-9 in the last 360 days      Passed - Valid encounter within last 12 months    Recent Outpatient Visits           1 month ago Primary hypertension   [place] [facility], Ocie Bob, MD   2 months ago Anxiety and depression   [place] [facility], Ocie Bob, MD   7 months ago Primary hypertension   [place] [facility] Larae Grooms, NP   1 year ago Anxiety and depression   [place] [facility] Larae Grooms, NP   1 year ago Anxiety and depression   [place] [facility] Larae Grooms, NP       Future Appointments             In 3 weeks Ashley Royalty, Ocie Bob, MD [facility] Health Primary Care & Sports Medicine at [facility], [facility]   In 2 months Marykay Lex, MD [facility] at [facility]

## 2022-12-02 NOTE — Telephone Encounter (Signed)
Requested Prescriptions  Pending Prescriptions Disp Refills   hydrOXYzine (VISTARIL) 25 MG capsule [Pharmacy Med Name: HYDROXYZINE PAM 25 MG CAP] 30 capsule 1    Sig: TAKE 1 CAPSULE (25 MG TOTAL) BY MOUTH EVERY 8 (EIGHT) HOURS AS NEEDED FOR ANXIETY.     Ear, Nose, and Throat:  Antihistamines 2 Passed - 12/02/2022 12:37 PM      Passed - Cr in normal range and within 360 days    Creatinine  Date Value Ref Range Status  11/02/2013 0.59 (L) 0.60 - 1.30 mg/dL Final   Creatinine, Ser  Date Value Ref Range Status  10/29/2022 0.86 0.57 - 1.00 mg/dL Final         Passed - Valid encounter within last 12 months    Recent Outpatient Visits           1 month ago Primary hypertension   [place] [facility], Ocie Bob, MD   2 months ago Anxiety and depression   [place] [facility], Ocie Bob, MD   7 months ago Primary hypertension   [place] [facility] Larae Grooms, NP   1 year ago Anxiety and depression   [place] [facility] Larae Grooms, NP   1 year ago Anxiety and depression   [place] [facility] Larae Grooms, NP       Future Appointments             In 3 weeks Ashley Royalty, Ocie Bob, MD [place] [facility] Health Primary Care & Sports Medicine at [facility], [facility]   In 2 months Marykay Lex, MD [facility] HeartCare at [facility]

## 2022-12-07 ENCOUNTER — Ambulatory Visit (HOSPITAL_COMMUNITY): Admission: RE | Admit: 2022-12-07 | Payer: Medicaid Other | Source: Ambulatory Visit

## 2022-12-15 NOTE — Telephone Encounter (Signed)
Please see the MyChart message reply(ies) for my assessment and plan.    This patient gave consent for this Medical Advice Message and is aware that it may result in a bill to their insurance company, as well as the possibility of receiving a bill for a co-payment or deductible. They are an established patient, but are not seeking medical advice exclusively about a problem treated during an in person or video visit in the last seven days. I did not recommend an in person or video visit within seven days of my reply.    I spent a total of 22 minutes cumulative time within 7 days through MyChart messaging.  Marelly Wehrman J Zoi Devine, MD   

## 2022-12-16 ENCOUNTER — Telehealth: Payer: Self-pay

## 2022-12-16 NOTE — Telephone Encounter (Signed)
PA for [employer] started waiting on ins  RUE:[id number]

## 2022-12-29 ENCOUNTER — Ambulatory Visit: Payer: Medicaid Other | Admitting: Family Medicine

## 2023-01-03 ENCOUNTER — Encounter: Payer: Self-pay | Admitting: Family Medicine

## 2023-02-07 ENCOUNTER — Ambulatory Visit: Payer: Medicaid Other | Attending: Cardiology | Admitting: Cardiology

## 2023-02-07 ENCOUNTER — Telehealth: Payer: Self-pay

## 2023-02-07 NOTE — Telephone Encounter (Signed)
  Patient Consent for Virtual Visit   Left message on voicemail to call back for her appoinment.

## 2023-02-07 NOTE — Telephone Encounter (Signed)
  Patient Consent for Virtual Visit    Called patient no answer left message to contact office to reschedule her appointment.

## 2023-02-14 ENCOUNTER — Other Ambulatory Visit: Payer: Self-pay | Admitting: Family Medicine

## 2023-02-14 DIAGNOSIS — I10 Essential (primary) hypertension: Secondary | ICD-10-CM

## 2023-02-15 NOTE — Telephone Encounter (Signed)
Requested Prescriptions  Pending Prescriptions Disp Refills   hydrochlorothiazide (HYDRODIURIL) 25 MG tablet [Pharmacy Med Name: HYDROCHLOROTHIAZIDE 25 MG TAB] 90 tablet 0    Sig: TAKE 1 TABLET (25 MG TOTAL) BY MOUTH DAILY.     Cardiovascular: Diuretics - Thiazide Passed - 02/14/2023  3:16 AM      Passed - Cr in normal range and within 180 days    Creatinine  Date Value Ref Range Status  11/02/2013 0.59 (L) 0.60 - 1.30 mg/dL Final   Creatinine, Ser  Date Value Ref Range Status  10/29/2022 0.86 0.57 - 1.00 mg/dL Final         Passed - K in normal range and within 180 days    Potassium  Date Value Ref Range Status  10/29/2022 4.0 3.5 - 5.2 mmol/L Final  11/02/2013 4.2 3.5 - 5.1 mmol/L Final         Passed - Na in normal range and within 180 days    Sodium  Date Value Ref Range Status  10/29/2022 135 134 - 144 mmol/L Final  11/02/2013 137 136 - 145 mmol/L Final         Passed - Last BP in normal range    BP Readings from Last 1 Encounters:  11/15/22 126/80         Passed - Valid encounter within last 6 months    Recent Outpatient Visits           4 months ago Primary hypertension   [place] [facility] Ocie Bob, MD   5 months ago Anxiety and depression   [place] [facility], Ocie Bob, MD   9 months ago Primary hypertension   [place] [facility] Larae Grooms, NP   1 year ago Anxiety and depression   [place] [facility] Lar[facility] Grooms, NP   1 year ago Anxiety and depression   [place] [facility] Larae Grooms, NP

## 2023-03-16 DIAGNOSIS — E063 Autoimmune thyroiditis: Secondary | ICD-10-CM | POA: Diagnosis not present

## 2023-03-24 DIAGNOSIS — L678 Other hair color and hair shaft abnormalities: Secondary | ICD-10-CM | POA: Diagnosis not present

## 2023-03-24 DIAGNOSIS — E063 Autoimmune thyroiditis: Secondary | ICD-10-CM | POA: Diagnosis not present

## 2023-04-11 ENCOUNTER — Other Ambulatory Visit: Payer: Self-pay | Admitting: Family Medicine

## 2023-04-12 ENCOUNTER — Ambulatory Visit: Payer: Medicaid Other | Admitting: Family Medicine

## 2023-04-12 ENCOUNTER — Encounter: Payer: Self-pay | Admitting: Family Medicine

## 2023-04-12 NOTE — Telephone Encounter (Signed)
Please review.  KP

## 2023-04-13 ENCOUNTER — Ambulatory Visit: Payer: Medicaid Other | Admitting: Family Medicine

## 2023-04-13 NOTE — Telephone Encounter (Signed)
Requested Prescriptions  Pending Prescriptions Disp Refills   traZODone (DESYREL) 50 MG tablet [Pharmacy Med Name: TRAZODONE 50 MG TABLET] 30 tablet 1    Sig: TAKE 1/2 TO 1 TABLET BY MOUTH AT BEDTIME AS NEEDED FOR SLEEP     Psychiatry: Antidepressants - Serotonin Modulator Failed - 04/11/2023  6:24 PM      Failed - Valid encounter within last 6 months    Recent Outpatient Visits           5 months ago Primary hypertension   [place] [facility] at [facility] Ocie Bob, MD   7 months ago Anxiety and depression   [place] [facility] at [facility] Ocie Bob, MD   11 months ago Primary hypertension   [place] [facility] Larae Grooms, NP   1 year ago Anxiety and depression   [place] [facility] Larae Grooms, NP   1 year ago Anxiety and depression   [place] [facility] Larae Grooms, NP       Future Appointments             In 1 week Ashley Royalty, Ocie Bob, MD [facility] Health Primary Care & Sports Medicine at [facility], [facility]   In 1 month Ashley Royalty, Ocie Bob, MD [facility] Primary Care & Sports Medicine at [facility]            Passed - Completed PHQ-2 or PHQ-9 in the last 360 days

## 2023-04-26 ENCOUNTER — Encounter: Payer: Self-pay | Admitting: Family Medicine

## 2023-04-26 ENCOUNTER — Ambulatory Visit: Payer: Medicaid Other | Admitting: Family Medicine

## 2023-04-26 VITALS — BP 128/80 | HR 75 | Ht 63.0 in | Wt 150.0 lb

## 2023-04-26 DIAGNOSIS — F418 Other specified anxiety disorders: Secondary | ICD-10-CM

## 2023-04-26 MED ORDER — CARIPRAZINE HCL 1.5 MG PO CAPS
1.5000 mg | ORAL_CAPSULE | Freq: Every day | ORAL | 0 refills | Status: DC
Start: 2023-04-26 — End: 2024-02-02

## 2023-04-26 NOTE — Progress Notes (Signed)
     Primary Care / Sports Medicine Office Visit  Patient Information:  Patient ID: NASIRA JANUSZ, female DOB: 06-02-1986 Age: 37 y.o. MRN: [id number]   RAYYA YAGI is a pleasant 37 y.o. female presenting with the following:  Chief Complaint  Patient presents with   Anxiety    Vitals:   04/26/23 0850  BP: 128/80  Pulse: 75  SpO2: 97%   Vitals:   04/26/23 0850  Weight: 150 lb (68 kg)  Height: 5\' 3"  (1.6 m)   Body mass index is 26.57 kg/m.  No results found.   Independent interpretation of notes and tests performed by another provider:   None  Procedures performed:   None  Pertinent History, Exam, Impression, and Recommendations:   Problem List Items Addressed This Visit       Other   Depression with anxiety - Primary    Chronic, without adequate control, most recent advised Vraylar which patient did not start, has self discontinued all medications and has noted both anxiety and depressive symptoms, PHQ and GAD scores reviewed.  Denies any SI/HI.  Plan: - Referral to psychiatry - Start of Vraylar      Relevant Medications   cariprazine (VRAYLAR) 1.5 MG capsule   Other Relevant Orders   Ambulatory referral to Psychiatry     Orders & Medications Medications:  Meds ordered this encounter  Medications   cariprazine (VRAYLAR) 1.5 MG capsule    Sig: Take 1 capsule (1.5 mg total) by mouth daily.    Dispense:  90 capsule    Refill:  0   Orders Placed This Encounter  Procedures   Ambulatory referral to Psychiatry     No follow-ups on file.     Jerrol Banana, MD, [facility]   Primary Care Sports Medicine Primary Care and Sports Medicine at [facility]

## 2023-04-26 NOTE — Patient Instructions (Addendum)
-   Start Vraylar daily - Referral coordinator will contact you to schedule visit with psychiatry - Return for scheduled physical - Contact our office for any question/concerns between now and then

## 2023-04-26 NOTE — Assessment & Plan Note (Signed)
Chronic, without adequate control, most recent advised Vraylar which patient did not start, has self discontinued all medications and has noted both anxiety and depressive symptoms, PHQ and GAD scores reviewed.  Denies any SI/HI.  Plan: - Referral to psychiatry - Start of [employer]

## 2023-05-17 ENCOUNTER — Encounter: Payer: Self-pay | Admitting: Family Medicine

## 2023-05-17 ENCOUNTER — Ambulatory Visit (INDEPENDENT_AMBULATORY_CARE_PROVIDER_SITE_OTHER): Payer: Medicaid Other | Admitting: Family Medicine

## 2023-05-17 VITALS — BP 120/74 | HR 63 | Ht 63.0 in | Wt 154.0 lb

## 2023-05-17 DIAGNOSIS — E079 Disorder of thyroid, unspecified: Secondary | ICD-10-CM

## 2023-05-17 DIAGNOSIS — Z131 Encounter for screening for diabetes mellitus: Secondary | ICD-10-CM

## 2023-05-17 DIAGNOSIS — R7301 Impaired fasting glucose: Secondary | ICD-10-CM

## 2023-05-17 DIAGNOSIS — F418 Other specified anxiety disorders: Secondary | ICD-10-CM

## 2023-05-17 DIAGNOSIS — E559 Vitamin D deficiency, unspecified: Secondary | ICD-10-CM

## 2023-05-17 DIAGNOSIS — L989 Disorder of the skin and subcutaneous tissue, unspecified: Secondary | ICD-10-CM | POA: Insufficient documentation

## 2023-05-17 DIAGNOSIS — I10 Essential (primary) hypertension: Secondary | ICD-10-CM | POA: Diagnosis not present

## 2023-05-17 DIAGNOSIS — Z0000 Encounter for general adult medical examination without abnormal findings: Secondary | ICD-10-CM | POA: Insufficient documentation

## 2023-05-17 DIAGNOSIS — M5412 Radiculopathy, cervical region: Secondary | ICD-10-CM | POA: Diagnosis not present

## 2023-05-17 DIAGNOSIS — R7989 Other specified abnormal findings of blood chemistry: Secondary | ICD-10-CM | POA: Diagnosis not present

## 2023-05-17 DIAGNOSIS — D649 Anemia, unspecified: Secondary | ICD-10-CM

## 2023-05-17 DIAGNOSIS — E785 Hyperlipidemia, unspecified: Secondary | ICD-10-CM | POA: Diagnosis not present

## 2023-05-17 MED ORDER — TRIAMCINOLONE ACETONIDE 0.1 % EX OINT: 1.0000 | TOPICAL_OINTMENT | Freq: Two times a day (BID) | CUTANEOUS | 0 refills | Status: DC

## 2023-05-17 NOTE — Assessment & Plan Note (Signed)
Endorsing some fluctuating energy levels and brittle hair, has been compliant with levothyroxine.   Plan: - Updated TFTs

## 2023-05-17 NOTE — Assessment & Plan Note (Signed)
Patient has excellent BP readings today, in fact states that she did not take her hydrochlorothiazide today. She has cut out alcohol and stress is doing much better with Vraylar.  Plan: - Risk stratification labs - Continue healthy lifestyle changes - 3 month follow-up

## 2023-05-17 NOTE — Assessment & Plan Note (Signed)
Has not heard anything regarding recent referral and I have advised patient to independently seek group and we can coordinate referral accordingly. She has started Vraylar 1.5 mg and noting significant benefit. Some afternoon fatigue with her current AM dosing.  Plan: - Can contact us if wanting a new referral to a psychiatry group of your choosing - Adjust timing of Vraylar to avoid the tiredness - Return in 3 months

## 2023-05-17 NOTE — Assessment & Plan Note (Signed)
Annual examination completed, risk stratification labs ordered, anticipatory guidance provided.  We will follow labs once resulted. 

## 2023-05-17 NOTE — Assessment & Plan Note (Signed)
Chronic, 5 years, symptomatic with intermittent pruritus, some paresthesia at the site. See image:    Document Information  Photos  Left posterior shoulder  05/17/2023 08:46  Attached To:  Office Visit on 05/17/23 with Jerrol Banana, MD  Source Information  Jerrol Banana, MD  [facility]  Document History    Differential to include Dermatofibroma, seborrheic keratosis, among other skin pathologies.   Plan: - Can trial topical steroid BID x 2 weeks if beneficial for itch - Referral to dermatology for evaluation and definitive management options

## 2023-05-17 NOTE — Patient Instructions (Addendum)
-   Obtain fasting labs with orders provided (can have water or black coffee but otherwise no food or drink x 8 hours before labs) - Review information provided - Attend eye doctor annually, dentist every 6 months, work towards or maintain 30 minutes of moderate intensity physical activity at least 5 days per week, and consume a balanced diet - Contact us for any questions between now and then  Additionally: - Can contact us if wanting a new referral to a psychiatry group of your choosing - Adjust timing of Vraylar to avoid the tiredness - Referral to PT and dermatology placed - Can try steroid cream and continue x 2 weeks total if beneficial - Return in 3 months

## 2023-05-17 NOTE — Assessment & Plan Note (Signed)
Ongoing symptoms, had attended 1 session of PT in the past and is wishing to restart. Having some symptom control with gabapentin.   Plan: - Continue current medications - Referral to PT placed

## 2023-05-17 NOTE — Progress Notes (Signed)
Annual Physical Exam Visit  Patient Information:  Patient ID: Maureen Hoffman, female DOB: 1985-09-20 Age: 37 y.o. MRN: [id number]   Subjective:   CC: Annual Physical Exam  HPI:  Maureen Hoffman is here for their annual physical.  I reviewed the past medical history, family history, social history, surgical history, and allergies today and changes were made as necessary.  Please see the problem list section below for additional details.  Past Medical History: Past Medical History:  Diagnosis Date   Hashimoto's disease 2020   Is now on Synthroid although she still apparently has antibodies   Hypothyroidism (acquired)    Mood swings    Seizures (HCC)    as a child   Thrombocytopenia affecting pregnancy, antepartum (HCC) 12/03/2016   Thrombocytopenia complicating pregnancy (HCC)    2018   Past Surgical History: Past Surgical History:  Procedure Laterality Date   NO PAST SURGERIES     Family History: Family History  Problem Relation Age of Onset   Healthy Mother    Colon cancer Father    Hashimoto's thyroiditis Sister    Colon cancer Paternal Uncle    Breast cancer Maternal Grandmother    Diabetes Maternal Grandmother    Breast cancer Paternal Grandmother    COPD Paternal Grandmother    Brain cancer Paternal Grandfather    Allergies: No Known Allergies Health Maintenance: Health Maintenance  Topic Date Due   COVID-19 Vaccine (2 - 2023-24 season) 03/27/2023   INFLUENZA VACCINE  10/24/2023 (Originally 02/24/2023)   Cervical Cancer Screening (HPV/Pap Cotest)  01/21/2025   DTaP/Tdap/Td (2 - Td or Tdap) 10/02/2026   Hepatitis C Screening  Completed   HIV Screening  Completed   HPV VACCINES  Aged Out    HM Colonoscopy     This patient has no relevant Health Maintenance data.      Medications: Current Outpatient Medications on File Prior to Visit  Medication Sig Dispense Refill   cariprazine (VRAYLAR) 1.5 MG capsule Take 1 capsule (1.5 mg total) by mouth  daily. 90 capsule 0   fluticasone (FLONASE) 50 MCG/ACT nasal spray SPRAY 2 SPRAYS INTO EACH NOSTRIL EVERY DAY 16 mL 0   gabapentin (NEURONTIN) 300 MG capsule TAKE 1 CAPSULE (300 MG TOTAL) BY MOUTH AT BEDTIME. START TAKING 1 CAPSULE AT BEDTIME, IF NEEDED AFTER 3 DAYS CAN INCREASE TO TWICE A DAY 60 capsule 2   hydrochlorothiazide (HYDRODIURIL) 25 MG tablet TAKE 1 TABLET (25 MG TOTAL) BY MOUTH DAILY. 90 tablet 0   levothyroxine (SYNTHROID) 50 MCG tablet Take 1 tablet (50 mcg total) by mouth daily. (Patient taking differently: Take 75 mcg by mouth daily.) 90 tablet 1   norgestimate-ethinyl estradiol (ORTHO-CYCLEN) 0.25-35 MG-MCG tablet Take 1 tablet by mouth daily. 84 tablet 3   traZODone (DESYREL) 50 MG tablet TAKE 1/2 TO 1 TABLET BY MOUTH AT BEDTIME AS NEEDED FOR SLEEP 30 tablet 1   No current facility-administered medications on file prior to visit.    Objective:   Vitals:   05/17/23 0822  BP: 120/74  Pulse: 63  SpO2: 100%   Vitals:   05/17/23 0822  Weight: 154 lb (69.9 kg)  Height: 5\' 3"  (1.6 m)   Body mass index is 27.28 kg/m.  General: Well Developed, well nourished, and in no acute distress.  Neuro: Alert and oriented x3, extra-ocular muscles intact, sensation grossly intact. Cranial nerves II through XII are grossly intact, motor, sensory, and coordinative functions are intact. HEENT: Normocephalic, atraumatic, neck supple,  no masses, no lymphadenopathy, thyroid nonenlarged. Oropharynx, nasopharynx, external ear canals are unremarkable. Skin: Warm and dry, no rashes noted. Lesion of posterior shoulder / upper back. Cardiac: Regular rate and rhythm, no murmurs rubs or gallops. No peripheral edema. Pulses symmetric. Respiratory: Clear to auscultation bilaterally. Speaking in full sentences.  Abdominal: Soft, nontender, nondistended, positive bowel sounds, no masses, no organomegaly. Musculoskeletal: Stable, and with full range of motion.   Impression and Recommendations:    The patient was counselled, risk factors were discussed, and anticipatory guidance given.  Problem List Items Addressed This Visit       Cardiovascular and Mediastinum   Hypertension (Chronic)    Patient has excellent BP readings today, in fact states that she did not take her hydrochlorothiazide today. She has cut out alcohol and stress is doing much better with Vraylar.  Plan: - Risk stratification labs - Continue healthy lifestyle changes - 3 month follow-up      Relevant Orders   Apo A1 + B + Ratio   Comprehensive metabolic panel   Lipid panel   TSH     Endocrine   Thyroid disease (Chronic)    Endorsing some fluctuating energy levels and brittle hair, has been compliant with levothyroxine.   Plan: - Updated TFTs      Relevant Orders   TSH   T3, free   T4, free     Nervous and Auditory   Left cervical radiculopathy    Ongoing symptoms, had attended 1 session of PT in the past and is wishing to restart. Having some symptom control with gabapentin.   Plan: - Continue current medications - Referral to PT placed      Relevant Orders   Ambulatory referral to Physical Therapy     Musculoskeletal and Integument   Skin lesion of back    Chronic, 5 years, symptomatic with intermittent pruritus, some paresthesia at the site. See image:    Document Information  Photos  Left posterior shoulder  05/17/2023 08:46  Attached To:  Office Visit on 05/17/23 with Jerrol Banana, MD  Source Information  Jerrol Banana, MD  [facility] [place]  Document History    Differential to include Dermatofibroma, seborrheic keratosis, among other skin pathologies.   Plan: - Can trial topical steroid BID x 2 weeks if beneficial for itch - Referral to dermatology for evaluation and definitive management options      Relevant Medications   triamcinolone ointment (KENALOG) 0.1 %   Other Relevant Orders   Ambulatory referral to Dermatology     Other   Healthcare  maintenance - Primary    Annual examination completed, risk stratification labs ordered, anticipatory guidance provided.  We will follow labs once resulted.      Relevant Orders   Apo A1 + B + Ratio   CBC   Comprehensive metabolic panel   Hemoglobin A1c   Lipid panel   TSH   VITAMIN D 25 Hydroxy (Vit-D Deficiency, Fractures)   Iron, TIBC and Ferritin Panel   T3, free   T4, free   Depression with anxiety    Has not heard anything regarding recent referral and I have advised patient to independently seek group and we can coordinate referral accordingly. She has started Vraylar 1.5 mg and noting significant benefit. Some afternoon fatigue with her current AM dosing.  Plan: - Can contact us if wanting a new referral to a psychiatry group of your choosing - Adjust timing of Vraylar to avoid the tiredness -  Return in 3 months      Other Visit Diagnoses     Elevated fasting glucose       Relevant Orders   Comprehensive metabolic panel   Hemoglobin A1c   Elevated TSH       Relevant Orders   TSH   T3, free   T4, free   Vitamin D deficiency       Relevant Orders   VITAMIN D 25 Hydroxy (Vit-D Deficiency, Fractures)   Hyperlipidemia, unspecified hyperlipidemia type       Relevant Orders   Apo A1 + B + Ratio   Comprehensive metabolic panel   Lipid panel   Anemia, unspecified type       Relevant Orders   CBC   Iron, TIBC and Ferritin Panel   Screening for diabetes mellitus       Relevant Orders   Comprehensive metabolic panel   Hemoglobin A1c        Orders & Medications Medications:  Meds ordered this encounter  Medications   triamcinolone ointment (KENALOG) 0.1 %    Sig: Apply 1 Application topically 2 (two) times daily. Apply sparingly to affected areas for a maximum of 2 weeks    Dispense:  30 g    Refill:  0   Orders Placed This Encounter  Procedures   Apo A1 + B + Ratio   CBC   Comprehensive metabolic panel   Hemoglobin A1c   Lipid panel   TSH   VITAMIN  D 25 Hydroxy (Vit-D Deficiency, Fractures)   Iron, TIBC and Ferritin Panel   T3, free   T4, free   Ambulatory referral to Physical Therapy   Ambulatory referral to Dermatology     Return in about 3 months (around 08/17/2023) for f/u medications.    Jerrol Banana, MD, [facility]   Primary Care Sports Medicine Primary Care and Sports Medicine at [facility]

## 2023-05-18 LAB — HEMOGLOBIN A1C
Est. average glucose Bld gHb Est-mCnc: 111 mg/dL
Hgb A1c MFr Bld: 5.5 % (ref 4.8–5.6)

## 2023-05-18 LAB — COMPREHENSIVE METABOLIC PANEL
ALT: 10 [IU]/L (ref 0–32)
AST: 17 [IU]/L (ref 0–40)
Albumin: 4.1 g/dL (ref 3.9–4.9)
Alkaline Phosphatase: 61 [IU]/L (ref 44–121)
BUN/Creatinine Ratio: 16 (ref 9–23)
BUN: 12 mg/dL (ref 6–20)
Bilirubin Total: 0.2 mg/dL (ref 0.0–1.2)
CO2: 21 mmol/L (ref 20–29)
Calcium: 9.3 mg/dL (ref 8.7–10.2)
Chloride: 102 mmol/L (ref 96–106)
Creatinine, Ser: 0.77 mg/dL (ref 0.57–1.00)
Globulin, Total: 2.7 g/dL (ref 1.5–4.5)
Glucose: 98 mg/dL (ref 70–99)
Potassium: 4.2 mmol/L (ref 3.5–5.2)
Sodium: 140 mmol/L (ref 134–144)
Total Protein: 6.8 g/dL (ref 6.0–8.5)
eGFR: 101 mL/min/{1.73_m2} (ref >59)

## 2023-05-18 LAB — T3, FREE: T3, Free: 2.9 pg/mL (ref 2.0–4.4)

## 2023-05-18 LAB — LIPID PANEL
Chol/HDL Ratio: 3.6 ratio (ref 0.0–4.4)
Cholesterol, Total: 218 mg/dL — ABNORMAL HIGH (ref 100–199)
HDL: 60 mg/dL (ref >39)
LDL Chol Calc (NIH): 120 mg/dL — ABNORMAL HIGH (ref 0–99)
Triglycerides: 216 mg/dL — ABNORMAL HIGH (ref 0–149)
VLDL Cholesterol Cal: 38 mg/dL (ref 5–40)

## 2023-05-18 LAB — CBC
Hematocrit: 33.8 % — ABNORMAL LOW (ref 34.0–46.6)
Hemoglobin: 10.4 g/dL — ABNORMAL LOW (ref 11.1–15.9)
MCH: 25.5 pg — ABNORMAL LOW (ref 26.6–33.0)
MCHC: 30.8 g/dL — ABNORMAL LOW (ref 31.5–35.7)
MCV: 83 fL (ref 79–97)
Platelets: 150 10*3/uL (ref 150–450)
RBC: 4.08 x10E6/uL (ref 3.77–5.28)
RDW: 13.9 % (ref 11.7–15.4)
WBC: 7.6 10*3/uL (ref 3.4–10.8)

## 2023-05-18 LAB — APO A1 + B + RATIO
Apolipo. B/A-1 Ratio: 0.5 ratio (ref 0.0–0.6)
Apolipoprotein A-1: 195 mg/dL (ref 116–209)
Apolipoprotein B: 106 mg/dL — ABNORMAL HIGH (ref <90)

## 2023-05-18 LAB — TSH: TSH: 2.47 u[IU]/mL (ref 0.450–4.500)

## 2023-05-18 LAB — IRON,TIBC AND FERRITIN PANEL
Ferritin: 8 ng/mL — ABNORMAL LOW (ref 15–150)
Iron Saturation: 13 % — ABNORMAL LOW (ref 15–55)
Iron: 66 ug/dL (ref 27–159)
Total Iron Binding Capacity: 501 ug/dL — ABNORMAL HIGH (ref 250–450)
UIBC: 435 ug/dL — ABNORMAL HIGH (ref 131–425)

## 2023-05-18 LAB — VITAMIN D 25 HYDROXY (VIT D DEFICIENCY, FRACTURES): Vit D, 25-Hydroxy: 32.9 ng/mL (ref 30.0–100.0)

## 2023-05-18 LAB — T4, FREE: Free T4: 1.11 ng/dL (ref 0.82–1.77)

## 2023-05-19 ENCOUNTER — Other Ambulatory Visit: Payer: Self-pay | Admitting: Family Medicine

## 2023-05-19 ENCOUNTER — Encounter: Payer: Self-pay | Admitting: Family Medicine

## 2023-05-19 DIAGNOSIS — I10 Essential (primary) hypertension: Secondary | ICD-10-CM

## 2023-05-20 NOTE — Telephone Encounter (Signed)
Requested Prescriptions  Pending Prescriptions Disp Refills   hydrochlorothiazide (HYDRODIURIL) 25 MG tablet [Pharmacy Med Name: HYDROCHLOROTHIAZIDE 25 MG TAB] 90 tablet 0    Sig: TAKE 1 TABLET (25 MG TOTAL) BY MOUTH DAILY.     Cardiovascular: Diuretics - Thiazide Passed - 05/19/2023  1:32 AM      Passed - Cr in normal range and within 180 days    Creatinine  Date Value Ref Range Status  11/02/2013 0.59 (L) 0.60 - 1.30 mg/dL Final   Creatinine, Ser  Date Value Ref Range Status  05/17/2023 0.77 0.57 - 1.00 mg/dL Final         Passed - K in normal range and within 180 days    Potassium  Date Value Ref Range Status  05/17/2023 4.2 3.5 - 5.2 mmol/L Final  11/02/2013 4.2 3.5 - 5.1 mmol/L Final         Passed - Na in normal range and within 180 days    Sodium  Date Value Ref Range Status  05/17/2023 140 134 - 144 mmol/L Final  11/02/2013 137 136 - 145 mmol/L Final         Passed - Last BP in normal range    BP Readings from Last 1 Encounters:  05/17/23 120/74         Passed - Valid encounter within last 6 months    Recent Outpatient Visits           3 days ago Healthcare maintenance   [facility] Primary Care & Sports Medicine at [facility], Ocie Bob, MD   3 weeks ago Depression with anxiety   [place] [facility] & Sports Medicine at [facility] Ocie Bob, MD   7 months ago Primary hypertension   [place] Primary Care & Sports Medicine at [facility] Ocie Bob, MD   8 months ago Anxiety and depression   [facility] Primary Care & Sports Medicine at [facility] Ocie Bob, MD   1 year ago Primary hypertension   [place] [facility] Larae Grooms, NP       Future Appointments             In 2 months Ashley Royalty, Ocie Bob, MD [facility] Primary Care & Sports Medicine at [facility], [facility]

## 2023-06-08 ENCOUNTER — Other Ambulatory Visit: Payer: Self-pay | Admitting: Family Medicine

## 2023-06-09 NOTE — Telephone Encounter (Signed)
Requested Prescriptions  Pending Prescriptions Disp Refills   traZODone (DESYREL) 50 MG tablet [Pharmacy Med Name: TRAZODONE 50 MG TABLET] 90 tablet 0    Sig: TAKE 1/2 TO 1 TABLET BY MOUTH AT BEDTIME AS NEEDED FOR SLEEP     Psychiatry: Antidepressants - Serotonin Modulator Passed - 06/08/2023 12:15 PM      Passed - Completed PHQ-2 or PHQ-9 in the last 360 days      Passed - Valid encounter within last 6 months    Recent Outpatient Visits           3 weeks ago Healthcare maintenance   [facility] Primary Care & Sports Medicine at [facility], Ocie Bob, MD   1 month ago Depression with anxiety   [place] Primary Care & Sports Medicine at [facility], Ocie Bob, MD   7 months ago Primary hypertension   [place] Primary Care & Sports Medicine at [facility] Ocie Bob, MD   9 months ago Anxiety and depression   [facility] Primary Care & Sports Medicine at [facility] Ocie Bob, MD   1 year ago Primary hypertension   [place] [facility] Larae Grooms, NP       Future Appointments             In 2 months Ashley Royalty, Ocie Bob, MD [facility] Health Primary Care & Sports Medicine at [facility], [facility]

## 2023-06-22 ENCOUNTER — Other Ambulatory Visit: Payer: Self-pay | Admitting: Family Medicine

## 2023-06-22 NOTE — Progress Notes (Signed)
This encounter was created in error - please disregard.

## 2023-06-27 NOTE — Telephone Encounter (Signed)
Requested Prescriptions  Pending Prescriptions Disp Refills   traZODone (DESYREL) 50 MG tablet [Pharmacy Med Name: TRAZODONE 50 MG TABLET] 270 tablet 1    Sig: TAKE 1/2 TO 1 TABLET BY MOUTH AT BEDTIME AS NEEDED FOR SLEEP     Psychiatry: Antidepressants - Serotonin Modulator Passed - 06/22/2023  7:26 PM      Passed - Completed PHQ-2 or PHQ-9 in the last 360 days      Passed - Valid encounter within last 6 months    Recent Outpatient Visits           1 month ago Healthcare maintenance   [facility] Primary Care & Sports Medicine at [facility] Ocie Bob, MD   2 months ago Depression with anxiety   [place] Primary Care & Sports Medicine at [facility], Ocie Bob, MD   8 months ago Primary hypertension   [place] Primary Care & Sports Medicine at [facility] Ocie Bob, MD   9 months ago Anxiety and depression   [facility] Primary Care & Sports Medicine [facility] Ocie Bob, MD   1 year ago Primary hypertension   [place] [facility] Larae Grooms, NP       Future Appointments             In 1 month Ashley Royalty, Ocie Bob, MD [facility] Health Primary Care & Sports Medicine at [facility], [facility]

## 2023-06-29 ENCOUNTER — Ambulatory Visit: Payer: Medicaid Other

## 2023-07-04 ENCOUNTER — Ambulatory Visit: Payer: Medicaid Other | Attending: Family Medicine

## 2023-07-04 DIAGNOSIS — M542 Cervicalgia: Secondary | ICD-10-CM | POA: Insufficient documentation

## 2023-07-06 ENCOUNTER — Other Ambulatory Visit: Payer: Self-pay | Admitting: Nurse Practitioner

## 2023-07-07 ENCOUNTER — Ambulatory Visit: Payer: Medicaid Other

## 2023-07-07 NOTE — Telephone Encounter (Signed)
Requested medication (s) are due for refill today: yes  Requested medication (s) are on the active medication list: yes  Last refill:  12/02/22 #60 2 RF  Future visit scheduled: no  Notes to clinic:  dose is tirated and med last reordered  12/02/22   Requested Prescriptions  Pending Prescriptions Disp Refills   gabapentin (NEURONTIN) 300 MG capsule [Pharmacy Med Name: GABAPENTIN 300 MG CAPSULE] 60 capsule 2    Sig: TAKE 1 CAPSULE (300 MG TOTAL) BY MOUTH AT BEDTIME. START TAKING 1 CAPSULE AT BEDTIME, IF NEEDED AFTER 3 DAYS CAN INCREASE TO TWICE A DAY     Neurology: Anticonvulsants - gabapentin Passed - 07/07/2023  9:06 AM      Passed - Cr in normal range and within 360 days    Creatinine  Date Value Ref Range Status  11/02/2013 0.59 (L) 0.60 - 1.30 mg/dL Final   Creatinine, Ser  Date Value Ref Range Status  05/17/2023 0.77 0.57 - 1.00 mg/dL Final         Passed - Completed PHQ-2 or PHQ-9 in the last 360 days      Passed - Valid encounter within last 12 months    Recent Outpatient Visits           1 month ago Healthcare maintenance   [facility] Health Primary Care & Sports Medicine at [facility], Ocie Bob, MD   2 months ago Depression with anxiety   [facility] Primary Care & Sports Medicine at [facility] Ocie Bob, MD   8 months ago Primary hypertension   [place] Primary Care & Sports Medicine [facility] Ocie Bob, MD   10 months ago Anxiety and depression   [facility] Primary Care & Sports Medicine [facility] Ocie Bob, MD   1 year ago Primary hypertension   [place] [facility] Larae Grooms, NP       Future Appointments             In 1 month Ashley Royalty, Ocie Bob, MD [facility] Health Primary Care & Sports Medicine at [facility], [facility]

## 2023-07-07 NOTE — Telephone Encounter (Signed)
Please review.  KP

## 2023-07-07 NOTE — Therapy (Incomplete)
OUTPATIENT PHYSICAL THERAPY NECK EVALUATION  Patient Name: Maureen Hoffman MRN: [id number] DOB:07/15/1986, 37 y.o., female Today's Date: 07/07/2023    Past Medical History:  Diagnosis Date   Hashimoto's disease 2020   Is now on Synthroid although she still apparently has antibodies   Hypothyroidism (acquired)    Mood swings    Seizures (HCC)    as a child   Thrombocytopenia affecting pregnancy, antepartum (HCC) 12/03/2016   Thrombocytopenia complicating pregnancy (HCC)    2018   Past Surgical History:  Procedure Laterality Date   NO PAST SURGERIES     Patient Active Problem List   Diagnosis Date Noted   Skin lesion of back 05/17/2023   Healthcare maintenance 05/17/2023   Left cervical radiculopathy 05/08/2021   Hypertension 09/02/2020   Thyroid disease 09/02/2020   Depression with anxiety 11/13/2019    PCP: Jerrol Banana, MD  REFERRING PROVIDER: Jerrol Banana, MD  REFERRING DIAGNOSIS: M54.12 (ICD-10-CM) - Left cervical radiculopathy   THERAPY DIAG: Cervicalgia  RATIONALE FOR EVALUATION AND TREATMENT: Rehabilitation  ONSET DATE: 09/2020  FOLLOW UP APPT WITH PROVIDER: Yes    SUBJECTIVE:                                                                                                                                                                                         Chief Complaint: Neck pain  Pertinent History Pt referred to PT for chronic neck pain which started 2 years ago (March 2022). She woke up one morning and her neck felt stiff. No history of neck trauma and symptoms are unchanged since onset. The pain is constant and generally worsens throughout the day. Sometimes it is a mild nuisance and other times it is very limiting to her daily activities including a progressive decrease in her cervical rotation. Pain is located in the bilateral cervical spine and it radiates down to the shoulder blades as well as into the top of the shoulders. She also  gets intermittent pain (shocking sensation) as well as N/T which radiates down the LUE into digits 4 and 5. LUE symptoms occur 2-3 times/wk for 30 minutes at a time. Pt reports regular stiffness and popping in her neck and notices improvement in her symptoms when her neck pops. The pain causes her regular headaches. She has a past history migraines but not recently. History of HTN and hypothyroidism. She reports occasional BUE tremor in her hands. Pt complains of difficulty sleeping which has improved slightly since starting the medication prescribed by Dr. Ashley Royalty. She has restless legs which wake her up at night and believes she might have sleep apnea because she snores at  night and occasionally wakes up gasping for air. She gets occasional dizziness/rocking when bending forward or turning quickly and reports a couple episodes per year of vertigo when rolling in bed. No family history of autoimmune diseases. Grandmother with severe osteoporosis. Her father underwent a cervical fusion.   Cervical radiographs: 09/08/22: FINDINGS: Seven cervical segments are well visualized. Loss of the normal cervical lordosis is noted but stable from the prior CT examination. Vertebral body height is well maintained. No significant neural foraminal narrowing is seen on the right. Very mild neural foraminal narrowing is noted at C4-5 and C5-6 on the left. The odontoid is within normal limits. No acute fracture is noted.   Pain:  Pain Intensity: Present: 1/10, Best: 1/10, Worst: 8/10 Pain location: Bilateral cervical paraspinals radiating down to the top of the shoulders and down the LUE Pain Quality:  stiffness with intermittent sharp pain  "Aching all day long" Radiating: Yes, see above history Numbness/Tingling: Yes, see above history Focal Weakness: No Aggravating factors: cervical rotation, forward flexion, extended sitting Relieving factors: moving her neck, massage, heating pad 24-hour pain behavior: worsens as  the day progresses History of prior neck injury, pain, surgery, or therapy: No Falls: Has patient fallen in last 6 months? 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  Recent memory is intact.  Remote memory is intact.  Attention span and concentration are intact.  Expressive speech is intact.  Patient's fund of knowledge is within normal limits for educational level.    Gross Musculoskeletal Assessment Tremor: Mild BUE hand tremor noted; Bulk: Normal Tone: Normal  Gait Deferred  Posture Forward head and rounded shoulders;  AROM AROM (Normal range in degrees) AROM 09/23/2022  Cervical  Flexion (50) 50  Extension (80) 45*  Right lateral flexion (45) 45  Left lateral flexion (45) 40*  Right rotation (85) 60*  Left rotation (85) 70*  (* = pain; Blank rows = not tested)   MMT MMT (out of 5) Right 09/23/2022 Left 09/23/2022  Cervical (isometric)  Flexion WNL  Extension WNL  Lateral Flexion WNL* WNL  Rotation WNL WNL      Shoulder   Flexion 5* 5*  Extension    Abduction 5 5  Internal rotation 5 5  Horizontal abduction    Horizontal adduction    Lower Trapezius    Rhomboids        Elbow  Flexion 5 5  Extension 5 5  Pronation    Supination        Wrist  Flexion 5 5   Extension 5 5  Radial deviation    Ulnar deviation        MCP  Flexion 5 5  Extension 5 5  Abduction 5 5  Adduction 5 5  (* = pain; Blank rows = not tested)  Sensation Decreased sensation in  LUE dermatomes C5 and C8. Otherwise sensation throughout BUE is intact and symmetrical to light touch sensation   Reflexes Deferred  Palpation Location LEFT  RIGHT           Suboccipitals 1 1  Cervical paraspinals 1 1  Upper Trapezius 1 1  Levator Scapulae 0 0  Rhomboid Major/Minor 0 0  (Blank rows = not tested) Graded on 0-4 scale (0 = no pain, 1 = pain, 2 = pain with wincing/grimacing/flinching, 3 = pain with withdrawal, 4 = unwilling to allow palpation), (Blank rows = not tested)  Repeated Movements No centralization or peripheralization of symptoms with repeated cervical retraction;  Passive Accessory Intervertebral Motion Deferred   SPECIAL TESTS Spurlings A (ipsilateral lateral flexion/axial compression): R: Negative L: Negative Spurlings B (ipsilateral lateral flexion/contralateral rotation/axial compression): R: Negative L: Negative Distraction Test: Positive for pain relief  Hoffman Sign (cervical cord compression): R: Negative L: Negative ULTT Median: R: Not examined L: Positive ULTT Ulnar: R: Not examined L: Positive ULTT Radial: R: Not examined L: Negative   TODAY'S TREATMENT  Deferred   PATIENT EDUCATION:  Education details: Plan of care Person educated: Patient Education method: Explanation Education comprehension: verbalized understanding   HOME EXERCISE PROGRAM: None currently   ASSESSMENT:  CLINICAL IMPRESSION: Patient is a 37 y.o. female who was seen today for physical therapy evaluation and treatment for neck pain. She is very tender to palpation along bilateral cervical spine, suboccipitals, and upper traps. Positive median and ulnar ULTT. Relief with cervical distraction. Painful AROM with mild limitations most notable in rotation and extension.  Decreased sensation in LUE dermatomes C5 and C8.   OBJECTIVE IMPAIRMENTS: decreased ROM and pain.   ACTIVITY LIMITATIONS: carrying, bending, and sitting  PARTICIPATION LIMITATIONS: meal prep, cleaning, laundry, community activity, and occupation  PERSONAL FACTORS: Past/current experiences, Time since onset of injury/illness/exacerbation, and 1-2 comorbidities: hypothyroidism and anxiety, are also affecting patient's functional outcome.   REHAB POTENTIAL: Good  CLINICAL DECISION MAKING: Evolving/moderate complexity  EVALUATION COMPLEXITY: Moderate   GOALS: Goals reviewed with patient? Yes  SHORT TERM GOALS: Target date: 08/04/2023  Pt will be independent with HEP to improve strength and decrease neck pain to improve pain-free function at home and work. Baseline:  Goal status: INITIAL  2.  Pt will decrease worst neck pain by at least 2 points on the NPRS in order to demonstrate clinically significant reduction in neck pain. Baseline: 09/23/22: worst: 8/10; Goal status: INITIAL   LONG TERM GOALS: Target date: 09/01/2023  Pt will increase FOTO to at least 66 to demonstrate significant improvement in function at home and work related to neck pain  Baseline: 09/23/22: 63 Goal status: INITIAL  2.  Pt will decrease NDI score by at least 19% in order demonstrate clinically significant reduction in neck pain/disability.       Baseline: 09/23/22: To be completed at next visit Goal status: INITIAL  3.  Pt will report at least 75% improvement in her neck symptoms in order to complete all household responsibilities as well as try to play pickleball without significant increase in pain.  Baseline:  Goal status: INITIAL   PLAN: PT FREQUENCY: 2x/week  PT DURATION: 8 weeks  PLANNED INTERVENTIONS: Therapeutic exercises, Therapeutic activity, Neuromuscular re-education, Balance training, Gait training, Patient/Family education, Joint manipulation, Joint mobilization, Vestibular training,  Canalith repositioning, Dry Needling, Electrical stimulation, Spinal manipulation, Spinal mobilization, Cryotherapy, Moist heat, Taping, Traction, Ultrasound, Ionotophoresis 4mg /ml Dexamethasone, and Manual therapy  PLAN FOR NEXT SESSION: Assess PAIVM, test reflexes, consider  manual or mechanical traction, nerve glides, soft tissue mobilization, consider TDN, issue HEP;  Maureen Hoffman PT, DPT, GCS  Sharonann Malbrough 07/07/2023, 8:48 AM

## 2023-07-08 DIAGNOSIS — D2371 Other benign neoplasm of skin of right lower limb, including hip: Secondary | ICD-10-CM | POA: Diagnosis not present

## 2023-07-08 DIAGNOSIS — D235 Other benign neoplasm of skin of trunk: Secondary | ICD-10-CM | POA: Diagnosis not present

## 2023-07-08 DIAGNOSIS — L650 Telogen effluvium: Secondary | ICD-10-CM | POA: Diagnosis not present

## 2023-07-22 ENCOUNTER — Telehealth: Payer: Medicaid Other | Admitting: Physician Assistant

## 2023-07-22 DIAGNOSIS — J020 Streptococcal pharyngitis: Secondary | ICD-10-CM | POA: Diagnosis not present

## 2023-07-22 MED ORDER — AMOXICILLIN 500 MG PO CAPS: 500.0000 mg | ORAL_CAPSULE | Freq: Two times a day (BID) | ORAL | 0 refills | Status: AC

## 2023-07-22 NOTE — Progress Notes (Signed)

## 2023-07-28 ENCOUNTER — Other Ambulatory Visit: Payer: Self-pay | Admitting: Family Medicine

## 2023-07-28 DIAGNOSIS — I10 Essential (primary) hypertension: Secondary | ICD-10-CM

## 2023-08-01 NOTE — Telephone Encounter (Signed)
 Requested Prescriptions  Pending Prescriptions Disp Refills   hydrochlorothiazide  (HYDRODIURIL ) 25 MG tablet [Pharmacy Med Name: HYDROCHLOROTHIAZIDE  25 MG TAB] 90 tablet 0    Sig: TAKE 1 TABLET (25 MG TOTAL) BY MOUTH DAILY.     Cardiovascular: Diuretics - Thiazide Passed - 08/01/2023  2:06 PM      Passed - Cr in normal range and within 180 days    Creatinine  Date Value Ref Range Status  11/02/2013 0.59 (L) 0.60 - 1.30 mg/dL Final   Creatinine, Ser  Date Value Ref Range Status  05/17/2023 0.77 0.57 - 1.00 mg/dL Final         Passed - K in normal range and within 180 days    Potassium  Date Value Ref Range Status  05/17/2023 4.2 3.5 - 5.2 mmol/L Final  11/02/2013 4.2 3.5 - 5.1 mmol/L Final         Passed - Na in normal range and within 180 days    Sodium  Date Value Ref Range Status  05/17/2023 140 134 - 144 mmol/L Final  11/02/2013 137 136 - 145 mmol/L Final         Passed - Last BP in normal range    BP Readings from Last 1 Encounters:  05/17/23 120/74         Passed - Valid encounter within last 6 months    Recent Outpatient Visits           2 months ago Healthcare maintenance   [facility], Selinda PARAS, MD   3 months ago Depression with anxiety   [place] [facility], Selinda PARAS, MD   9 months ago Primary hypertension   [place] [facility], Selinda PARAS, MD   10 months ago Anxiety and depression   [facility], Selinda PARAS, MD   1 year ago Primary hypertension   [place] [facility] Melvin Pao, NP       Future Appointments             In 2 weeks Ku, Selinda PARAS, MD [facility] at [facility], [facility]

## 2023-08-17 ENCOUNTER — Ambulatory Visit: Payer: Medicaid Other | Admitting: Family Medicine

## 2023-08-22 ENCOUNTER — Ambulatory Visit
Admission: EM | Admit: 2023-08-22 | Discharge: 2023-08-22 | Disposition: A | Discharge status: Home / Self Care | Payer: Medicaid Other | Attending: Emergency Medicine | Admitting: Emergency Medicine

## 2023-08-22 ENCOUNTER — Ambulatory Visit (INDEPENDENT_AMBULATORY_CARE_PROVIDER_SITE_OTHER): Payer: Medicaid Other

## 2023-08-22 DIAGNOSIS — Z87442 Personal history of urinary calculi: Secondary | ICD-10-CM | POA: Insufficient documentation

## 2023-08-22 DIAGNOSIS — R109 Unspecified abdominal pain: Secondary | ICD-10-CM | POA: Diagnosis not present

## 2023-08-22 DIAGNOSIS — R300 Dysuria: Secondary | ICD-10-CM | POA: Diagnosis not present

## 2023-08-22 DIAGNOSIS — R310 Gross hematuria: Secondary | ICD-10-CM | POA: Insufficient documentation

## 2023-08-22 DIAGNOSIS — M545 Low back pain, unspecified: Secondary | ICD-10-CM | POA: Diagnosis not present

## 2023-08-22 DIAGNOSIS — R319 Hematuria, unspecified: Secondary | ICD-10-CM | POA: Diagnosis not present

## 2023-08-22 LAB — URINALYSIS, ROUTINE W REFLEX MICROSCOPIC
Bilirubin Urine: NEGATIVE
Glucose, UA: NEGATIVE mg/dL
Ketones, ur: NEGATIVE mg/dL
Leukocytes,Ua: NEGATIVE
Nitrite: NEGATIVE
Protein, ur: NEGATIVE mg/dL
Specific Gravity, Urine: 1.015 (ref 1.005–1.030)
pH: 7 (ref 5.0–8.0)

## 2023-08-22 LAB — URINALYSIS, MICROSCOPIC (REFLEX)

## 2023-08-22 LAB — PREGNANCY, URINE: Preg Test, Ur: NEGATIVE

## 2023-08-22 MED ORDER — CIPROFLOXACIN HCL 500 MG PO TABS: 500.0000 mg | ORAL_TABLET | Freq: Two times a day (BID) | ORAL | 0 refills | Status: AC

## 2023-08-22 MED ORDER — KETOROLAC TROMETHAMINE 10 MG PO TABS: 10.0000 mg | ORAL_TABLET | Freq: Four times a day (QID) | ORAL | 0 refills | Status: DC | PRN

## 2023-08-22 MED ORDER — KETOROLAC TROMETHAMINE 60 MG/2ML IM SOLN
30.0000 mg | Freq: Once | INTRAMUSCULAR | Status: AC
  Administered 2023-08-22: 30 mg via INTRAMUSCULAR

## 2023-08-22 NOTE — Discharge Instructions (Addendum)
UTI: Based on either symptoms or urinalysis, you may have a urinary tract infection. We will send the urine for culture and call with results in a few days. Begin antibiotics at this time. Your symptoms should be much improved over the next 2-3 days. Increase rest and fluid intake. If for some reason symptoms are worsening or not improving after a couple of days or the urine culture determines the antibiotics you are taking will not treat the infection, the antibiotics may be changed. Return or go to ER for fever, back pain, worsening urinary pain, discharge, increased blood in urine. May take Tylenol or Motrin OTC for pain relief or consider AZO if no contraindications   -I will call if the radiologist sees a kidney stone on your x-ray and send Flomax. - If you do not hear from me you do not have a kidney stone seen on x-ray but 1 is not ruled out.  If you continue to have symptoms after the antibiotics you need to see PCP or go to ER for CT scan to look for kidney stone. - If at any point your pain becomes severe or you develop fever you should go to the ER.

## 2023-08-22 NOTE — ED Triage Notes (Signed)
Pt present lower back pain with blood in urine. Pt states symptom started a week ago. Pt states believe she has passed a kidney stone. Pt states hurt to urinate.

## 2023-08-22 NOTE — ED Provider Notes (Signed)
[facility]    CSN: [id number] Arrival date & time: 08/22/23  1314      History   Chief Complaint Chief Complaint  Patient presents with   Back Pain    HPI Maureen Hoffman is a 38 y.o. female presenting for lower back pain, mostly of the right flank radiating to bilateral lower abdomen, mostly on the right side, dysuria, hematuria, fatigue, urinary frequency and urgency x 1 week.  Patient has history of kidney stones, UTIs and kidney infections and thinks she may have 1 of these conditions.  Denies associated fever, nausea/vomiting, diarrhea, constipation.  No report of concern of STI.  Has taken Azo but not recently.  Has also tried ibuprofen for her back.  No other complaints.  HPI  Past Medical History:  Diagnosis Date   Hashimoto's disease 2020   Is now on Synthroid although she still apparently has antibodies   Hypothyroidism (acquired)    Mood swings    Seizures (HCC)    as a child   Thrombocytopenia affecting pregnancy, antepartum (HCC) 12/03/2016   Thrombocytopenia complicating pregnancy (HCC)    2018    Patient Active Problem List   Diagnosis Date Noted   Skin lesion of back 05/17/2023   Healthcare maintenance 05/17/2023   Left cervical radiculopathy 05/08/2021   Hypertension 09/02/2020   Thyroid disease 09/02/2020   Depression with anxiety 11/13/2019    Past Surgical History:  Procedure Laterality Date   NO PAST SURGERIES      OB History     Gravida  3   Para  1   Term  1   Preterm      AB  1   Living  2      SAB  1   IAB      Ectopic      Multiple  0   Live Births  1            Home Medications    Prior to Admission medications   Medication Sig Start Date End Date Taking? Authorizing Provider  ciprofloxacin (CIPRO) 500 MG tablet Take 1 tablet (500 mg total) by mouth every 12 (twelve) hours for 7 days. 08/22/23 08/29/23 Yes Eusebio Friendly B, PA-C  ketorolac (TORADOL) 10 MG tablet Take 1 tablet (10 mg total) by  mouth every 6 (six) hours as needed. 08/22/23  Yes Shirlee Latch, PA-C  cariprazine (VRAYLAR) 1.5 MG capsule Take 1 capsule (1.5 mg total) by mouth daily. 04/26/23   Jerrol Banana, MD  fluticasone [facility]) 50 MCG/ACT nasal spray SPRAY 2 SPRAYS INTO EACH NOSTRIL EVERY DAY 11/04/22   Jerrol Banana, MD  gabapentin (NEURONTIN) 300 MG capsule TAKE 1 CAPSULE (300 MG TOTAL) BY MOUTH AT BEDTIME. START TAKING 1 CAPSULE AT BEDTIME, IF NEEDED AFTER 3 DAYS CAN INCREASE TO TWICE A DAY 07/12/23   Jerrol Banana, MD  hydrochlorothiazide (HYDRODIURIL) 25 MG tablet TAKE 1 TABLET (25 MG TOTAL) BY MOUTH DAILY. 08/01/23   Jerrol Banana, MD  levothyroxine (SYNTHROID) 50 MCG tablet Take 1 tablet (50 mcg total) by mouth daily. Patient taking differently: Take 75 mcg by mouth daily. 04/28/22   Larae Grooms, NP  norgestimate-ethinyl estradiol (ORTHO-CYCLEN) 0.25-35 MG-MCG tablet Take 1 tablet by mouth daily. 10/18/22   Jerrol Banana, MD  traZODone (DESYREL) 50 MG tablet TAKE 1/2 TO 1 TABLET BY MOUTH AT BEDTIME AS NEEDED FOR SLEEP 06/09/23   Jerrol Banana, MD  triamcinolone ointment (KENALOG) 0.1 %  Apply 1 Application topically 2 (two) times daily. Apply sparingly to affected areas for a maximum of 2 weeks 05/17/23   Jerrol Banana, MD    Family History Family History  Problem Relation Age of Onset   Healthy Mother    Colon cancer Father    Hashimoto's thyroiditis Sister    Colon cancer Paternal Uncle    Breast cancer Maternal Grandmother    Diabetes Maternal Grandmother    Breast cancer Paternal Grandmother    COPD Paternal Grandmother    Brain cancer Paternal Grandfather     Social History Social History   Tobacco Use   Smoking status: Former    Current packs/day: 0.00    Types: Cigarettes    Quit date: 07/13/2010    Years since quitting: 13.1   Smokeless tobacco: Never  Vaping Use   Vaping status: Never Used  Substance Use Topics   Alcohol use: Yes    Comment: rare   Drug  use: No     Allergies   Patient has no known allergies.   Review of Systems Review of Systems  Constitutional:  Negative for chills, fatigue and fever.  Gastrointestinal:  Positive for abdominal pain. Negative for anal bleeding, blood in stool, constipation, diarrhea, nausea, rectal pain and vomiting.  Genitourinary:  Positive for dysuria, flank pain, frequency, hematuria and urgency. Negative for decreased urine volume, pelvic pain, vaginal bleeding, vaginal discharge and vaginal pain.  Musculoskeletal:  Negative for back pain.  Skin:  Negative for rash.     Physical Exam Triage Vital Signs ED Triage Vitals  Encounter Vitals Group     BP 08/22/23 1418 111/83     Systolic BP Percentile --      Diastolic BP Percentile --      Pulse Rate 08/22/23 1418 68     Resp 08/22/23 1418 16     Temp 08/22/23 1418 98.5 F (36.9 C)     Temp Source 08/22/23 1418 Oral     SpO2 08/22/23 1418 100 %     Weight --      Height --      Head Circumference --      Peak Flow --      Pain Score 08/22/23 1416 5     Pain Loc --      Pain Education --      Exclude from Growth Chart --    No data found.  Updated Vital Signs BP 111/83 (BP Location: Left Arm)   Pulse 68   Temp 98.5 F (36.9 C) (Oral)   Resp 16   LMP 07/26/2023   SpO2 100%    Physical Exam Vitals and nursing note reviewed.  Constitutional:      General: She is not in acute distress.    Appearance: Normal appearance. She is not ill-appearing or toxic-appearing.  HENT:     Head: Normocephalic and atraumatic.  Eyes:     General: No scleral icterus.       Right eye: No discharge.        Left eye: No discharge.     Conjunctiva/sclera: Conjunctivae normal.  Cardiovascular:     Rate and Rhythm: Normal rate and regular rhythm.     Heart sounds: Normal heart sounds.  Pulmonary:     Effort: Pulmonary effort is normal. No respiratory distress.     Breath sounds: Normal breath sounds.  Abdominal:     Palpations: Abdomen is  soft.     Tenderness: There is abdominal  tenderness (generalized lower abdomen). There is right CVA tenderness. There is no left CVA tenderness.  Musculoskeletal:     Cervical back: Neck supple.  Skin:    General: Skin is dry.  Neurological:     General: No focal deficit present.     Mental Status: She is alert. Mental status is at baseline.     Motor: No weakness.     Gait: Gait normal.  Psychiatric:        Mood and Affect: Mood normal.        Behavior: Behavior normal.      UC Treatments / Results  Labs (all labs ordered are listed, but only abnormal results are displayed) Labs Reviewed  URINALYSIS, ROUTINE W REFLEX MICROSCOPIC - Abnormal; Notable for the following components:      Result Value   Color, Urine STRAW (*)    Hgb urine dipstick SMALL (*)    All other components within normal limits  URINALYSIS, MICROSCOPIC (REFLEX) - Abnormal; Notable for the following components:   Bacteria, UA MANY (*)    All other components within normal limits  URINE CULTURE  PREGNANCY, URINE    EKG   Radiology DG Abdomen 1 View Result Date: 08/22/2023 CLINICAL DATA:  Low back pain and hematuria. EXAM: ABDOMEN - 1 VIEW COMPARISON:  None Available. FINDINGS: Mild colonic stool burden. No bowel dilatation or evidence of obstruction. No free air or radiopaque calculi. The osseous structures are intact. The soft tissues are unremarkable. IMPRESSION: Negative. Electronically Signed   By: Elgie Collard M.D.   On: 08/22/2023 15:16    Procedures Procedures (including critical care time)  Medications Ordered in UC Medications  ketorolac (TORADOL) injection 30 mg (30 mg Intramuscular Given 08/22/23 1513)    Initial Impression / Assessment and Plan / UC Course  I have reviewed the triage vital signs and the nursing notes.  Pertinent labs & imaging results that were available during my care of the patient were reviewed by me and considered in my medical decision making (see chart for  details).   38 year old female with history of UTIs, pyelonephritis, kidney stones presents for 1 week history of dysuria, flank pain/lower back pain, lower abdominal pain, frequency and urgency as well as hematuria.  No associated fever.  Vitals are all normal and stable and she is overall well-appearing.  No acute distress.  On exam chest clear to auscultation heart regular rate and rhythm.  Abdomen soft with generalized lower abdominal tenderness.  Right-sided CVA tenderness.  Urinalysis performed today shows straw-colored urine with hemoglobin and many bacteria.  Will send urine for culture. Urine pregnancy negative.  KUB performed to assess for possible kidney stone.  KUB negative.  Suspect urinary tract infection/acute pyelonephritis given right flank pain.  Will treat at this time with Cipro.  Encouraged increasing rest and fluids.  She was also given 30 mg IM ketorolac in clinic for acute pain relief and I sent Toradol to pharmacy.  Reviewed that if she is not improving with use of this antibiotic or symptoms worsen she may need to be seen by PCP or at the ER for CT scan to look further for kidney stone.  Patient is understanding and agreeable.  Thoroughly reviewed return and ER precautions.   Final Clinical Impressions(s) / UC Diagnoses   Final diagnoses:  Gross hematuria  Right flank pain  Dysuria  History of kidney stones     Discharge Instructions      UTI: Based on either symptoms or  urinalysis, you may have a urinary tract infection. We will send the urine for culture and call with results in a few days. Begin antibiotics at this time. Your symptoms should be much improved over the next 2-3 days. Increase rest and fluid intake. If for some reason symptoms are worsening or not improving after a couple of days or the urine culture determines the antibiotics you are taking will not treat the infection, the antibiotics may be changed. Return or go to ER for fever, back pain,  worsening urinary pain, discharge, increased blood in urine. May take Tylenol or Motrin OTC for pain relief or consider AZO if no contraindications   -I will call if the radiologist sees a kidney stone on your x-ray and send Flomax. - If you do not hear from me you do not have a kidney stone seen on x-ray but 1 is not ruled out.  If you continue to have symptoms after the antibiotics you need to see PCP or go to ER for CT scan to look for kidney stone. - If at any point your pain becomes severe or you develop fever you should go to the ER.     ED Prescriptions     Medication Sig Dispense Auth. Provider   ciprofloxacin (CIPRO) 500 MG tablet Take 1 tablet (500 mg total) by mouth every 12 (twelve) hours for 7 days. 14 tablet Eusebio Friendly B, PA-C   ketorolac (TORADOL) 10 MG tablet Take 1 tablet (10 mg total) by mouth every 6 (six) hours as needed. 20 tablet Gareth Morgan      PDMP not reviewed this encounter.   Shirlee Latch, PA-C 08/22/23 1537

## 2023-08-25 LAB — URINE CULTURE: Culture: 20000 — AB

## 2023-08-26 DIAGNOSIS — F331 Major depressive disorder, recurrent, moderate: Secondary | ICD-10-CM | POA: Diagnosis not present

## 2023-08-29 DIAGNOSIS — R21 Rash and other nonspecific skin eruption: Secondary | ICD-10-CM | POA: Diagnosis not present

## 2023-09-22 ENCOUNTER — Other Ambulatory Visit: Payer: Self-pay | Admitting: Family Medicine

## 2023-09-23 NOTE — Telephone Encounter (Signed)
 Requested Prescriptions  Pending Prescriptions Disp Refills   norgestimate-ethinyl estradiol (ORTHO-CYCLEN) 0.25-35 MG-MCG tablet [Pharmacy Med Name: Alveria Apley 0.25-0.035 MG] 84 tablet 2    Sig: TAKE 1 TABLET BY MOUTH EVERY DAY     OB/GYN:  Contraceptives Passed - 09/23/2023  4:27 PM      Passed - Last BP in normal range    BP Readings from Last 1 Encounters:  08/22/23 111/83         Passed - Valid encounter within last 12 months    Recent Outpatient Visits           4 months ago Healthcare maintenance   [facility], Ocie Bob, MD   5 months ago Depression with anxiety   [facility], Ocie Bob, MD   11 months ago Primary hypertension   [place] [facility], Ocie Bob, MD   1 year ago Anxiety and depression   [place] [facility], Ocie Bob, MD   1 year ago Primary hypertension   [facility] Larae Grooms, NP              Passed - Patient is not a smoker

## 2023-09-28 DIAGNOSIS — E063 Autoimmune thyroiditis: Secondary | ICD-10-CM | POA: Diagnosis not present

## 2023-10-04 ENCOUNTER — Other Ambulatory Visit: Payer: Self-pay | Admitting: Family Medicine

## 2023-10-05 ENCOUNTER — Other Ambulatory Visit: Payer: Self-pay | Admitting: Family Medicine

## 2023-10-05 ENCOUNTER — Encounter: Payer: Self-pay | Admitting: Family Medicine

## 2023-10-05 NOTE — Telephone Encounter (Signed)
 Requested Prescriptions  Pending Prescriptions Disp Refills   traZODone (DESYREL) 50 MG tablet [Pharmacy Med Name: TRAZODONE 50 MG TABLET] 90 tablet 0    Sig: TAKE 1/2 TO 1 TABLET BY MOUTH AT BEDTIME AS NEEDED FOR SLEEP     Psychiatry: Antidepressants - Serotonin Modulator Passed - 10/05/2023 12:32 PM      Passed - Completed PHQ-2 or PHQ-9 in the last 360 days      Passed - Valid encounter within last 6 months    Recent Outpatient Visits           4 months ago Healthcare maintenance   [facility], Ocie Bob, MD   5 months ago Depression with anxiety   [facility], Ocie Bob, MD   11 months ago Primary hypertension   [place] [facility], Ocie Bob, MD   1 year ago Anxiety and depression   [facility], Ocie Bob, MD   1 year ago Primary hypertension   [facility] Larae Grooms, NP

## 2024-01-01 ENCOUNTER — Other Ambulatory Visit: Payer: Self-pay | Admitting: Family Medicine

## 2024-01-02 NOTE — Telephone Encounter (Signed)
 Courtesy refill. Patient will need an office visit for additional refills.  Requested Prescriptions  Pending Prescriptions Disp Refills   traZODone  (DESYREL ) 50 MG tablet [Pharmacy Med Name: TRAZODONE  50 MG TABLET] 30 tablet 0    Sig: TAKE 1/2 TO 1 TABLET BY MOUTH AT BEDTIME AS NEEDED FOR SLEEP     Psychiatry: Antidepressants - Serotonin Modulator Failed - 01/02/2024  5:31 PM      Failed - Valid encounter within last 6 months    Recent Outpatient Visits   None            Passed - Completed PHQ-2 or PHQ-9 in the last 360 days

## 2024-01-12 ENCOUNTER — Other Ambulatory Visit: Payer: Self-pay | Admitting: Family Medicine

## 2024-01-12 DIAGNOSIS — I10 Essential (primary) hypertension: Secondary | ICD-10-CM

## 2024-01-13 NOTE — Telephone Encounter (Signed)
 Courtesy refill. Patient will need an office visit for additional refills.  Requested Prescriptions  Pending Prescriptions Disp Refills   hydrochlorothiazide  (HYDRODIURIL ) 25 MG tablet [Pharmacy Med Name: HYDROCHLOROTHIAZIDE  25 MG TAB] 30 tablet 0    Sig: TAKE 1 TABLET (25 MG TOTAL) BY MOUTH DAILY.     Cardiovascular: Diuretics - Thiazide Failed - 01/13/2024  3:32 PM      Failed - Cr in normal range and within 180 days    Creatinine  Date Value Ref Range Status  11/02/2013 0.59 (L) 0.60 - 1.30 mg/dL Final   Creatinine, Ser  Date Value Ref Range Status  05/17/2023 0.77 0.57 - 1.00 mg/dL Final         Failed - K in normal range and within 180 days    Potassium  Date Value Ref Range Status  05/17/2023 4.2 3.5 - 5.2 mmol/L Final  11/02/2013 4.2 3.5 - 5.1 mmol/L Final         Failed - Na in normal range and within 180 days    Sodium  Date Value Ref Range Status  05/17/2023 140 134 - 144 mmol/L Final  11/02/2013 137 136 - 145 mmol/L Final         Failed - Valid encounter within last 6 months    Recent Outpatient Visits   None            Passed - Last BP in normal range    BP Readings from Last 1 Encounters:  08/22/23 111/83

## 2024-01-14 ENCOUNTER — Ambulatory Visit
Admission: EM | Admit: 2024-01-14 | Discharge: 2024-01-14 | Disposition: A | Discharge status: Home / Self Care | Attending: Physician Assistant | Admitting: Physician Assistant

## 2024-01-14 ENCOUNTER — Encounter: Payer: Self-pay | Admitting: Emergency Medicine

## 2024-01-14 DIAGNOSIS — N898 Other specified noninflammatory disorders of vagina: Secondary | ICD-10-CM

## 2024-01-14 DIAGNOSIS — Z113 Encounter for screening for infections with a predominantly sexual mode of transmission: Secondary | ICD-10-CM | POA: Insufficient documentation

## 2024-01-14 DIAGNOSIS — N760 Acute vaginitis: Secondary | ICD-10-CM | POA: Diagnosis not present

## 2024-01-14 MED ORDER — CLOTRIMAZOLE 1 % EX CREA: TOPICAL_CREAM | CUTANEOUS | 0 refills | Status: DC

## 2024-01-14 MED ORDER — FLUCONAZOLE 150 MG PO TABS: ORAL_TABLET | ORAL | 0 refills | Status: DC

## 2024-01-14 MED ORDER — METRONIDAZOLE 500 MG PO TABS: 500.0000 mg | ORAL_TABLET | Freq: Two times a day (BID) | ORAL | 0 refills | Status: AC

## 2024-01-14 MED ORDER — METRONIDAZOLE 500 MG PO TABS: 500.0000 mg | ORAL_TABLET | Freq: Two times a day (BID) | ORAL | 0 refills | Status: DC

## 2024-01-14 NOTE — ED Provider Notes (Signed)
 [facility]    CSN: [id number] Arrival date & time: 01/14/24  1309      History   Chief Complaint Chief Complaint  Patient presents with   Vaginal Discharge    HPI Maureen Hoffman is a 38 y.o. female presenting for approximately 1 week history of vaginal discharge with a fishy odor, itching, irritation, erythema and internal vaginal pain.  Patient reports vaginal discharge that is brownish but also reports recently coming off her menstrual period.  She denies dysuria, frequency, urgency, flank pain, fever.  Would like STI screening.  Denies any known exposure to STIs and partner does not have any symptoms.  History of BV infections and thinks this could be similar.  HPI  Past Medical History:  Diagnosis Date   Hashimoto's disease 2020   Is now on Synthroid  although she still apparently has antibodies   Hypothyroidism (acquired)    Mood swings    Seizures (HCC)    as a child   Thrombocytopenia affecting pregnancy, antepartum (HCC) 12/03/2016   Thrombocytopenia complicating pregnancy (HCC)    2018    Patient Active Problem List   Diagnosis Date Noted   Skin lesion of back 05/17/2023   Healthcare maintenance 05/17/2023   Left cervical radiculopathy 05/08/2021   Hypertension 09/02/2020   Thyroid  disease 09/02/2020   Depression with anxiety 11/13/2019    Past Surgical History:  Procedure Laterality Date   NO PAST SURGERIES      OB History     Gravida  3   Para  1   Term  1   Preterm      AB  1   Living  2      SAB  1   IAB      Ectopic      Multiple  0   Live Births  1            Home Medications    Prior to Admission medications   Medication Sig Start Date End Date Taking? Authorizing Provider  hydrochlorothiazide  (HYDRODIURIL ) 25 MG tablet TAKE 1 TABLET (25 MG TOTAL) BY MOUTH DAILY. 01/13/24  Yes Alvia Selinda PARAS, MD  levothyroxine  (SYNTHROID ) 50 MCG tablet Take 1 tablet (50 mcg total) by mouth daily. Patient taking  differently: Take 75 mcg by mouth daily. 04/28/22  Yes Melvin Pao, NP  norgestimate -ethinyl estradiol  (ORTHO-CYCLEN) 0.25-35 MG-MCG tablet TAKE 1 TABLET BY MOUTH EVERY DAY 09/23/23  Yes Matthews, Jason J, MD  sertraline  (ZOLOFT ) 50 MG tablet Take 50 mg by mouth every morning. 12/05/23  Yes [provider]  cariprazine  (VRAYLAR ) 1.5 MG capsule Take 1 capsule (1.5 mg total) by mouth daily. 04/26/23   Matthews, Jason J, MD  clotrimazole (LOTRIMIN) 1 % cream Apply to affected area 2 times daily 01/14/24   Arvis Huxley B, PA-C  fluconazole  (DIFLUCAN ) 150 MG tablet Take 1 tab po q72 h prn yeast infection 01/14/24   Arvis Huxley B, PA-C  fluticasone  (FLONASE ) 50 MCG/ACT nasal spray SPRAY 2 SPRAYS INTO EACH NOSTRIL EVERY DAY 11/04/22   Matthews, Jason J, MD  gabapentin  (NEURONTIN ) 300 MG capsule TAKE 1 CAPSULE (300 MG TOTAL) BY MOUTH AT BEDTIME. START TAKING 1 CAPSULE AT BEDTIME, IF NEEDED AFTER 3 DAYS CAN INCREASE TO TWICE A DAY 07/12/23   Alvia Selinda PARAS, MD  ketorolac  (TORADOL ) 10 MG tablet Take 1 tablet (10 mg total) by mouth every 6 (six) hours as needed. 08/22/23   Arvis Huxley NOVAK, PA-C  lamoTRIgine (LAMICTAL) 25 MG  tablet Take by mouth.    [provider]  metroNIDAZOLE  (FLAGYL ) 500 MG tablet Take 1 tablet (500 mg total) by mouth 2 (two) times daily for 7 days. 01/14/24 01/21/24  Arvis Huxley B, PA-C  minoxidil (LONITEN) 2.5 MG tablet Take by mouth.    [provider]  traZODone  (DESYREL ) 50 MG tablet TAKE 1/2 TO 1 TABLET BY MOUTH AT BEDTIME AS NEEDED FOR SLEEP 01/02/24   Matthews, Jason J, MD  triamcinolone  ointment (KENALOG ) 0.1 % Apply 1 Application topically 2 (two) times daily. Apply sparingly to affected areas for a maximum of 2 weeks 05/17/23   Alvia Selinda PARAS, MD    Family History Family History  Problem Relation Age of Onset   Healthy Mother    Colon cancer Father    Hashimoto's thyroiditis Sister    Colon cancer Paternal Uncle    Breast cancer Maternal  Grandmother    Diabetes Maternal Grandmother    Breast cancer Paternal Grandmother    COPD Paternal Grandmother    Brain cancer Paternal Grandfather     Social History Social History   Tobacco Use   Smoking status: Former    Current packs/day: 0.00    Types: Cigarettes    Quit date: 07/13/2010    Years since quitting: 13.5   Smokeless tobacco: Never  Vaping Use   Vaping status: Never Used  Substance Use Topics   Alcohol use: Yes    Comment: rare   Drug use: No     Allergies   Patient has no known allergies.   Review of Systems Review of Systems  Constitutional:  Negative for fatigue and fever.  Gastrointestinal:  Negative for abdominal pain.  Genitourinary:  Positive for vaginal discharge and vaginal pain. Negative for difficulty urinating, dysuria, flank pain, frequency, hematuria, urgency and vaginal bleeding.  Musculoskeletal:  Negative for back pain.  Skin:  Negative for rash.     Physical Exam Triage Vital Signs ED Triage Vitals  Encounter Vitals Group     BP --      Girls Systolic BP Percentile --      Girls Diastolic BP Percentile --      Boys Systolic BP Percentile --      Boys Diastolic BP Percentile --      Pulse --      Resp --      Temp --      Temp src --      SpO2 --      Weight 01/14/24 1322 154 lb 1.6 oz (69.9 kg)     Height 01/14/24 1322 5' 3 (1.6 m)     Head Circumference --      Peak Flow --      Pain Score 01/14/24 1321 3     Pain Loc --      Pain Education --      Exclude from Growth Chart --    No data found.  Updated Vital Signs BP (!) 152/96 (BP Location: Right Arm)   Pulse 75   Temp 99.2 F (37.3 C) (Oral)   Resp 16   Ht 5' 3 (1.6 m)   Wt 154 lb 1.6 oz (69.9 kg)   LMP 01/02/2024 (Exact Date)   SpO2 98%   BMI 27.30 kg/m      Physical Exam Vitals and nursing note reviewed.  Constitutional:      General: She is not in acute distress.    Appearance: Normal appearance. She is not ill-appearing or  toxic-appearing.  HENT:     Head: Normocephalic and atraumatic.   Eyes:     General: No scleral icterus.       Right eye: No discharge.        Left eye: No discharge.     Conjunctiva/sclera: Conjunctivae normal.    Cardiovascular:     Rate and Rhythm: Normal rate and regular rhythm.     Heart sounds: Normal heart sounds.  Pulmonary:     Effort: Pulmonary effort is normal. No respiratory distress.     Breath sounds: Normal breath sounds.  Abdominal:     Palpations: Abdomen is soft.     Tenderness: There is no abdominal tenderness. There is no right CVA tenderness or left CVA tenderness.   Musculoskeletal:     Cervical back: Neck supple.   Skin:    General: Skin is dry.   Neurological:     General: No focal deficit present.     Mental Status: She is alert. Mental status is at baseline.     Motor: No weakness.     Gait: Gait normal.   Psychiatric:        Mood and Affect: Mood normal.        Behavior: Behavior normal.      UC Treatments / Results  Labs (all labs ordered are listed, but only abnormal results are displayed) Labs Reviewed  CERVICOVAGINAL ANCILLARY ONLY    EKG   Radiology No results found.  Procedures Procedures (including critical care time)  Medications Ordered in UC Medications - No data to display  Initial Impression / Assessment and Plan / UC Course  I have reviewed the triage vital signs and the nursing notes.  Pertinent labs & imaging results that were available during my care of the patient were reviewed by me and considered in my medical decision making (see chart for details).   38 year old female presents for foul-smelling vaginal discharge, irritation and itching for the past several days.  Also request STI screening.  Patient elects to forego pelvic exam and perform vaginal self swab.  GC/chlamydia/trichomonas/BV and yeast testing obtained.  Presentation most consistent with acute vaginitis.  Treating at this time with  metronidazole , Diflucan  and topical clotrimazole.  Will amend treatment based on swab results if necessary.   Final Clinical Impressions(s) / UC Diagnoses   Final diagnoses:  Acute vaginitis  Vaginal discharge  Routine screening for STI (sexually transmitted infection)     Discharge Instructions      The most common types of vaginal infections are yeast infections and bacterial vaginosis. Neither of which are really considered to be sexually transmitted. Often a pH swab or wet prep is performed and if abnormal may reveal either type of infection. Begin metronidazole  if prescribed for possible BV infection. If there is concern for yeast infection, fluconazole  is often prescribed . Take this as directed. You may also apply topical miconazole (can be purchased OTC) externally for relief of itching. Increase rest and fluid intake. If labs sent out, we will call within 2-5 days with results and amend treatment if necessary. Always try to use pH balanced washes/wipes, urinate after intercourse, stay hydrated, and take probiotics if you are prone to vaginal infections. Return or see PCP or gynecologist for new/worsening infections.       ED Prescriptions     Medication Sig Dispense Auth. Provider   metroNIDAZOLE  (FLAGYL ) 500 MG tablet  (Status: Discontinued) Take 1 tablet (500 mg total) by mouth 2 (two) times daily  for 7 days. 14 tablet Arvis Huxley B, PA-C   fluconazole  (DIFLUCAN ) 150 MG tablet  (Status: Discontinued) Take 1 tab po q72 h prn yeast infection 2 tablet Arvis Huxley B, PA-C   clotrimazole (LOTRIMIN) 1 % cream  (Status: Discontinued) Apply to affected area 2 times daily 28 g Alexica Schlossberg B, PA-C   clotrimazole (LOTRIMIN) 1 % cream Apply to affected area 2 times daily 28 g Gianne Shugars B, PA-C   fluconazole  (DIFLUCAN ) 150 MG tablet Take 1 tab po q72 h prn yeast infection 2 tablet Arvis Huxley B, PA-C   metroNIDAZOLE  (FLAGYL ) 500 MG tablet Take 1 tablet (500 mg total) by mouth 2  (two) times daily for 7 days. 14 tablet Mialee Weyman B, PA-C      PDMP not reviewed this encounter.   Arvis Huxley NOVAK, PA-C 01/14/24 1358

## 2024-01-14 NOTE — ED Triage Notes (Signed)
 Pt c/o brown vaginal discharge, vaginal discomfort. Started about a week ago. She states she noticed when she was on her period that it was painful to remove tampons, and she has some vaginal irritation.

## 2024-01-14 NOTE — Discharge Instructions (Signed)

## 2024-01-16 LAB — CERVICOVAGINAL ANCILLARY ONLY
Bacterial Vaginitis (gardnerella): POSITIVE — AB
Candida Glabrata: NEGATIVE
Candida Vaginitis: POSITIVE — AB
Chlamydia: NEGATIVE
Comment: NEGATIVE
Comment: NEGATIVE
Comment: NEGATIVE
Comment: NEGATIVE
Comment: NEGATIVE
Comment: NORMAL
Neisseria Gonorrhea: NEGATIVE
Trichomonas: NEGATIVE

## 2024-01-17 ENCOUNTER — Ambulatory Visit (HOSPITAL_COMMUNITY): Payer: Self-pay

## 2024-02-02 ENCOUNTER — Encounter: Payer: Self-pay | Admitting: Family Medicine

## 2024-02-02 ENCOUNTER — Ambulatory Visit: Admitting: Family Medicine

## 2024-02-02 VITALS — BP 98/70 | HR 81 | Ht 63.0 in | Wt 160.6 lb

## 2024-02-02 DIAGNOSIS — F418 Other specified anxiety disorders: Secondary | ICD-10-CM

## 2024-02-02 DIAGNOSIS — Z6828 Body mass index (BMI) 28.0-28.9, adult: Secondary | ICD-10-CM | POA: Diagnosis not present

## 2024-02-02 DIAGNOSIS — E663 Overweight: Secondary | ICD-10-CM

## 2024-02-02 DIAGNOSIS — E079 Disorder of thyroid, unspecified: Secondary | ICD-10-CM | POA: Diagnosis not present

## 2024-02-02 DIAGNOSIS — E785 Hyperlipidemia, unspecified: Secondary | ICD-10-CM | POA: Diagnosis not present

## 2024-02-02 DIAGNOSIS — D649 Anemia, unspecified: Secondary | ICD-10-CM | POA: Insufficient documentation

## 2024-02-02 DIAGNOSIS — R5382 Chronic fatigue, unspecified: Secondary | ICD-10-CM | POA: Insufficient documentation

## 2024-02-02 NOTE — Assessment & Plan Note (Signed)
 Psychiatric medication changes - Previously on Vraylar , discontinued per psychiatrist's instructions - Started lamotrigine 25 mg, two tablets daily, in March after psychiatric consultation - Also taking Zoloft  50 mg daily - Medications effective for anxiety  Depression Managed with lamotrigine and Zoloft .  Anxiety symptoms well-managed, depressive symptoms include increased appetite, sleep disturbance, and fatigue. - Discuss symptoms with psychiatrist, including increased appetite, sleep disturbance, and fatigue. - Consider adjustment of psychiatric medications at their discretion if needed.

## 2024-02-02 NOTE — Assessment & Plan Note (Signed)
 Weight gain and increased appetite - Gained approximately 20 pounds since the beginning of the year - Associates weight gain with increased fatigue and sensation similar to prior hypertension - Appetite significantly increased, describing herself as 'ravenous' - Attributes increased appetite to recent medication changes  Overweight Weight gain of 20 pounds, BMI 28.45. Weight gain may contribute to fatigue and joint pain. Discussed weight management options, including medications. - Discuss increased appetite with psychiatrist. - Recommend sleep study to evaluate for sleep apnea.

## 2024-02-02 NOTE — Assessment & Plan Note (Addendum)
 Fatigue and sleep disturbance - Experiences extreme tiredness despite adequate sleep - Fatigue persists even after sleeping up to 12 hours - Takes trazodone  nightly to aid sleep - Wakes up in the middle of the night and has difficulty returning to sleep  Physical Exam VITALS: BP- 98/70 MEASUREMENTS: Weight- 160 lbs, BMI- 28.0. HEENT: No conjunctival pallor. CHEST: Lungs clear to auscultation bilaterally. CARDIOVASCULAR: Regular rate and rhythm, normal heart sounds. EXTREMITIES: No clubbing, cyanosis, or edema.  2+ pulses, capillary refill less than 3 seconds.  Chronic Fatigue Patient reports persistent fatigue possibly multifactorial in origin. Contributing factors may include iron deficiency anemia, medication side effects, mild hypotension, weight gain, and possible sleep disruption. - Order iron studies and CBC to evaluate for iron deficiency anemia - Monitor blood pressure at home; consider adjusting trazodone  if associated with hypotension - Discuss increased appetite and fatigue with psychiatrist to assess for medication-related effects - Coordinate sleep medicine referral for potential sleep study to rule out obstructive sleep apnea - Reassess after labs and psychiatric review to determine need for hematology or other specialty referral

## 2024-02-02 NOTE — Patient Instructions (Signed)
 Patient Action Plan  1. Iron Deficiency Anemia:    - Complete blood tests: CBC, Iron, TIBC, and Ferritin Panel.    - Start iron supplements if iron levels are low.    - Discuss menstrual cycle with your GYN.    - We will consider hematology referral for iron infusion if iron levels are significantly low.  2. Chronic Fatigue:    - Monitor blood pressure at home.    - Discuss fatigue and increased appetite with your psychiatrist.    - Consider adjusting trazodone  if it affects blood pressure.    - Coordinate a sleep study referral to check for sleep apnea.    - Reassess after lab results and psychiatric review.  3. Depression and Anxiety:    - Discuss symptoms of increased appetite, sleep disturbance, and fatigue with your psychiatrist.    - Consider medication adjustments if needed.  4. Overweight and Weight Management:    - Discuss increased appetite with your psychiatrist.    - Consider weight management options, including medications.    - Proceed with a sleep study to evaluate for sleep apnea.  5. Thyroid  and Hyperlipidemia:    - Complete TSH and lipid panel tests as ordered.  Red Flags: - If you experience worsening symptoms or new issues, contact the clinic for further evaluation.

## 2024-02-02 NOTE — Progress Notes (Signed)
 Primary Care / Sports Medicine Office Visit  Patient Information:  Patient ID: Maureen Hoffman, female DOB: [birth year] Age: 38 y.o. MRN: [id number]   Maureen Hoffman is a pleasant 38 y.o. female presenting with the following:  Chief Complaint  Patient presents with   Hypertension    Patient  presents today for HTN. She has been taking her medications as directed. She states sometimes her b/p is normal and some days it is high.    Weight Management Screening    Patient would like to discuss weight management treatment options.     Vitals:   02/02/24 1315  BP: 98/70  Pulse: 81  SpO2: 98%   Vitals:   02/02/24 1315  Weight: 160 lb 9.6 oz (72.8 kg)  Height: 5' 3 (1.6 m)   Body mass index is 28.45 kg/m.  No results found.   Independent interpretation of notes and tests performed by another provider:   None  Procedures performed:   None  Pertinent History, Exam, Impression, and Recommendations:   Problem List Items Addressed This Visit     Anemia   Iron deficiency and menstrual history - History of low iron levels noted in October - Not currently taking iron supplements - Menstrual cycle regular but heavier since last childbirth six years ago - Currently on birth control  Iron deficiency anemia Iron deficiency anemia contributing to fatigue and possible syncope. No severe clinical signs observed. - Order iron studies and complete blood count. - Initiate iron supplementation if levels are low. - Consider hematology referral for iron infusion if levels are significantly low. - Touch base with your GYN about menstrual cycle.      Relevant Orders   CBC   Iron, TIBC and Ferritin Panel   B12 and Folate Panel   Chronic fatigue - Primary   Fatigue and sleep disturbance - Experiences extreme tiredness despite adequate sleep - Fatigue persists even after sleeping up to 12 hours - Takes trazodone  nightly to aid sleep - Wakes up in the middle of the night and  has difficulty returning to sleep  Physical Exam VITALS: BP- 98/70 MEASUREMENTS: Weight- 160 lbs, BMI- 28.0. HEENT: No conjunctival pallor. CHEST: Lungs clear to auscultation bilaterally. CARDIOVASCULAR: Regular rate and rhythm, normal heart sounds. EXTREMITIES: No clubbing, cyanosis, or edema.  2+ pulses, capillary refill less than 3 seconds.  Chronic Fatigue Patient reports persistent fatigue possibly multifactorial in origin. Contributing factors may include iron deficiency anemia, medication side effects, mild hypotension, weight gain, and possible sleep disruption. - Order iron studies and CBC to evaluate for iron deficiency anemia - Monitor blood pressure at home; consider adjusting trazodone  if associated with hypotension - Discuss increased appetite and fatigue with psychiatrist to assess for medication-related effects - Coordinate sleep medicine referral for potential sleep study to rule out obstructive sleep apnea - Reassess after labs and psychiatric review to determine need for hematology or other specialty referral       Relevant Orders   Lipid panel   CBC   Comprehensive metabolic panel with GFR   TSH   Iron, TIBC and Ferritin Panel   B12 and Folate Panel   Ambulatory referral to Pulmonology   Depression with anxiety   Psychiatric medication changes - Previously on Vraylar , discontinued per psychiatrist's instructions - Started lamotrigine 25 mg, two tablets daily, in March after psychiatric consultation - Also taking Zoloft  50 mg daily - Medications effective for anxiety  Depression Managed with lamotrigine and Zoloft .  Anxiety  symptoms well-managed, depressive symptoms include increased appetite, sleep disturbance, and fatigue. - Discuss symptoms with psychiatrist, including increased appetite, sleep disturbance, and fatigue. - Consider adjustment of psychiatric medications at their discretion if needed.      Overweight with body mass index (BMI) of 28 to 28.9  in adult   Weight gain and increased appetite - Gained approximately 20 pounds since the beginning of the year - Associates weight gain with increased fatigue and sensation similar to prior hypertension - Appetite significantly increased, describing herself as 'ravenous' - Attributes increased appetite to recent medication changes  Overweight Weight gain of 20 pounds, BMI 28.45. Weight gain may contribute to fatigue and joint pain. Discussed weight management options, including medications. - Discuss increased appetite with psychiatrist. - Recommend sleep study to evaluate for sleep apnea.      Thyroid  disease (Chronic)   Relevant Orders   TSH   Other Visit Diagnoses       Hyperlipidemia, unspecified hyperlipidemia type       Relevant Orders   Lipid panel   Comprehensive metabolic panel with GFR      I provided a total time of 40 minutes including both face-to-face and non-face-to-face time on 02/02/2024 inclusive of time utilized for medical chart review, information gathering, care coordination with staff, and documentation completion.   Orders & Medications Medications: No orders of the defined types were placed in this encounter.  Orders Placed This Encounter  Procedures   Lipid panel   CBC   Comprehensive metabolic panel with GFR   TSH   Iron, TIBC and Ferritin Panel   B12 and Folate Panel   Ambulatory referral to Pulmonology     No follow-ups on file.     Selinda JINNY Ku, MD, [facility]   Primary Care Sports Medicine Primary Care and [facility] Medicine at [facility]

## 2024-02-02 NOTE — Assessment & Plan Note (Signed)
 Iron deficiency and menstrual history - History of low iron levels noted in October - Not currently taking iron supplements - Menstrual cycle regular but heavier since last childbirth six years ago - Currently on birth control  Iron deficiency anemia Iron deficiency anemia contributing to fatigue and possible syncope. No severe clinical signs observed. - Order iron studies and complete blood count. - Initiate iron supplementation if levels are low. - Consider hematology referral for iron infusion if levels are significantly low. - Touch base with your GYN about menstrual cycle.

## 2024-02-03 ENCOUNTER — Telehealth: Payer: Self-pay | Admitting: Sleep Medicine

## 2024-02-03 NOTE — Telephone Encounter (Signed)
 LVMTCB to schedule sleep consult.

## 2024-02-06 ENCOUNTER — Other Ambulatory Visit: Payer: Self-pay | Admitting: Family Medicine

## 2024-02-06 DIAGNOSIS — I10 Essential (primary) hypertension: Secondary | ICD-10-CM

## 2024-02-08 NOTE — Telephone Encounter (Signed)
 Requested medication (s) are due for refill today: na  Requested medication (s) are on the active medication list: yes  Last refill:  trazodone  - 01/02/24 #30 0 refills, hydrochlorothiazide - 01/13/24 #30 0 refills  Future visit scheduled: no   Notes to clinic:  protocol failed last labs 05/17/23. Last OV 02/02/24.   Pharmacy comment: REQUEST FOR 90 DAYS PRESCRIPTION. DX Code Needed.   Do you want to refill for #90?     Requested Prescriptions  Pending Prescriptions Disp Refills   traZODone  (DESYREL ) 50 MG tablet [Pharmacy Med Name: TRAZODONE  50 MG TABLET] 90 tablet 1    Sig: TAKE 1/2 TO 1 TABLET BY MOUTH AT BEDTIME AS NEEDED FOR SLEEP     Psychiatry: Antidepressants - Serotonin Modulator Passed - 02/08/2024  8:07 AM      Passed - Completed PHQ-2 or PHQ-9 in the last 360 days      Passed - Valid encounter within last 6 months    Recent Outpatient Visits           6 days ago Chronic fatigue   [facility] at [facility], Jason J, MD               hydrochlorothiazide  (HYDRODIURIL ) 25 MG tablet [Pharmacy Med Name: HYDROCHLOROTHIAZIDE  25 MG TAB] 90 tablet 1    Sig: TAKE 1 TABLET (25 MG TOTAL) BY MOUTH DAILY.     Cardiovascular: Diuretics - Thiazide Failed - 02/08/2024  8:07 AM      Failed - Cr in normal range and within 180 days    Creatinine  Date Value Ref Range Status  11/02/2013 0.59 (L) 0.60 - 1.30 mg/dL Final   Creatinine, Ser  Date Value Ref Range Status  05/17/2023 0.77 0.57 - 1.00 mg/dL Final         Failed - K in normal range and within 180 days    Potassium  Date Value Ref Range Status  05/17/2023 4.2 3.5 - 5.2 mmol/L Final  11/02/2013 4.2 3.5 - 5.1 mmol/L Final         Failed - Na in normal range and within 180 days    Sodium  Date Value Ref Range Status  05/17/2023 140 134 - 144 mmol/L Final  11/02/2013 137 136 - 145 mmol/L Final         Passed - Last BP in normal range    BP Readings from Last 1  Encounters:  02/02/24 98/70         Passed - Valid encounter within last 6 months    Recent Outpatient Visits           6 days ago Chronic fatigue   [facility] at [facility], Selinda PARAS, MD

## 2024-02-10 NOTE — Telephone Encounter (Signed)
 LVMTCB to schedule sleep consult.

## 2024-02-17 DIAGNOSIS — R5382 Chronic fatigue, unspecified: Secondary | ICD-10-CM | POA: Diagnosis not present

## 2024-02-17 DIAGNOSIS — D649 Anemia, unspecified: Secondary | ICD-10-CM | POA: Diagnosis not present

## 2024-02-17 DIAGNOSIS — E079 Disorder of thyroid, unspecified: Secondary | ICD-10-CM | POA: Diagnosis not present

## 2024-02-17 DIAGNOSIS — E785 Hyperlipidemia, unspecified: Secondary | ICD-10-CM | POA: Diagnosis not present

## 2024-02-18 ENCOUNTER — Ambulatory Visit: Payer: Self-pay | Admitting: Family Medicine

## 2024-02-18 LAB — COMPREHENSIVE METABOLIC PANEL WITH GFR
ALT: 8 IU/L (ref 0–32)
AST: 12 IU/L (ref 0–40)
Albumin: 4.6 g/dL (ref 3.9–4.9)
Alkaline Phosphatase: 64 IU/L (ref 44–121)
BUN/Creatinine Ratio: 18 (ref 9–23)
BUN: 14 mg/dL (ref 6–20)
Bilirubin Total: 0.3 mg/dL (ref 0.0–1.2)
CO2: 22 mmol/L (ref 20–29)
Calcium: 9.5 mg/dL (ref 8.7–10.2)
Chloride: 100 mmol/L (ref 96–106)
Creatinine, Ser: 0.76 mg/dL (ref 0.57–1.00)
Globulin, Total: 3 g/dL (ref 1.5–4.5)
Glucose: 108 mg/dL — ABNORMAL HIGH (ref 70–99)
Potassium: 4.3 mmol/L (ref 3.5–5.2)
Sodium: 136 mmol/L (ref 134–144)
Total Protein: 7.6 g/dL (ref 6.0–8.5)
eGFR: 103 mL/min/1.73 (ref >59)

## 2024-02-18 LAB — B12 AND FOLATE PANEL
Folate: >20 ng/mL (ref >3.0)
Vitamin B-12: 657 pg/mL (ref 232–1245)

## 2024-02-18 LAB — LIPID PANEL
Chol/HDL Ratio: 3.5 ratio (ref 0.0–4.4)
Cholesterol, Total: 276 mg/dL — ABNORMAL HIGH (ref 100–199)
HDL: 78 mg/dL (ref >39)
LDL Chol Calc (NIH): 159 mg/dL — ABNORMAL HIGH (ref 0–99)
Triglycerides: 214 mg/dL — ABNORMAL HIGH (ref 0–149)
VLDL Cholesterol Cal: 39 mg/dL (ref 5–40)

## 2024-02-18 LAB — CBC
Hematocrit: 40.6 % (ref 34.0–46.6)
Hemoglobin: 12.9 g/dL (ref 11.1–15.9)
MCH: 27.9 pg (ref 26.6–33.0)
MCHC: 31.8 g/dL (ref 31.5–35.7)
MCV: 88 fL (ref 79–97)
Platelets: 216 x10E3/uL (ref 150–450)
RBC: 4.62 x10E6/uL (ref 3.77–5.28)
RDW: 14.1 % (ref 11.7–15.4)
WBC: 7.2 x10E3/uL (ref 3.4–10.8)

## 2024-02-18 LAB — IRON,TIBC AND FERRITIN PANEL
Ferritin: 14 ng/mL — ABNORMAL LOW (ref 15–150)
Iron Saturation: 10 % — ABNORMAL LOW (ref 15–55)
Iron: 47 ug/dL (ref 27–159)
Total Iron Binding Capacity: 494 ug/dL — ABNORMAL HIGH (ref 250–450)
UIBC: 447 ug/dL — ABNORMAL HIGH (ref 131–425)

## 2024-02-18 LAB — TSH: TSH: 2.88 u[IU]/mL (ref 0.450–4.500)

## 2024-04-09 ENCOUNTER — Emergency Department

## 2024-04-09 ENCOUNTER — Emergency Department
Admission: EM | Admit: 2024-04-09 | Discharge: 2024-04-09 | Disposition: A | Discharge status: Home / Self Care | Attending: Emergency Medicine | Admitting: Emergency Medicine

## 2024-04-09 ENCOUNTER — Other Ambulatory Visit: Payer: Self-pay

## 2024-04-09 DIAGNOSIS — G44209 Tension-type headache, unspecified, not intractable: Secondary | ICD-10-CM | POA: Insufficient documentation

## 2024-04-09 DIAGNOSIS — R519 Headache, unspecified: Secondary | ICD-10-CM | POA: Diagnosis present

## 2024-04-09 DIAGNOSIS — M5412 Radiculopathy, cervical region: Secondary | ICD-10-CM | POA: Insufficient documentation

## 2024-04-09 DIAGNOSIS — E039 Hypothyroidism, unspecified: Secondary | ICD-10-CM | POA: Insufficient documentation

## 2024-04-09 LAB — URINALYSIS, ROUTINE W REFLEX MICROSCOPIC
Bilirubin Urine: NEGATIVE
Glucose, UA: NEGATIVE mg/dL
Ketones, ur: NEGATIVE mg/dL
Leukocytes,Ua: NEGATIVE
Nitrite: NEGATIVE
Protein, ur: NEGATIVE mg/dL
Specific Gravity, Urine: 1.015 (ref 1.005–1.030)
pH: 6 (ref 5.0–8.0)

## 2024-04-09 LAB — BASIC METABOLIC PANEL WITH GFR
Anion gap: 10 (ref 5–15)
BUN: 17 mg/dL (ref 6–20)
CO2: 24 mmol/L (ref 22–32)
Calcium: 8.6 mg/dL — ABNORMAL LOW (ref 8.9–10.3)
Chloride: 102 mmol/L (ref 98–111)
Creatinine, Ser: 0.83 mg/dL (ref 0.44–1.00)
GFR, Estimated: >60 mL/min (ref >60)
Glucose, Bld: 85 mg/dL (ref 70–99)
Potassium: 4 mmol/L (ref 3.5–5.1)
Sodium: 136 mmol/L (ref 135–145)

## 2024-04-09 LAB — CBC
HCT: 36 % (ref 36.0–46.0)
Hemoglobin: 12.1 g/dL (ref 12.0–15.0)
MCH: 28.4 pg (ref 26.0–34.0)
MCHC: 33.6 g/dL (ref 30.0–36.0)
MCV: 84.5 fL (ref 80.0–100.0)
Platelets: 138 K/uL — ABNORMAL LOW (ref 150–400)
RBC: 4.26 MIL/uL (ref 3.87–5.11)
RDW: 12.5 % (ref 11.5–15.5)
WBC: 4.8 K/uL (ref 4.0–10.5)
nRBC: 0 % (ref 0.0–0.2)

## 2024-04-09 LAB — TSH: TSH: 5.743 u[IU]/mL — ABNORMAL HIGH (ref 0.350–4.500)

## 2024-04-09 LAB — POC URINE PREG, ED: Preg Test, Ur: NEGATIVE

## 2024-04-09 MED ORDER — DIPHENHYDRAMINE HCL 50 MG/ML IJ SOLN
25.0000 mg | Freq: Once | INTRAMUSCULAR | Status: AC
  Administered 2024-04-09: 25 mg via INTRAVENOUS
  Filled 2024-04-09: qty 1

## 2024-04-09 MED ORDER — METOCLOPRAMIDE HCL 5 MG/ML IJ SOLN
20.0000 mg | Freq: Once | INTRAVENOUS | Status: AC
  Administered 2024-04-09: 20 mg via INTRAVENOUS
  Filled 2024-04-09: qty 4

## 2024-04-09 MED ORDER — PREDNISONE 50 MG PO TABS: 50.0000 mg | ORAL_TABLET | Freq: Every day | ORAL | 0 refills | Status: DC

## 2024-04-09 MED ORDER — KETOROLAC TROMETHAMINE 30 MG/ML IJ SOLN
30.0000 mg | Freq: Once | INTRAMUSCULAR | Status: AC
  Administered 2024-04-09: 30 mg via INTRAVENOUS
  Filled 2024-04-09: qty 1

## 2024-04-09 NOTE — ED Provider Notes (Signed)
 [facility] Provider Note    Event Date/Time   First MD Initiated Contact with Patient 04/09/24 [phone]     (approximate)   History   Headache   HPI  Maureen Hoffman is a 38 y.o. female with a history of Hashimoto's disease, hypothyroidism with left-sided cervical radiculopathy who presents with complaints of right sided headache which has been ongoing for about 3 days.  She reports this is atypical for her.  She complains of left arm abnormal sensation but normal strength which has been going on for several days as well.  She reports she has been told that she has a pinched nerve in the left arm.     Physical Exam   Triage Vital Signs: ED Triage Vitals [04/09/24 0847]  Encounter Vitals Group     BP (!) 138/98     Girls Systolic BP Percentile      Girls Diastolic BP Percentile      Boys Systolic BP Percentile      Boys Diastolic BP Percentile      Pulse Rate 76     Resp 18     Temp 98.2 F (36.8 C)     Temp src      SpO2 99 %     Weight 74.8 kg (165 lb)     Height 1.6 m (5' 3)     Head Circumference      Peak Flow      Pain Score 3     Pain Loc      Pain Education      Exclude from Growth Chart     Most recent vital signs: Vitals:   04/09/24 0847  BP: (!) 138/98  Pulse: 76  Resp: 18  Temp: 98.2 F (36.8 C)  SpO2: 99%     General: Awake, no distress.  CV:  Good peripheral perfusion.  Resp:  Normal effort.  Abd:  No distention.  Other:  Cranial nerves II through XII are normal, normal strength in all extremities, ambulating well without difficulty, no ataxia   ED Results / Procedures / Treatments   Labs (all labs ordered are listed, but only abnormal results are displayed) Labs Reviewed  CBC - Abnormal; Notable for the following components:      Result Value   Platelets 138 (*)    All other components within normal limits  BASIC METABOLIC PANEL WITH GFR - Abnormal; Notable for the following components:   Calcium 8.6 (*)     All other components within normal limits  URINALYSIS, ROUTINE W REFLEX MICROSCOPIC - Abnormal; Notable for the following components:   Color, Urine YELLOW (*)    APPearance CLEAR (*)    Hgb urine dipstick SMALL (*)    Bacteria, UA RARE (*)    All other components within normal limits  TSH - Abnormal; Notable for the following components:   TSH 5.743 (*)    All other components within normal limits  POC URINE PREG, ED     EKG     RADIOLOGY CT head viewed interpret by me, no acute abnormality, confirmed by radiology    PROCEDURES:  Critical Care performed:   Procedures   MEDICATIONS ORDERED IN ED: Medications  ketorolac  (TORADOL ) 30 MG/ML injection 30 mg (30 mg Intravenous Given 04/09/24 1028)  metoCLOPramide  (REGLAN ) 20 mg in dextrose 5 % 50 mL IVPB (0 mg Intravenous Stopped 04/09/24 1046)  diphenhydrAMINE  (BENADRYL ) injection 25 mg (25 mg Intravenous Given 04/09/24 1028)  IMPRESSION / MDM / ASSESSMENT AND PLAN / ED COURSE  I reviewed the triage vital signs and the nursing notes. Patient's presentation is most consistent with acute presentation with potential threat to life or bodily function.  Patient presents with worsening headache over the last 3 days, no history of migraines, differential includes migraine headache, tension headache, less likely ICH given reassuring neuroexam.  Suspect cervical radiculopathy as a cause of her abnormal sensation in the left arm, will treat headache here with Toradol , Reglan , Benadryl   ----------------------------------------- 12:14 PM on 04/09/2024 ----------------------------------------- CT head is reassuring, patient reports headache has resolved after treatment.  Elevated TSH noted, she sees endocrinology and she will contact them.  Appropriate for discharge at this time, will refer to neurosurgery for evaluation of cervical radiculopathy, she agrees with this plan        FINAL CLINICAL IMPRESSION(S) / ED DIAGNOSES    Final diagnoses:  Acute non intractable tension-type headache  Cervical radiculopathy     Rx / DC Orders   ED Discharge Orders          Ordered    predniSONE  (DELTASONE ) 50 MG tablet  Daily with breakfast        04/09/24 1208             Note:  This document was prepared using Dragon voice recognition software and may include unintentional dictation errors.   Arlander Charleston, MD 04/09/24 1214

## 2024-04-09 NOTE — ED Triage Notes (Signed)
 ARrives c/o headache, neck pain and left arm pain x 3 days.  AAOx3. Skin warm and dry. NAD. Ambulates with easy and steady gait. Posture upright and relaxed.

## 2024-04-09 NOTE — ED Triage Notes (Addendum)
 Pt comes with headache for three days. Pt stats some neck pain and throat. Pt states she had felt fatigued for 3 months now. Pt also states 30 lb weight gain. Pt states she just doesn't know what is wrong.  Pt denies any belly pain, N/V/D.

## 2024-04-12 DIAGNOSIS — F331 Major depressive disorder, recurrent, moderate: Secondary | ICD-10-CM | POA: Diagnosis not present

## 2024-05-15 DIAGNOSIS — R21 Rash and other nonspecific skin eruption: Secondary | ICD-10-CM | POA: Diagnosis not present

## 2024-05-18 DIAGNOSIS — F331 Major depressive disorder, recurrent, moderate: Secondary | ICD-10-CM | POA: Diagnosis not present

## 2024-05-22 ENCOUNTER — Ambulatory Visit: Payer: Self-pay | Admitting: Emergency Medicine

## 2024-05-22 ENCOUNTER — Ambulatory Visit (INDEPENDENT_AMBULATORY_CARE_PROVIDER_SITE_OTHER)

## 2024-05-22 ENCOUNTER — Other Ambulatory Visit: Payer: Self-pay

## 2024-05-22 ENCOUNTER — Ambulatory Visit
Admission: EM | Admit: 2024-05-22 | Discharge: 2024-05-22 | Disposition: A | Discharge status: Home / Self Care | Attending: Emergency Medicine | Admitting: Emergency Medicine

## 2024-05-22 ENCOUNTER — Telehealth: Payer: Self-pay

## 2024-05-22 DIAGNOSIS — R079 Chest pain, unspecified: Secondary | ICD-10-CM | POA: Insufficient documentation

## 2024-05-22 DIAGNOSIS — J020 Streptococcal pharyngitis: Secondary | ICD-10-CM | POA: Insufficient documentation

## 2024-05-22 DIAGNOSIS — R509 Fever, unspecified: Secondary | ICD-10-CM | POA: Diagnosis not present

## 2024-05-22 DIAGNOSIS — J029 Acute pharyngitis, unspecified: Secondary | ICD-10-CM | POA: Diagnosis not present

## 2024-05-22 LAB — RESP PANEL BY RT-PCR (FLU A&B, COVID) ARPGX2
Influenza A by PCR: NEGATIVE
Influenza B by PCR: NEGATIVE
SARS Coronavirus 2 by RT PCR: NEGATIVE

## 2024-05-22 LAB — GROUP A STREP BY PCR: Group A Strep by PCR: DETECTED — AB

## 2024-05-22 MED ORDER — ALUM & MAG HYDROXIDE-SIMETH 200-200-20 MG/5ML PO SUSP
5.0000 mL | Freq: Once | ORAL | Status: AC
  Administered 2024-05-22: 5 mL via ORAL

## 2024-05-22 MED ORDER — PENICILLIN V POTASSIUM 500 MG PO TABS: 500.0000 mg | ORAL_TABLET | Freq: Two times a day (BID) | ORAL | 0 refills | Status: AC

## 2024-05-22 MED ORDER — IBUPROFEN 600 MG PO TABS
600.0000 mg | ORAL_TABLET | Freq: Four times a day (QID) | ORAL | 0 refills | Status: AC | PRN
Start: 2024-05-22 — End: ?

## 2024-05-22 MED ORDER — DIPHENHYDRAMINE HCL 12.5 MG/5ML PO ELIX
12.5000 mg | ORAL_SOLUTION | Freq: Once | ORAL | Status: AC
  Administered 2024-05-22: 12.5 mg via ORAL

## 2024-05-22 NOTE — ED Provider Notes (Signed)
 HPI  SUBJECTIVE:  Maureen Hoffman is a 38 y.o. female who presents with bilateral ear pain, sore throat, fevers Tmax 102, body aches, headaches, postnasal drip, nausea starting yesterday.  She reports shortness of breath and substernal chest pain pressure/shortness starting last night that is starting to resolve.  She states she feels as if she needs to belch.  It does not radiate up her neck, down her arm or through to her back.  There is no exertional component.  It is worse with lying down.  She denies coughing, wheezing, nasal congestion, rhinorrhea, sinus pain or pressure, change in hearing, otorrhea, vomiting, diarrhea, abdominal pain, rash.  No neck stiffness, trismus, muffled voice, sensation of throat swelling shut.  No waterbrash.  She has tried ibuprofen  800 mg and DayQuil with improvement in her symptoms.  Symptoms worse with talking and swallowing.  No known COVID, flu, strep, mono exposure.  She got 1 dose of the COVID-vaccine.  She did not get this years flu vaccine.  No antibiotics in the past month. She has a past medical history of seizures as a child, thrombocytopenia in pregnancy, Hashimoto's with acquired hypothyroidism, GERD.  She does not take any medications for this.  LMP: 3 weeks ago.  Denies possibility of being pregnant.  PCP: [facility].  Past Medical History:  Diagnosis Date   Hashimoto's disease 2020   Is now on Synthroid  although she still apparently has antibodies   Hypothyroidism (acquired)    Mood swings    Seizures (HCC)    as a child   Thrombocytopenia affecting pregnancy, antepartum 12/03/2016   Thrombocytopenia complicating pregnancy    2018    Past Surgical History:  Procedure Laterality Date   NO PAST SURGERIES      Family History  Problem Relation Age of Onset   Healthy Mother    Colon cancer Father    Hashimoto's thyroiditis Sister    Colon cancer Paternal Uncle    Breast cancer Maternal Grandmother    Diabetes Maternal Grandmother     Breast cancer Paternal Grandmother    COPD Paternal Grandmother    Brain cancer Paternal Grandfather     Social History   Tobacco Use   Smoking status: Former    Current packs/day: 0.00    Types: Cigarettes    Quit date: 07/13/2010    Years since quitting: 13.8   Smokeless tobacco: Never  Vaping Use   Vaping status: Never Used  Substance Use Topics   Alcohol use: Yes    Comment: rare   Drug use: No    No current facility-administered medications for this encounter.  Current Outpatient Medications:    ibuprofen  (ADVIL ) 600 MG tablet, Take 1 tablet (600 mg total) by mouth every 6 (six) hours as needed., Disp: 30 tablet, Rfl: 0   penicillin  v potassium (VEETID) 500 MG tablet, Take 1 tablet (500 mg total) by mouth 2 (two) times daily for 10 days. X 10 days, Disp: 20 tablet, Rfl: 0   Vitamin D , Ergocalciferol , (DRISDOL) 1.25 MG (50000 UNIT) CAPS capsule, Take 50,000 Units by mouth once a week., Disp: , Rfl:    fluticasone  (FLONASE ) 50 MCG/ACT nasal spray, SPRAY 2 SPRAYS INTO EACH NOSTRIL EVERY DAY, Disp: 16 mL, Rfl: 0   hydrochlorothiazide  (HYDRODIURIL ) 25 MG tablet, TAKE 1 TABLET (25 MG TOTAL) BY MOUTH DAILY., Disp: 90 tablet, Rfl: 1   lamoTRIgine (LAMICTAL) 25 MG tablet, Take by mouth., Disp: , Rfl:    levothyroxine  (SYNTHROID ) 50 MCG tablet,  Take 1 tablet (50 mcg total) by mouth daily., Disp: 90 tablet, Rfl: 1   minoxidil (LONITEN) 2.5 MG tablet, Take by mouth., Disp: , Rfl:    norgestimate -ethinyl estradiol  (ORTHO-CYCLEN) 0.25-35 MG-MCG tablet, TAKE 1 TABLET BY MOUTH EVERY DAY, Disp: 84 tablet, Rfl: 2   sertraline  (ZOLOFT ) 50 MG tablet, Take 50 mg by mouth every morning., Disp: , Rfl:    traZODone  (DESYREL ) 50 MG tablet, TAKE 1/2 TO 1 TABLET BY MOUTH AT BEDTIME AS NEEDED FOR SLEEP, Disp: 90 tablet, Rfl: 1  No Known Allergies   ROS  As noted in HPI.   Physical Exam  BP 127/81 (BP Location: Left Arm)   Pulse 96   Temp 99.1 F (37.3 C) (Oral)   Resp 14   Ht 5' 3  (1.6 m)   Wt 77.1 kg   LMP 05/01/2024 (Approximate)   SpO2 99%   BMI 30.11 kg/m   Constitutional: Well developed, well nourished, no acute distress.  Appears ill. Eyes: PERRL, EOMI, conjunctiva normal bilaterally HENT: Normocephalic, atraumatic,mucus membranes moist.  Slightly decreased hearing right ear compared to the left.  External ear, EAC, TMs normal bilaterally.  No pain with traction of pinna, palpation of tragus or mastoid bilaterally.  No nasal congestion.  No maxillary, frontal sinus tenderness.  Intensely erythematous oropharynx.  Tonsils normal size with extensive exudates.SABRA  Uvula midline.  No drooling, trismus, muffled voice, neck stiffness. Neck: Positive cervical lymphadenopathy Respiratory: Clear to auscultation bilaterally, no rales, no wheezing, no rhonchi.  Positive anterior, lateral chest wall tenderness Cardiovascular: Normal rate and rhythm, no murmurs, no gallops, no rubs GI: nondistended, soft, no splenomegaly skin: No rash, skin intact Musculoskeletal: no deformities Neurologic: Alert & oriented x 3, CN III-XII grossly intact, no motor deficits, sensation grossly intact Psychiatric: Speech and behavior appropriate   ED Course   Medications  diphenhydrAMINE  (BENADRYL ) 12.5 MG/5ML elixir 12.5 mg (12.5 mg Oral Given 05/22/24 1251)  alum & mag hydroxide-simeth (MAALOX/MYLANTA) 200-200-20 MG/5ML suspension 5 mL (5 mLs Oral Given 05/22/24 1251)    Orders Placed This Encounter  Procedures   Group A Strep by PCR    Standing Status:   Standing    Number of Occurrences:   1   Resp Panel by RT-PCR (Flu A&B, Covid) Anterior Nasal Swab    Standing Status:   Standing    Number of Occurrences:   1   DG Chest 2 View    Standing Status:   Standing    Number of Occurrences:   1    Reason for Exam (SYMPTOM  OR DIAGNOSIS REQUIRED):   Fever, chest pain rule out acute cardiopulmonary disease.   ED EKG    Standing Status:   Standing    Number of Occurrences:   1     Reason for Exam:   Chest Pain   Results for orders placed or performed during the hospital encounter of 05/22/24 (from the past 24 hours)  Group A Strep by PCR     Status: Abnormal   Collection Time: 05/22/24 11:35 AM   Specimen: Throat; Sterile Swab  Result Value Ref Range   Group A Strep by PCR DETECTED (A) NOT DETECTED  Resp Panel by RT-PCR (Flu A&B, Covid) Anterior Nasal Swab     Status: None   Collection Time: 05/22/24 11:35 AM   Specimen: Anterior Nasal Swab  Result Value Ref Range   SARS Coronavirus 2 by RT PCR NEGATIVE NEGATIVE   Influenza A by PCR NEGATIVE NEGATIVE  Influenza B by PCR NEGATIVE NEGATIVE   DG Chest 2 View Result Date: 05/22/2024 CLINICAL DATA:  Fever with sore throat and ear pain x1 day. EXAM: CHEST - 2 VIEW COMPARISON:  February 15, 2011 FINDINGS: The heart size and mediastinal contours are within normal limits. Both lungs are clear. The visualized skeletal structures are unremarkable. IMPRESSION: No active cardiopulmonary disease. Electronically Signed   By: Suzen Dials M.D.   On: 05/22/2024 13:30    ED Clinical Impression  1. Strep pharyngitis   2. Chest pain, unspecified type      ED Assessment/Plan    Patient declined injection of dexamethasone  here.  Checking EKG due to reports of chest pain to evaluate for pericarditis.  Myocarditis lower the differential since symptoms started yesterday.  Will also check chest x-ray due to fever, chest pain.  EKG: Normal sinus rhythm, rate 100.  Left axis deviation.  Normal intervals.  No hypertrophy.  No ST-T wave changes.  No changes compared to EKG from 4/24.  Reviewed imaging independently.  No obvious pneumonia as read by me.  Normal mediastinum.  No pneumothorax.  Pulmonary edema.  Formal radiology over read pending.  Will contact patient if overread differs enough from mine and we needed to change management.  Reviewed radiology report.  No acute cardiopulmonary disease consistent with my read.  See  radiology report for details.  Will give 5 mL of liquid Benadryl  mixed with 5 mL of Maalox here for throat pain.  Strep positive.  COVID, flu negative. Discussed all results with patient while in department.  No evidence of otitis media.  EKG, chest x-ray unremarkable.  Doubt pneumonia, dissection, pneumothorax, CHF, pericarditis.  Patient has strep pharyngitis.  May have a component of GERD causing her chest pain.  Will send home with penicillin  500 mg p.o. twice daily for 10 days, ibuprofen , or Tylenol  3-4 times a day, Benadryl /Maalox mixture.    Discussed labs, imaging, MDM, treatment plan, and plan for follow-up with patient. Discussed sn/sx that should prompt return to the ED. patient agrees with plan.   Meds ordered this encounter  Medications   diphenhydrAMINE  (BENADRYL ) 12.5 MG/5ML elixir 12.5 mg   alum & mag hydroxide-simeth (MAALOX/MYLANTA) 200-200-20 MG/5ML suspension 5 mL   penicillin  v potassium (VEETID) 500 MG tablet    Sig: Take 1 tablet (500 mg total) by mouth 2 (two) times daily for 10 days. X 10 days    Dispense:  20 tablet    Refill:  0   ibuprofen  (ADVIL ) 600 MG tablet    Sig: Take 1 tablet (600 mg total) by mouth every 6 (six) hours as needed.    Dispense:  30 tablet    Refill:  0      *This clinic note was created using Scientist, clinical (histocompatibility and immunogenetics). Therefore, there may be occasional mistakes despite careful proofreading. ?    Van Knee, MD 05/22/24 [phone]

## 2024-05-22 NOTE — ED Triage Notes (Signed)
 Pt c/o sore throat,fever & ear pain x1 day. Tmax 102 this morning. Has tried OTC meds w/o relief.

## 2024-05-22 NOTE — Discharge Instructions (Addendum)
 Strep PCR is positive.  COVID, flu PCR negative.  EKG was unchanged compared to EKG from April 2024.  I did not appreciate any obvious pneumonia on your x-ray.  We will contact you if the radiology overread differs from mine and we need to change management.  Finish the penicillin , even if you feel better.  You will need to throw away your toothbrush when you finish the antibiotics.  1 gram of Tylenol  and 600 mg ibuprofen  together 3-4 times a day as needed for pain.  Make sure you drink plenty of extra fluids.  Some people find salt water gargles and  Traditional Medicinal's Throat Coat tea helpful. Take 5 mL of liquid Benadryl  and 5 mL of Maalox/Mylanta. Mix it together, and then hold it in your mouth for as long as you can and then swallow. You may do this 4 times a day.  Honey and lemon dissolved in hot water can also be soothing.  Go to www.goodrx.com  or www.costplusdrugs.com to look up your medications. This will give you a list of where you can find your prescriptions at the most affordable prices. Or ask the pharmacist what the cash price is, or if they have any other discount programs available to help make your medication more affordable. This can be less expensive than what you would pay with insurance.

## 2024-05-22 NOTE — Telephone Encounter (Signed)
 Error

## 2024-05-30 ENCOUNTER — Other Ambulatory Visit: Payer: Self-pay | Admitting: Family Medicine

## 2024-05-31 ENCOUNTER — Encounter: Payer: Self-pay | Admitting: Family Medicine

## 2024-06-01 ENCOUNTER — Other Ambulatory Visit: Payer: Self-pay

## 2024-06-01 DIAGNOSIS — Z789 Other specified health status: Secondary | ICD-10-CM

## 2024-06-01 MED ORDER — NORGESTIMATE-ETH ESTRADIOL 0.25-35 MG-MCG PO TABS: 1.0000 | ORAL_TABLET | Freq: Every day | ORAL | 2 refills | Status: AC

## 2024-06-01 NOTE — Telephone Encounter (Signed)
 Duplicate request, refilled 06/01/24.  Requested Prescriptions  Pending Prescriptions Disp Refills   norgestimate -ethinyl estradiol  (ORTHO-CYCLEN) 0.25-35 MG-MCG tablet [Pharmacy Med Name: NORG-ETHIN ESTRA 0.25-0.035 MG] 84 tablet 2    Sig: TAKE 1 TABLET BY MOUTH EVERY DAY     OB/GYN:  Contraceptives Passed - 06/01/2024 11:20 AM      Passed - Last BP in normal range    BP Readings from Last 1 Encounters:  05/22/24 127/81         Passed - Valid encounter within last 12 months    Recent Outpatient Visits           4 months ago Chronic fatigue   [facility] at [facility], Selinda PARAS, MD              Passed - Patient is not a smoker
